# Patient Record
Sex: Female | Born: 2000 | Race: Black or African American | Hispanic: No | Marital: Single | Smoking: Former smoker
Health system: Southern US, Community
[De-identification: ages and names within clinical notes are randomized; demographics above are authoritative.]

## PROBLEM LIST (undated history)

## (undated) DIAGNOSIS — E669 Obesity, unspecified: Secondary | ICD-10-CM

## (undated) DIAGNOSIS — Z789 Other specified health status: Secondary | ICD-10-CM

## (undated) HISTORY — PX: CHOLECYSTECTOMY: SHX55

---
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
## 2020-01-10 ENCOUNTER — Emergency Department: Payer: Medicaid Other

## 2020-01-10 ENCOUNTER — Other Ambulatory Visit: Payer: Self-pay

## 2020-01-10 ENCOUNTER — Emergency Department
Admission: EM | Admit: 2020-01-10 | Discharge: 2020-01-11 | Disposition: A | Discharge status: Home / Self Care | Payer: Medicaid Other | Attending: Emergency Medicine | Admitting: Emergency Medicine

## 2020-01-10 ENCOUNTER — Encounter: Payer: Self-pay | Admitting: Emergency Medicine

## 2020-01-10 DIAGNOSIS — O219 Vomiting of pregnancy, unspecified: Secondary | ICD-10-CM

## 2020-01-10 DIAGNOSIS — K209 Esophagitis, unspecified without bleeding: Secondary | ICD-10-CM | POA: Insufficient documentation

## 2020-01-10 DIAGNOSIS — R112 Nausea with vomiting, unspecified: Secondary | ICD-10-CM | POA: Insufficient documentation

## 2020-01-10 DIAGNOSIS — R0789 Other chest pain: Secondary | ICD-10-CM | POA: Diagnosis not present

## 2020-01-10 DIAGNOSIS — N3001 Acute cystitis with hematuria: Secondary | ICD-10-CM | POA: Diagnosis not present

## 2020-01-10 LAB — URINALYSIS, COMPLETE (UACMP) WITH MICROSCOPIC
Glucose, UA: NEGATIVE mg/dL
Hgb urine dipstick: NEGATIVE
Ketones, ur: NEGATIVE mg/dL
Nitrite: NEGATIVE
Protein, ur: >=300 mg/dL — AB
Specific Gravity, Urine: 1.025 (ref 1.005–1.030)
pH: 8 (ref 5.0–8.0)

## 2020-01-10 LAB — BASIC METABOLIC PANEL
Anion gap: 14 (ref 5–15)
BUN: 10 mg/dL (ref 6–20)
CO2: 19 mmol/L — ABNORMAL LOW (ref 22–32)
Calcium: 9.8 mg/dL (ref 8.9–10.3)
Chloride: 103 mmol/L (ref 98–111)
Creatinine, Ser: 0.68 mg/dL (ref 0.44–1.00)
GFR calc Af Amer: >60 mL/min (ref >60)
GFR calc non Af Amer: >60 mL/min (ref >60)
Glucose, Bld: 96 mg/dL (ref 70–99)
Potassium: 3 mmol/L — ABNORMAL LOW (ref 3.5–5.1)
Sodium: 136 mmol/L (ref 135–145)

## 2020-01-10 LAB — CBC
HCT: 36.7 % (ref 36.0–46.0)
Hemoglobin: 12.4 g/dL (ref 12.0–15.0)
MCH: 29.5 pg (ref 26.0–34.0)
MCHC: 33.8 g/dL (ref 30.0–36.0)
MCV: 87.4 fL (ref 80.0–100.0)
Platelets: 367 10*3/uL (ref 150–400)
RBC: 4.2 MIL/uL (ref 3.87–5.11)
RDW: 13.5 % (ref 11.5–15.5)
WBC: 9.4 10*3/uL (ref 4.0–10.5)
nRBC: 0 % (ref 0.0–0.2)

## 2020-01-10 LAB — HCG, QUANTITATIVE, PREGNANCY: hCG, Beta Chain, Quant, S: 76029 m[IU]/mL — ABNORMAL HIGH (ref <5)

## 2020-01-10 LAB — TROPONIN I (HIGH SENSITIVITY)
Troponin I (High Sensitivity): <2 ng/L (ref <18)
Troponin I (High Sensitivity): 4 ng/L (ref <18)

## 2020-01-10 MED ORDER — DOXYLAMINE-PYRIDOXINE 10-10 MG PO TBEC: DELAYED_RELEASE_TABLET | ORAL | 0 refills | Status: DC

## 2020-01-10 MED ORDER — ACETAMINOPHEN 500 MG PO TABS
1000.0000 mg | ORAL_TABLET | Freq: Once | ORAL | Status: AC
  Administered 2020-01-10: 1000 mg via ORAL
  Filled 2020-01-10: qty 2

## 2020-01-10 MED ORDER — SODIUM CHLORIDE 0.9% FLUSH
3.0000 mL | Freq: Once | INTRAVENOUS | Status: AC
  Administered 2020-01-10: 23:00:00 3 mL via INTRAVENOUS

## 2020-01-10 MED ORDER — PROMETHAZINE HCL 25 MG/ML IJ SOLN
25.0000 mg | Freq: Once | INTRAMUSCULAR | Status: AC
  Administered 2020-01-10: 23:00:00 25 mg via INTRAVENOUS
  Filled 2020-01-10: qty 1

## 2020-01-10 MED ORDER — ALUM & MAG HYDROXIDE-SIMETH 200-200-20 MG/5ML PO SUSP
30.0000 mL | Freq: Once | ORAL | Status: AC
  Administered 2020-01-10: 23:00:00 30 mL via ORAL
  Filled 2020-01-10: qty 30

## 2020-01-10 MED ORDER — POTASSIUM CHLORIDE ER 10 MEQ PO TBCR: 20.0000 meq | EXTENDED_RELEASE_TABLET | Freq: Every day | ORAL | 0 refills | Status: DC

## 2020-01-10 MED ORDER — LACTATED RINGERS IV BOLUS: 1000.0000 mL | Freq: Once | INTRAVENOUS | Status: DC

## 2020-01-10 MED ORDER — CEPHALEXIN 500 MG PO CAPS: 500.0000 mg | ORAL_CAPSULE | Freq: Four times a day (QID) | ORAL | 0 refills | Status: AC

## 2020-01-10 MED ORDER — SODIUM CHLORIDE 0.9 % IV SOLN
2.0000 g | Freq: Once | INTRAVENOUS | Status: AC
  Administered 2020-01-10: 23:00:00 2 g via INTRAVENOUS
  Filled 2020-01-10: qty 20

## 2020-01-10 MED ORDER — PANTOPRAZOLE SODIUM 40 MG PO TBEC: 40.0000 mg | DELAYED_RELEASE_TABLET | Freq: Every day | ORAL | 0 refills | Status: DC

## 2020-01-10 MED ORDER — LACTATED RINGERS IV BOLUS
2000.0000 mL | Freq: Once | INTRAVENOUS | Status: AC
  Administered 2020-01-10: 23:00:00 2000 mL via INTRAVENOUS

## 2020-01-10 MED ORDER — LIDOCAINE VISCOUS HCL 2 % MT SOLN
15.0000 mL | Freq: Once | OROMUCOSAL | Status: AC
  Administered 2020-01-10: 23:00:00 15 mL via ORAL
  Filled 2020-01-10: qty 15

## 2020-01-10 MED ORDER — PANTOPRAZOLE SODIUM 40 MG IV SOLR
40.0000 mg | Freq: Once | INTRAVENOUS | Status: AC
  Administered 2020-01-11: 40 mg via INTRAVENOUS
  Filled 2020-01-10: qty 40

## 2020-01-10 NOTE — Discharge Instructions (Signed)
Take the prescribed antacids and anti-nausea medication  Try over-the-counter Tums or Rolaids first for chest pain.  You can take tylenol 1000 mg every 6 hours for pain as well.  Try to eat frequent, small meals. Avoid foods high in acid.

## 2020-01-10 NOTE — ED Triage Notes (Signed)
Pt to ED from home c/o mid chest pain for a couple days non radiating, SOB, nausea and vomiting x4 today.  Pt states [redacted] weeks pregnant with first pregnancy.  A&Ox4, chest rise even and unlabored, in NAD at this time.

## 2020-01-10 NOTE — ED Notes (Signed)
Patient states she experiences indigestion and has taken multiple different treatments for this while at home with no relief. Patient states this causes N/V and SOB. Reports limited and decreased intake for the last several days. Patient states that her urine is darker than normal. Denies any complications or complaints with her pregnancy

## 2020-01-10 NOTE — ED Notes (Signed)
Attempted fetal heart tones in triage.

## 2020-01-10 NOTE — ED Provider Notes (Signed)
[facility] Emergency Department Provider Note  ____________________________________________   First MD Initiated Contact with Patient 01/10/20 2153     (approximate)  I have reviewed the triage vital signs and the nursing notes.   HISTORY  Chief Complaint Chest Pain    HPI Joanne Pacheco is a 19 y.o. female , G1P0 at estimated [redacted] wk GA here with nausea, vomiting, Cp. Pt reports that for the past several weeks, she's had persistent, severe nausea and vomiting. For the past few days, she's had worsening n/v and has subsequently developed an aching, burning substernal and epigastric discomfort that is worse with vomiting and movement. She has had difficulty keeping food down due to this. She has not had any leg swelling. No h/o DVT or PE. She also feels like her CP is worse lyign down and bending over, and that it seems worse when she is working.        History reviewed. No pertinent past medical history.  There are no problems to display for this patient.   History reviewed. No pertinent surgical history.  Prior to Admission medications   Not on File    Allergies Patient has no known allergies.  History reviewed. No pertinent family history.  Social History Social History   Tobacco Use  . Smoking status: Never Smoker  . Smokeless tobacco: Never Used  Substance Use Topics  . Alcohol use: Never  . Drug use: Never    Review of Systems  Review of Systems  Constitutional: Positive for fatigue. Negative for fever.  HENT: Negative for congestion and sore throat.   Eyes: Negative for visual disturbance.  Respiratory: Positive for chest tightness. Negative for cough and shortness of breath.   Cardiovascular: Positive for chest pain.  Gastrointestinal: Positive for nausea and vomiting. Negative for abdominal pain and diarrhea.  Genitourinary: Negative for flank pain.  Musculoskeletal: Negative for back pain and neck pain.  Skin: Negative for  rash and wound.  Neurological: Positive for weakness.  All other systems reviewed and are negative.    ____________________________________________  PHYSICAL EXAM:      VITAL SIGNS: ED Triage Vitals [01/10/20 1916]  Enc Vitals Group     BP (!) 137/93     Pulse Rate 91     Resp 18     Temp 98.8 F (37.1 C)     Temp Source Oral     SpO2 99 %     Weight 286 lb (129.7 kg)     Height 5\' 6"  (1.676 m)     Head Circumference      Peak Flow      Pain Score 5     Pain Loc      Pain Edu?      Excl. in Sac?      Physical Exam Vitals and nursing note reviewed.  Constitutional:      General: She is not in acute distress.    Appearance: She is well-developed.  HENT:     Head: Normocephalic and atraumatic.     Comments: Dry MM Eyes:     Conjunctiva/sclera: Conjunctivae normal.  Cardiovascular:     Rate and Rhythm: Normal rate and regular rhythm.     Heart sounds: Normal heart sounds.  Pulmonary:     Effort: Pulmonary effort is normal. No respiratory distress.     Breath sounds: No wheezing.  Abdominal:     General: Abdomen is flat. There is no distension.     Tenderness: There is  abdominal tenderness (minimal epigastric, no RUQ TTP).  Musculoskeletal:     Cervical back: Neck supple.  Skin:    General: Skin is warm.     Capillary Refill: Capillary refill takes less than 2 seconds.     Findings: No rash.  Neurological:     Mental Status: She is alert and oriented to person, place, and time.     Motor: No abnormal muscle tone.       ____________________________________________   LABS (all labs ordered are listed, but only abnormal results are displayed)  Labs Reviewed  BASIC METABOLIC PANEL - Abnormal; Notable for the following components:      Result Value   Potassium 3.0 (*)    CO2 19 (*)    All other components within normal limits  URINALYSIS, COMPLETE (UACMP) WITH MICROSCOPIC - Abnormal; Notable for the following components:   Color, Urine AMBER (*)     APPearance TURBID (*)    Bilirubin Urine SMALL (*)    Protein, ur >=300 (*)    Leukocytes,Ua SMALL (*)    Bacteria, UA MANY (*)    All other components within normal limits  HCG, QUANTITATIVE, PREGNANCY - Abnormal; Notable for the following components:   hCG, Beta Chain, Quant, S 76,029 (*)    All other components within normal limits  CBC  TROPONIN I (HIGH SENSITIVITY)  TROPONIN I (HIGH SENSITIVITY)    ____________________________________________  EKG: Normal sinus rhythm, VR 82. PR 132, QRS 90, QTc 406. No acute St elevations or depressions. ________________________________________  RADIOLOGY All imaging, including plain films, CT scans, and ultrasounds, independently reviewed by me, and interpretations confirmed via formal radiology reads.  ED MD interpretation:   CXR: Clear  Official radiology report(s): DG Chest 2 View  Result Date: 01/10/2020 CLINICAL DATA:  Mid chest pain for several days, nonradiating, short of breath, nausea and vomiting, [redacted] weeks pregnant EXAM: CHEST - 2 VIEW COMPARISON:  None. FINDINGS: The heart size and mediastinal contours are within normal limits. Both lungs are clear. The visualized skeletal structures are unremarkable. IMPRESSION: No active cardiopulmonary disease. Electronically Signed   By: Sharlet Salina M.D.   On: 01/10/2020 19:59    ____________________________________________  PROCEDURES   Procedure(s) performed (including Critical Care):  Procedures  ____________________________________________  INITIAL IMPRESSION / MDM / ASSESSMENT AND PLAN / ED COURSE  As part of my medical decision making, I reviewed the following data within the electronic MEDICAL RECORD NUMBER Nursing notes reviewed and incorporated, Old chart reviewed, Notes from prior ED visits, and [place] Controlled Substance Database       *Joanne Pacheco was evaluated in Emergency Department on 01/10/2020 for the symptoms described in the history of present illness. She was  evaluated in the context of the global COVID-19 pandemic, which necessitated consideration that the patient might be at risk for infection with the SARS-CoV-2 virus that causes COVID-19. Institutional protocols and algorithms that pertain to the evaluation of patients at risk for COVID-19 are in a state of rapid change based on information released by regulatory bodies including the CDC and federal and state organizations. These policies and algorithms were followed during the patient's care in the ED.  Some ED evaluations and interventions may be delayed as a result of limited staffing during the pandemic.*     Medical Decision Making:  19 yo F here with epigastric and substernal CP, nausea, vomiting. Suspect gastritis/esophagitis in setting of persistent nausea/vomiting of pregnancy. Hgb is stable, she denies any overt hematemesis and I see  no signs to suggest MW tear or PUD. Her EKG is non-ischemic and trop neg x 2 despite constant sx, with no personal or family h/o early CAD - do not suspect ACS. She has no LE edema, calf pain, tachycardia, tachypnea, hypoxia, or signs of DVT/PE.  Will treat with antacids, antiemetics, and fluids. Of note, pt had reported darkened urine and UA is c/f UTI. Given +pregnancy, wil tx with Rocephin and outpt Keflex. No CVAT or signs of pyelo.  Plan to reassess after fluids, symptom control. Likely d/c with outpt follow-up.  ____________________________________________  FINAL CLINICAL IMPRESSION(S) / ED DIAGNOSES  Final diagnoses:  Nausea/vomiting in pregnancy  Atypical chest pain  Esophagitis  Acute cystitis with hematuria     MEDICATIONS GIVEN DURING THIS VISIT:  Medications  cefTRIAXone (ROCEPHIN) 2 g in sodium chloride 0.9 % 100 mL IVPB (2 g Intravenous New Bag/Given 01/10/20 2307)  acetaminophen (TYLENOL) tablet 1,000 mg (has no administration in time range)  sodium chloride flush (NS) 0.9 % injection 3 mL (3 mLs Intravenous Given 01/10/20 2250)  alum &  mag hydroxide-simeth (MAALOX/MYLANTA) 200-200-20 MG/5ML suspension 30 mL (30 mLs Oral Given 01/10/20 2304)    And  lidocaine (XYLOCAINE) 2 % viscous mouth solution 15 mL (15 mLs Oral Given 01/10/20 2304)  promethazine (PHENERGAN) injection 25 mg (25 mg Intravenous Given 01/10/20 2303)  lactated ringers bolus 2,000 mL (2,000 mLs Intravenous Other (enter comment in med admin window) 01/10/20 2254)     ED Discharge Orders    None       Note:  This document was prepared using Dragon voice recognition software and may include unintentional dictation errors.   Shaune Pollack, MD 01/10/20 [phone]

## 2020-01-11 NOTE — ED Notes (Signed)
Patient resting in bed, states she still does not feel well. Patient requests lights dimmed for rest. Call light in reach, will continue to monitor.

## 2020-01-11 NOTE — ED Notes (Signed)
Patient resting quietly in bed at this time. Patient has approx 200cc of LR remaining in bag at this time

## 2020-01-11 NOTE — ED Provider Notes (Signed)
-----------------------------------------   1:56 AM on 01/11/2020 -----------------------------------------   Blood pressure 126/73, pulse (!) 57, temperature 98.1 F (36.7 C), temperature source Oral, resp. rate (!) 23, height 5\' 6"  (1.676 m), weight 129.7 kg, SpO2 100 %.  Assuming care from Dr. of Shauntel Prest is a 19 y.o. female with a chief complaint of Chest Pain .    Please refer to H&P by previous MD for further details.  The current plan of care is to reassess after meds and IVF.   Patient reports feeling markedly improved with full resolution of her symptoms, tolerating p.o. with no further episodes of vomiting.  Will discharge home with prescriptions and instructions left by Dr. 12.  Recommended follow-up with OB/GYN and discussed my standard return precautions        Erma Heritage, MD 01/11/20 0157

## 2020-01-13 LAB — URINE CULTURE: Culture: >=100000 — AB

## 2020-01-29 ENCOUNTER — Emergency Department
Admission: EM | Admit: 2020-01-29 | Discharge: 2020-01-29 | Disposition: A | Discharge status: Home / Self Care | Payer: Medicaid Other | Attending: Student in an Organized Health Care Education/Training Program | Admitting: Student in an Organized Health Care Education/Training Program

## 2020-01-29 ENCOUNTER — Emergency Department: Payer: Medicaid Other

## 2020-01-29 ENCOUNTER — Other Ambulatory Visit: Payer: Self-pay

## 2020-01-29 ENCOUNTER — Encounter: Payer: Self-pay | Admitting: Emergency Medicine

## 2020-01-29 DIAGNOSIS — Z3A14 14 weeks gestation of pregnancy: Secondary | ICD-10-CM | POA: Diagnosis not present

## 2020-01-29 DIAGNOSIS — O99891 Other specified diseases and conditions complicating pregnancy: Secondary | ICD-10-CM | POA: Diagnosis not present

## 2020-01-29 DIAGNOSIS — R0789 Other chest pain: Secondary | ICD-10-CM | POA: Diagnosis not present

## 2020-01-29 DIAGNOSIS — R1013 Epigastric pain: Secondary | ICD-10-CM | POA: Diagnosis not present

## 2020-01-29 DIAGNOSIS — Z20822 Contact with and (suspected) exposure to covid-19: Secondary | ICD-10-CM | POA: Diagnosis not present

## 2020-01-29 DIAGNOSIS — Z79899 Other long term (current) drug therapy: Secondary | ICD-10-CM | POA: Diagnosis not present

## 2020-01-29 DIAGNOSIS — R110 Nausea: Secondary | ICD-10-CM

## 2020-01-29 LAB — HEPATIC FUNCTION PANEL
ALT: 93 U/L — ABNORMAL HIGH (ref 0–44)
AST: 75 U/L — ABNORMAL HIGH (ref 15–41)
Albumin: 3.8 g/dL (ref 3.5–5.0)
Alkaline Phosphatase: 54 U/L (ref 38–126)
Bilirubin, Direct: 0.4 mg/dL — ABNORMAL HIGH (ref 0.0–0.2)
Indirect Bilirubin: 0.7 mg/dL (ref 0.3–0.9)
Total Bilirubin: 1.1 mg/dL (ref 0.3–1.2)
Total Protein: 7.8 g/dL (ref 6.5–8.1)

## 2020-01-29 LAB — BASIC METABOLIC PANEL
Anion gap: 9 (ref 5–15)
BUN: 7 mg/dL (ref 6–20)
CO2: 20 mmol/L — ABNORMAL LOW (ref 22–32)
Calcium: 9.5 mg/dL (ref 8.9–10.3)
Chloride: 105 mmol/L (ref 98–111)
Creatinine, Ser: 0.43 mg/dL — ABNORMAL LOW (ref 0.44–1.00)
GFR calc Af Amer: >60 mL/min (ref >60)
GFR calc non Af Amer: >60 mL/min (ref >60)
Glucose, Bld: 92 mg/dL (ref 70–99)
Potassium: 3.4 mmol/L — ABNORMAL LOW (ref 3.5–5.1)
Sodium: 134 mmol/L — ABNORMAL LOW (ref 135–145)

## 2020-01-29 LAB — LIPASE, BLOOD: Lipase: 23 U/L (ref 11–51)

## 2020-01-29 LAB — CBC
HCT: 35.6 % — ABNORMAL LOW (ref 36.0–46.0)
Hemoglobin: 12.3 g/dL (ref 12.0–15.0)
MCH: 29.7 pg (ref 26.0–34.0)
MCHC: 34.6 g/dL (ref 30.0–36.0)
MCV: 86 fL (ref 80.0–100.0)
Platelets: 394 10*3/uL (ref 150–400)
RBC: 4.14 MIL/uL (ref 3.87–5.11)
RDW: 13.5 % (ref 11.5–15.5)
WBC: 8.4 10*3/uL (ref 4.0–10.5)
nRBC: 0 % (ref 0.0–0.2)

## 2020-01-29 LAB — TROPONIN I (HIGH SENSITIVITY): Troponin I (High Sensitivity): 5 ng/L (ref <18)

## 2020-01-29 LAB — HCG, QUANTITATIVE, PREGNANCY: hCG, Beta Chain, Quant, S: 46082 m[IU]/mL — ABNORMAL HIGH (ref <5)

## 2020-01-29 LAB — POC SARS CORONAVIRUS 2 AG: SARS Coronavirus 2 Ag: NEGATIVE

## 2020-01-29 LAB — GROUP A STREP BY PCR: Group A Strep by PCR: NOT DETECTED

## 2020-01-29 MED ORDER — ONDANSETRON HCL 4 MG/2ML IJ SOLN
4.0000 mg | Freq: Once | INTRAMUSCULAR | Status: AC
  Administered 2020-01-29: 4 mg via INTRAVENOUS
  Filled 2020-01-29: qty 2

## 2020-01-29 MED ORDER — SODIUM CHLORIDE 0.9 % IV BOLUS
1000.0000 mL | Freq: Once | INTRAVENOUS | Status: AC
  Administered 2020-01-29: 1000 mL via INTRAVENOUS

## 2020-01-29 MED ORDER — ONDANSETRON HCL 4 MG PO TABS: 4.0000 mg | ORAL_TABLET | Freq: Every day | ORAL | 0 refills | Status: DC | PRN

## 2020-01-29 NOTE — ED Triage Notes (Addendum)
Pt arrival via [facility] from home due to chest pain, dizziness, n/v, and shob. Pt states she's been feeling fatigued and has been weaker than usual and not eating/drinking like normal. Pt also has been sneezing. Pt was here 2 weeks ago for similar chest pain/shob.  Pt is a&ox4, denies fevers at home, and denies diarrhea. Pt denies having covid contacts but has not been tested.   Pt states she is also [redacted] weeks pregnant.

## 2020-01-29 NOTE — ED Notes (Signed)
Called lab and Marylene Land will add on labs that were not added on when called earlier.

## 2020-01-29 NOTE — ED Triage Notes (Signed)
Pt comes into the ED via EMS from home with c/o dizziness, N/V headache , SOB and chest pain states she is [redacted] weeks pregnant and was just seen here recent dx with UTI and stomach ulcer , prescribed abx.

## 2020-01-29 NOTE — ED Notes (Signed)
Pt brought in via ems from home with chest discomfort, low abd pain.  Pt is approx [redacted] weeks pregnant.  No vag bleeding.    Chest discomfort for past 2 days with sob.  Pt also reports nausea   No vomiting.  Nonsmoker.  Iv in place.  Pt alert.

## 2020-01-29 NOTE — ED Notes (Signed)
Lab to add on new blood orders

## 2020-01-29 NOTE — Discharge Instructions (Signed)

## 2020-01-29 NOTE — ED Provider Notes (Signed)
[facility] Emergency Department Provider Note    First MD Initiated Contact with Patient 01/29/20 1528     (approximate)  I have reviewed the triage vital signs and the nursing notes.   HISTORY  Chief Complaint Chest Pain and Nausea    HPI Joanne Pacheco is a 19 y.o. female states that she is roughly [redacted] weeks pregnant presents to the ER for evaluation of cough congestion some chest discomfort and shortness of breath as well as some pelvic discomfort.  Recently given antibiotics for UTI.  The symptoms are improving.  Denies any vaginal bleeding or discharge.  No measured fevers but has had some chills.  Does have a scratchy throat.  Does have a history of seasonal allergies.  No history of asthma.  Does not have any chest pain when taking deep inspiration.  Is not been tested for Covid.    History reviewed. No pertinent past medical history. History reviewed. No pertinent family history. History reviewed. No pertinent surgical history. There are no problems to display for this patient.     Prior to Admission medications   Medication Sig Start Date End Date Taking? Authorizing Provider  Doxylamine-Pyridoxine (DICLEGIS) 10-10 MG TBEC Take two tablets at bedtime on day 1 and 2; if symptoms persist, take 1 tab in AM and 2 tabs at bedtime day 3, then 1 tab every 6 hr PRN 01/10/20   Duffy Bruce, MD  ondansetron (ZOFRAN) 4 MG tablet Take 1 tablet (4 mg total) by mouth daily as needed. 01/29/20 01/28/21  Merlyn Lot, MD  pantoprazole (PROTONIX) 40 MG tablet Take 1 tablet (40 mg total) by mouth daily for 7 days. 01/10/20 01/17/20  Duffy Bruce, MD  potassium chloride (KLOR-CON) 10 MEQ tablet Take 2 tablets (20 mEq total) by mouth daily for 5 days. 01/10/20 01/15/20  Duffy Bruce, MD  Prenatal Vit-Fe Fumarate-FA (PRENATAL MULTIVITAMIN) TABS tablet Take 1 tablet by mouth daily at 12 noon.    [provider]    Allergies Patient has no known  allergies.    Social History Social History   Tobacco Use  . Smoking status: Never Smoker  . Smokeless tobacco: Never Used  Substance Use Topics  . Alcohol use: Never  . Drug use: Never    Review of Systems Patient denies headaches, rhinorrhea, blurry vision, numbness, shortness of breath, chest pain, edema, cough, abdominal pain, nausea, vomiting, diarrhea, dysuria, fevers, rashes or hallucinations unless otherwise stated above in HPI. ____________________________________________   PHYSICAL EXAM:  VITAL SIGNS: Vitals:   01/29/20 1306 01/29/20 2002  BP: 137/87 110/79  Pulse: 83 72  Resp: 18 16  Temp: 99.2 F (37.3 C)   SpO2: 97% 100%    Constitutional: Alert and oriented.  Eyes: Conjunctivae are normal.  Head: Atraumatic. Nose: No congestion/rhinnorhea. Mouth/Throat: Mucous membranes are moist.  Posterior oropharynx with cobblestoning, no exudates,  Uvula midline, no pta Neck: No stridor. Painless ROM.  Cardiovascular: Normal rate, regular rhythm. Grossly normal heart sounds.  Good peripheral circulation. Respiratory: Normal respiratory effort.  No retractions. Lungs CTAB. Gastrointestinal: Soft and nontender. No distention. No abdominal bruits. No CVA tenderness. Genitourinary:  Musculoskeletal: No lower extremity tenderness nor edema.  No joint effusions. Neurologic:  Normal speech and language. No gross focal neurologic deficits are appreciated. No facial droop Skin:  Skin is warm, dry and intact. No rash noted. Psychiatric: Mood and affect are normal. Speech and behavior are normal.  ____________________________________________   LABS (all labs ordered are listed, but only  abnormal results are displayed)  Results for orders placed or performed during the hospital encounter of 01/29/20 (from the past 24 hour(s))  Basic metabolic panel     Status: Abnormal   Collection Time: 01/29/20  1:15 PM  Result Value Ref Range   Sodium 134 (L) 135 - 145 mmol/L    Potassium 3.4 (L) 3.5 - 5.1 mmol/L   Chloride 105 98 - 111 mmol/L   CO2 20 (L) 22 - 32 mmol/L   Glucose, Bld 92 70 - 99 mg/dL   BUN 7 6 - 20 mg/dL   Creatinine, Ser 3.15 (L) 0.44 - 1.00 mg/dL   Calcium 9.5 8.9 - 40.0 mg/dL   GFR calc non Af Amer >60 >60 mL/min   GFR calc Af Amer >60 >60 mL/min   Anion gap 9 5 - 15  CBC     Status: Abnormal   Collection Time: 01/29/20  1:15 PM  Result Value Ref Range   WBC 8.4 4.0 - 10.5 K/uL   RBC 4.14 3.87 - 5.11 MIL/uL   Hemoglobin 12.3 12.0 - 15.0 g/dL   HCT 86.7 (L) 61.9 - 50.9 %   MCV 86.0 80.0 - 100.0 fL   MCH 29.7 26.0 - 34.0 pg   MCHC 34.6 30.0 - 36.0 g/dL   RDW 32.6 71.2 - 45.8 %   Platelets 394 150 - 400 K/uL   nRBC 0.0 0.0 - 0.2 %  Troponin I (High Sensitivity)     Status: None   Collection Time: 01/29/20  1:15 PM  Result Value Ref Range   Troponin I (High Sensitivity) 5 <18 ng/L  hCG, quantitative, pregnancy     Status: Abnormal   Collection Time: 01/29/20  1:15 PM  Result Value Ref Range   hCG, Beta Chain, Quant, S 46,082 (H) <5 mIU/mL  Hepatic function panel     Status: Abnormal   Collection Time: 01/29/20  1:15 PM  Result Value Ref Range   Total Protein 7.8 6.5 - 8.1 g/dL   Albumin 3.8 3.5 - 5.0 g/dL   AST 75 (H) 15 - 41 U/L   ALT 93 (H) 0 - 44 U/L   Alkaline Phosphatase 54 38 - 126 U/L   Total Bilirubin 1.1 0.3 - 1.2 mg/dL   Bilirubin, Direct 0.4 (H) 0.0 - 0.2 mg/dL   Indirect Bilirubin 0.7 0.3 - 0.9 mg/dL  Lipase, blood     Status: None   Collection Time: 01/29/20  1:15 PM  Result Value Ref Range   Lipase 23 11 - 51 U/L  Group A Strep by PCR ([facility] Only)     Status: None   Collection Time: 01/29/20  3:46 PM   Specimen: Throat; Sterile Swab  Result Value Ref Range   Group A Strep by PCR NOT DETECTED NOT DETECTED  POC SARS Coronavirus 2 Ag     Status: None   Collection Time: 01/29/20  4:03 PM  Result Value Ref Range   SARS Coronavirus 2 Ag NEGATIVE NEGATIVE   ____________________________________________  EKG My  review and personal interpretation at Time: 13:07   Indication: chest pain  Rate: 80  Rhythm: sinus Axis: normal Other: normal intervals, no stemi ____________________________________________  RADIOLOGY  I personally reviewed all radiographic images ordered to evaluate for the above acute complaints and reviewed radiology reports and findings.  These findings were personally discussed with the patient.  Please see medical record for radiology report.  ____________________________________________   PROCEDURES  Procedure(s) performed:  Procedures  Critical Care performed: no ____________________________________________   INITIAL IMPRESSION / ASSESSMENT AND PLAN / ED COURSE  Pertinent labs & imaging results that were available during my care of the patient were reviewed by me and considered in my medical decision making (see chart for details).   DDX: uri, covid, pna, sinusitis, strep  Verlene Glantz is a 19 y.o. who presents to the ED with symptoms as described above.  Patient clinically very well-appearing in no acute distress.  Does have some mild epigastric pain.  No lower abdominal pain.  Does have some symptoms of URI.  Possible seasonal allergies.  Not seeing any signs or symptoms of sepsis.  Does not seem consistent with ACS, PE, CHF pneumothorax.  Blood will be ordered for above differential.  She has not had pelvic ultrasound will order that does sure there is no complication related pregnancy that could be causing some of her abdominal pain.  Provide IV fluids and antiemetic as it seems most likely much of her symptoms are secondary to nausea and vomiting.  Clinical Course as of Jan 29 2115  Mon Jan 29, 2020  1948 LFTs just resulted are mildly elevated.  No evidence of biliary obstruction.  Does have some right upper quadrant abdominal pain given her low-grade temperature will order ultrasound to exclude cholecystitis.  States she otherwise feels much improved after IV  fluids.   [PR]  2111 Right upper quadrant ultrasound is reassuring.  Repeat exam is also reassuring.  At this point I do believe she is stable and appropriate for outpatient follow-up.  To help coordinate close outpatient follow-up I did discuss case w with Dr. Valentino Saxon of OB/GYN.  Patient will call to make a close outpatient follow-up for repeat labs and clinic evaluation.  We discussed signs and symptoms for which she should return to the ER.   [PR]    Clinical Course User Index [PR] Willy Eddy, MD    The patient was evaluated in Emergency Department today for the symptoms described in the history of present illness. He/she was evaluated in the context of the global COVID-19 pandemic, which necessitated consideration that the patient might be at risk for infection with the SARS-CoV-2 virus that causes COVID-19. Institutional protocols and algorithms that pertain to the evaluation of patients at risk for COVID-19 are in a state of rapid change based on information released by regulatory bodies including the CDC and federal and state organizations. These policies and algorithms were followed during the patient's care in the ED.  As part of my medical decision making, I reviewed the following data within the electronic MEDICAL RECORD NUMBER Nursing notes reviewed and incorporated, Labs reviewed, notes from prior ED visits and [place] Controlled Substance Database   ____________________________________________   FINAL CLINICAL IMPRESSION(S) / ED DIAGNOSES  Final diagnoses:  Nausea  Atypical chest pain  Epigastric pain      NEW MEDICATIONS STARTED DURING THIS VISIT:  New Prescriptions   ONDANSETRON (ZOFRAN) 4 MG TABLET    Take 1 tablet (4 mg total) by mouth daily as needed.     Note:  This document was prepared using Dragon voice recognition software and may include unintentional dictation errors.    Willy Eddy, MD 01/29/20 2116

## 2020-02-08 ENCOUNTER — Other Ambulatory Visit: Payer: Self-pay

## 2020-02-08 ENCOUNTER — Emergency Department: Payer: Medicaid Other

## 2020-02-08 ENCOUNTER — Inpatient Hospital Stay
Admission: EM | Admit: 2020-02-08 | Discharge: 2020-02-11 | DRG: 832 | Disposition: A | Discharge status: Home / Self Care | Payer: Medicaid Other | Attending: Obstetrics and Gynecology | Admitting: Obstetrics and Gynecology

## 2020-02-08 DIAGNOSIS — O210 Mild hyperemesis gravidarum: Secondary | ICD-10-CM | POA: Diagnosis present

## 2020-02-08 DIAGNOSIS — Z3490 Encounter for supervision of normal pregnancy, unspecified, unspecified trimester: Secondary | ICD-10-CM

## 2020-02-08 DIAGNOSIS — O211 Hyperemesis gravidarum with metabolic disturbance: Secondary | ICD-10-CM | POA: Diagnosis present

## 2020-02-08 DIAGNOSIS — Z3A16 16 weeks gestation of pregnancy: Secondary | ICD-10-CM

## 2020-02-08 DIAGNOSIS — E669 Obesity, unspecified: Secondary | ICD-10-CM | POA: Diagnosis present

## 2020-02-08 DIAGNOSIS — R7401 Elevation of levels of liver transaminase levels: Secondary | ICD-10-CM | POA: Diagnosis not present

## 2020-02-08 DIAGNOSIS — R1110 Vomiting, unspecified: Secondary | ICD-10-CM | POA: Diagnosis present

## 2020-02-08 DIAGNOSIS — R7989 Other specified abnormal findings of blood chemistry: Secondary | ICD-10-CM | POA: Diagnosis present

## 2020-02-08 DIAGNOSIS — R0781 Pleurodynia: Secondary | ICD-10-CM | POA: Diagnosis not present

## 2020-02-08 DIAGNOSIS — E876 Hypokalemia: Secondary | ICD-10-CM | POA: Diagnosis not present

## 2020-02-08 DIAGNOSIS — R1013 Epigastric pain: Secondary | ICD-10-CM | POA: Diagnosis not present

## 2020-02-08 DIAGNOSIS — K920 Hematemesis: Secondary | ICD-10-CM | POA: Diagnosis present

## 2020-02-08 DIAGNOSIS — R1011 Right upper quadrant pain: Secondary | ICD-10-CM

## 2020-02-08 DIAGNOSIS — K819 Cholecystitis, unspecified: Secondary | ICD-10-CM | POA: Diagnosis not present

## 2020-02-08 DIAGNOSIS — O99212 Obesity complicating pregnancy, second trimester: Secondary | ICD-10-CM | POA: Diagnosis present

## 2020-02-08 DIAGNOSIS — R0682 Tachypnea, not elsewhere classified: Secondary | ICD-10-CM | POA: Diagnosis present

## 2020-02-08 DIAGNOSIS — K8010 Calculus of gallbladder with chronic cholecystitis without obstruction: Secondary | ICD-10-CM | POA: Diagnosis present

## 2020-02-08 DIAGNOSIS — R945 Abnormal results of liver function studies: Secondary | ICD-10-CM | POA: Diagnosis not present

## 2020-02-08 DIAGNOSIS — O0932 Supervision of pregnancy with insufficient antenatal care, second trimester: Secondary | ICD-10-CM

## 2020-02-08 DIAGNOSIS — Z20822 Contact with and (suspected) exposure to covid-19: Secondary | ICD-10-CM | POA: Diagnosis present

## 2020-02-08 DIAGNOSIS — O26612 Liver and biliary tract disorders in pregnancy, second trimester: Secondary | ICD-10-CM | POA: Diagnosis present

## 2020-02-08 DIAGNOSIS — M940 Chondrocostal junction syndrome [Tietze]: Secondary | ICD-10-CM | POA: Diagnosis not present

## 2020-02-08 HISTORY — DX: Vomiting, unspecified: R11.10

## 2020-02-08 HISTORY — DX: Obesity, unspecified: E66.9

## 2020-02-08 LAB — RESPIRATORY PANEL BY RT PCR (FLU A&B, COVID)
Influenza A by PCR: NEGATIVE
Influenza B by PCR: NEGATIVE
SARS Coronavirus 2 by RT PCR: NEGATIVE

## 2020-02-08 LAB — COMPREHENSIVE METABOLIC PANEL
ALT: 156 U/L — ABNORMAL HIGH (ref 0–44)
ALT: 145 U/L — ABNORMAL HIGH (ref 0–44)
AST: 111 U/L — ABNORMAL HIGH (ref 15–41)
AST: 104 U/L — ABNORMAL HIGH (ref 15–41)
Albumin: 3.9 g/dL (ref 3.5–5.0)
Albumin: 3.6 g/dL (ref 3.5–5.0)
Alkaline Phosphatase: 71 U/L (ref 38–126)
Alkaline Phosphatase: 66 U/L (ref 38–126)
Anion gap: 11 (ref 5–15)
Anion gap: 11 (ref 5–15)
BUN: 8 mg/dL (ref 6–20)
BUN: 6 mg/dL (ref 6–20)
CO2: 21 mmol/L — ABNORMAL LOW (ref 22–32)
CO2: 23 mmol/L (ref 22–32)
Calcium: 9.4 mg/dL (ref 8.9–10.3)
Calcium: 9.2 mg/dL (ref 8.9–10.3)
Chloride: 104 mmol/L (ref 98–111)
Chloride: 103 mmol/L (ref 98–111)
Creatinine, Ser: 0.4 mg/dL — ABNORMAL LOW (ref 0.44–1.00)
Creatinine, Ser: 0.46 mg/dL (ref 0.44–1.00)
GFR calc Af Amer: >60 mL/min (ref >60)
GFR calc Af Amer: >60 mL/min (ref >60)
GFR calc non Af Amer: >60 mL/min (ref >60)
GFR calc non Af Amer: >60 mL/min (ref >60)
Glucose, Bld: 97 mg/dL (ref 70–99)
Glucose, Bld: 88 mg/dL (ref 70–99)
Potassium: 2.6 mmol/L — CL (ref 3.5–5.1)
Potassium: 2.9 mmol/L — ABNORMAL LOW (ref 3.5–5.1)
Sodium: 136 mmol/L (ref 135–145)
Sodium: 137 mmol/L (ref 135–145)
Total Bilirubin: 1 mg/dL (ref 0.3–1.2)
Total Bilirubin: 1.1 mg/dL (ref 0.3–1.2)
Total Protein: 8.3 g/dL — ABNORMAL HIGH (ref 6.5–8.1)
Total Protein: 7.5 g/dL (ref 6.5–8.1)

## 2020-02-08 LAB — CBC
HCT: 35.6 % — ABNORMAL LOW (ref 36.0–46.0)
HCT: 32.4 % — ABNORMAL LOW (ref 36.0–46.0)
Hemoglobin: 12.5 g/dL (ref 12.0–15.0)
Hemoglobin: 11.2 g/dL — ABNORMAL LOW (ref 12.0–15.0)
MCH: 29.9 pg (ref 26.0–34.0)
MCH: 30.1 pg (ref 26.0–34.0)
MCHC: 35.1 g/dL (ref 30.0–36.0)
MCHC: 34.6 g/dL (ref 30.0–36.0)
MCV: 85.2 fL (ref 80.0–100.0)
MCV: 87.1 fL (ref 80.0–100.0)
Platelets: 393 10*3/uL (ref 150–400)
Platelets: 321 10*3/uL (ref 150–400)
RBC: 4.18 MIL/uL (ref 3.87–5.11)
RBC: 3.72 MIL/uL — ABNORMAL LOW (ref 3.87–5.11)
RDW: 13.2 % (ref 11.5–15.5)
RDW: 13.2 % (ref 11.5–15.5)
WBC: 10.5 10*3/uL (ref 4.0–10.5)
WBC: 10.1 10*3/uL (ref 4.0–10.5)
nRBC: 0 % (ref 0.0–0.2)
nRBC: 0 % (ref 0.0–0.2)

## 2020-02-08 LAB — URINALYSIS, ROUTINE W REFLEX MICROSCOPIC
Bacteria, UA: NONE SEEN
Bilirubin Urine: NEGATIVE
Glucose, UA: NEGATIVE mg/dL
Hgb urine dipstick: NEGATIVE
Ketones, ur: 80 mg/dL — AB
Nitrite: NEGATIVE
Protein, ur: 30 mg/dL — AB
Specific Gravity, Urine: >1.046 — ABNORMAL HIGH (ref 1.005–1.030)
pH: 6 (ref 5.0–8.0)

## 2020-02-08 LAB — ACETAMINOPHEN LEVEL: Acetaminophen (Tylenol), Serum: <10 ug/mL — ABNORMAL LOW (ref 10–30)

## 2020-02-08 LAB — BLOOD GAS, ARTERIAL
Acid-Base Excess: 1.5 mmol/L (ref 0.0–2.0)
Bicarbonate: 24.7 mmol/L (ref 20.0–28.0)
FIO2: 0.21
O2 Saturation: 97.4 %
Patient temperature: 37
pCO2 arterial: 34 mmHg (ref 32.0–48.0)
pH, Arterial: 7.47 — ABNORMAL HIGH (ref 7.350–7.450)
pO2, Arterial: 89 mmHg (ref 83.0–108.0)

## 2020-02-08 LAB — HEPATITIS PANEL, ACUTE
HCV Ab: NONREACTIVE
Hep A IgM: NONREACTIVE
Hep B C IgM: NONREACTIVE
Hepatitis B Surface Ag: NONREACTIVE

## 2020-02-08 LAB — BRAIN NATRIURETIC PEPTIDE: B Natriuretic Peptide: 39 pg/mL (ref 0.0–100.0)

## 2020-02-08 LAB — TROPONIN I (HIGH SENSITIVITY): Troponin I (High Sensitivity): 5 ng/L (ref <18)

## 2020-02-08 LAB — PROTIME-INR
INR: 1 (ref 0.8–1.2)
Prothrombin Time: 13.1 seconds (ref 11.4–15.2)

## 2020-02-08 LAB — TYPE AND SCREEN
ABO/RH(D): O POS
Antibody Screen: NEGATIVE

## 2020-02-08 LAB — LACTIC ACID, PLASMA
Lactic Acid, Venous: 1.4 mmol/L (ref 0.5–1.9)
Lactic Acid, Venous: 1.1 mmol/L (ref 0.5–1.9)

## 2020-02-08 LAB — SALICYLATE LEVEL: Salicylate Lvl: <7 mg/dL — ABNORMAL LOW (ref 7.0–30.0)

## 2020-02-08 LAB — LIPASE, BLOOD: Lipase: 40 U/L (ref 11–51)

## 2020-02-08 LAB — HEPATITIS B CORE ANTIBODY, IGM: Hep B C IgM: NONREACTIVE

## 2020-02-08 LAB — MAGNESIUM: Magnesium: 2.2 mg/dL (ref 1.7–2.4)

## 2020-02-08 MED ORDER — PRENATAL MULTIVITAMIN CH
1.0000 | ORAL_TABLET | Freq: Every day | ORAL | Status: DC
  Administered 2020-02-09: 1 via ORAL
  Filled 2020-02-08: qty 1

## 2020-02-08 MED ORDER — DOXYLAMINE-PYRIDOXINE 10-10 MG PO TBEC: 1.0000 | DELAYED_RELEASE_TABLET | Freq: Two times a day (BID) | ORAL | Status: DC

## 2020-02-08 MED ORDER — VITAMIN B-6 50 MG PO TABS
25.0000 mg | ORAL_TABLET | Freq: Two times a day (BID) | ORAL | Status: DC
  Administered 2020-02-08 – 2020-02-11 (×6): 25 mg via ORAL
  Filled 2020-02-08 (×8): qty 0.5

## 2020-02-08 MED ORDER — PROMETHAZINE HCL 25 MG/ML IJ SOLN
12.5000 mg | Freq: Once | INTRAMUSCULAR | Status: AC
  Administered 2020-02-08: 12.5 mg via INTRAVENOUS
  Filled 2020-02-08: qty 1

## 2020-02-08 MED ORDER — MORPHINE SULFATE (PF) 4 MG/ML IV SOLN
4.0000 mg | Freq: Once | INTRAVENOUS | Status: AC
  Administered 2020-02-08: 4 mg via INTRAVENOUS
  Filled 2020-02-08: qty 1

## 2020-02-08 MED ORDER — PANTOPRAZOLE SODIUM 40 MG IV SOLR
40.0000 mg | Freq: Two times a day (BID) | INTRAVENOUS | Status: DC
  Administered 2020-02-08 – 2020-02-11 (×6): 40 mg via INTRAVENOUS
  Filled 2020-02-08 (×8): qty 40

## 2020-02-08 MED ORDER — LACTATED RINGERS IV BOLUS
1000.0000 mL | Freq: Once | INTRAVENOUS | Status: AC
  Administered 2020-02-08: 1000 mL via INTRAVENOUS

## 2020-02-08 MED ORDER — THIAMINE HCL 100 MG/ML IJ SOLN
500.0000 mg | Freq: Every day | INTRAVENOUS | Status: DC
  Administered 2020-02-08: 500 mg via INTRAVENOUS
  Filled 2020-02-08: qty 3
  Filled 2020-02-08: qty 5

## 2020-02-08 MED ORDER — LACTATED RINGERS IV SOLN: INTRAVENOUS | Status: DC

## 2020-02-08 MED ORDER — PROMETHAZINE HCL 25 MG/ML IJ SOLN
12.5000 mg | Freq: Four times a day (QID) | INTRAMUSCULAR | Status: DC
  Administered 2020-02-09 – 2020-02-11 (×10): 12.5 mg via INTRAVENOUS
  Filled 2020-02-08 (×11): qty 1

## 2020-02-08 MED ORDER — FOLIC ACID 5 MG/ML IJ SOLN
1.0000 mg | Freq: Every day | INTRAMUSCULAR | Status: DC
  Filled 2020-02-08: qty 0.2

## 2020-02-08 MED ORDER — ALUM & MAG HYDROXIDE-SIMETH 200-200-20 MG/5ML PO SUSP
30.0000 mL | Freq: Once | ORAL | Status: AC
  Administered 2020-02-08: 30 mL via ORAL
  Filled 2020-02-08: qty 30

## 2020-02-08 MED ORDER — ONDANSETRON HCL 4 MG/2ML IJ SOLN
4.0000 mg | Freq: Once | INTRAMUSCULAR | Status: AC
  Administered 2020-02-08: 4 mg via INTRAVENOUS
  Filled 2020-02-08: qty 2

## 2020-02-08 MED ORDER — DOXYLAMINE SUCCINATE (SLEEP) 25 MG PO TABS
12.5000 mg | ORAL_TABLET | Freq: Two times a day (BID) | ORAL | Status: DC
  Administered 2020-02-08 – 2020-02-11 (×5): 12.5 mg via ORAL
  Filled 2020-02-08 (×8): qty 1

## 2020-02-08 MED ORDER — DOCUSATE SODIUM 100 MG PO CAPS
100.0000 mg | ORAL_CAPSULE | Freq: Two times a day (BID) | ORAL | Status: DC
  Administered 2020-02-09 – 2020-02-11 (×6): 100 mg via ORAL
  Filled 2020-02-08 (×6): qty 1

## 2020-02-08 MED ORDER — THIAMINE HCL 100 MG/ML IJ SOLN
INTRAVENOUS | Status: DC
  Filled 2020-02-08 (×2): qty 1000

## 2020-02-08 MED ORDER — IOHEXOL 350 MG/ML SOLN
75.0000 mL | Freq: Once | INTRAVENOUS | Status: AC | PRN
  Administered 2020-02-08: 75 mL via INTRAVENOUS

## 2020-02-08 MED ORDER — LIDOCAINE VISCOUS HCL 2 % MT SOLN: 15.0000 mL | Freq: Once | OROMUCOSAL | Status: DC

## 2020-02-08 MED ORDER — SODIUM CHLORIDE 0.9 % IV SOLN
80.0000 mg | Freq: Once | INTRAVENOUS | Status: AC
  Administered 2020-02-08: 80 mg via INTRAVENOUS
  Filled 2020-02-08: qty 80

## 2020-02-08 MED ORDER — ONDANSETRON HCL 4 MG PO TABS
4.0000 mg | ORAL_TABLET | Freq: Three times a day (TID) | ORAL | Status: DC | PRN
  Administered 2020-02-09 (×2): 4 mg via ORAL
  Filled 2020-02-08 (×2): qty 1

## 2020-02-08 MED ORDER — SUCRALFATE 1 GM/10ML PO SUSP
1.0000 g | Freq: Three times a day (TID) | ORAL | Status: DC
  Administered 2020-02-08 – 2020-02-11 (×10): 1 g via ORAL
  Filled 2020-02-08 (×16): qty 10

## 2020-02-08 MED ORDER — ALUM & MAG HYDROXIDE-SIMETH 200-200-20 MG/5ML PO SUSP: 30.0000 mL | Freq: Once | ORAL | Status: DC

## 2020-02-08 MED ORDER — POTASSIUM CHLORIDE 10 MEQ/100ML IV SOLN
10.0000 meq | INTRAVENOUS | Status: AC
  Administered 2020-02-08 (×4): 10 meq via INTRAVENOUS
  Filled 2020-02-08 (×4): qty 100

## 2020-02-08 NOTE — ED Notes (Signed)
Pt c/o unrelieved with morphine, EDP aware.

## 2020-02-08 NOTE — ED Notes (Signed)
T&S, green, and purple top tubes sent to lab.

## 2020-02-08 NOTE — ED Notes (Signed)
EDP @ bedside 

## 2020-02-08 NOTE — ED Notes (Signed)
Consult at bedside.

## 2020-02-08 NOTE — Consult Note (Signed)
Wyline Mood , MD [address], [address], [place], [place], [address] [address], [address], [place], [place], [address] Phone: [phone]  Fax: [phone]  Consultation  Referring Provider:     Dr Erma Heritage Primary Care Physician:  [facility] Primary Gastroenterologist:  None          Reason for Consultation:     Hematemesis  Date of Admission:  02/08/2020 Date of Consultation:  02/08/2020         HPI:   Joanne Pacheco is a 19 y.o. female who is pregnant presents to the emergency room with abdominal pain and vomiting blood.  She [redacted] weeks pregnant.  Issues with nausea and vomiting throughout the pregnancy.   In the ER her hemoglobin is 12.5 g which is exactly the same as it was 10 days back and 4 weeks back.  Potassium low at 2.6 normal BUN and creatinine ratio.  Elevated AST and ALT at 111 and 156.  Total bilirubin 1.0..  Right upper quadrant ultrasound shows no acute findings.  Small stones and sludge seen in the gallbladder.   Nausea and vomiting has been ongoing throughout her pregnancy.  Initially when she started throwing up yesterday was cleared subsequently had some streaks of blood and subsequently after that had some clots of blood.  She says in total probably a quarter of a cup of coffee-ground material was thrown up over couple of episodes of vomiting.  At this point of time she has some burning sensation in the upper part of her chest.  She has not had a bowel movement for a few days.  Denies any NSAID use.   History reviewed. No pertinent past medical history.  History reviewed. No pertinent surgical history.  Prior to Admission medications   Medication Sig Start Date End Date Taking? Authorizing Provider  ondansetron (ZOFRAN) 4 MG tablet Take 1 tablet (4 mg total) by mouth daily as needed. 01/29/20 01/28/21 Yes Willy Eddy, MD  Prenatal Vit-Fe Fumarate-FA (PRENATAL MULTIVITAMIN) TABS tablet Take 1 tablet by mouth daily at 12 noon.   Yes  [provider]  Doxylamine-Pyridoxine (DICLEGIS) 10-10 MG TBEC Take two tablets at bedtime on day 1 and 2; if symptoms persist, take 1 tab in AM and 2 tabs at bedtime day 3, then 1 tab every 6 hr PRN Patient not taking: Reported on 02/08/2020 01/10/20   Shaune Pollack, MD  pantoprazole (PROTONIX) 40 MG tablet Take 1 tablet (40 mg total) by mouth daily for 7 days. 01/10/20 01/17/20  Shaune Pollack, MD  potassium chloride (KLOR-CON) 10 MEQ tablet Take 2 tablets (20 mEq total) by mouth daily for 5 days. 01/10/20 01/15/20  Shaune Pollack, MD    No family history on file.   Social History   Tobacco Use  . Smoking status: Never Smoker  . Smokeless tobacco: Never Used  Substance Use Topics  . Alcohol use: Never  . Drug use: Never    Allergies as of 02/08/2020  . (No Known Allergies)    Review of Systems:    All systems reviewed and negative except where noted in HPI.   Physical Exam:  Vital signs in last 24 hours: Temp:  [98.5 F (36.9 C)] 98.5 F (36.9 C) (03/25 0854) Pulse Rate:  [70-113] 78 (03/25 1400) Resp:  [17-47] 27 (03/25 1400) BP: (109-134)/(56-99) 119/99 (03/25 1400) SpO2:  [95 %-100 %] 98 % (03/25 1400) Weight:  [129.7 kg] 129.7 kg (03/25 0851)   General:   Pleasant,  cooperative in NAD Head:  Normocephalic and atraumatic. Eyes:   No icterus.   Conjunctiva pink. PERRLA. Ears:  Normal auditory acuity. Neck:  Supple; no masses or thyroidomegaly Lungs: Respirations even and unlabored. Lungs clear to auscultation bilaterally.   No wheezes, crackles, or rhonchi.  Heart:  Regular rate and rhythm;  Without murmur, clicks, rubs or gallops Abdomen:  Soft, nondistended, nontender. Normal bowel sounds. No appreciable masses or hepatomegaly.  No rebound or guarding.  Neurologic:  Alert and oriented x3;  grossly normal neurologically. Psych:  Alert and cooperative. Normal affect.  LAB RESULTS: Recent Labs    02/08/20 0855  WBC 10.5  HGB 12.5  HCT 35.6*  PLT 393    BMET Recent Labs    02/08/20 0855  NA 136  K 2.6*  CL 104  CO2 21*  GLUCOSE 97  BUN 8  CREATININE 0.40*  CALCIUM 9.4   LFT Recent Labs    02/08/20 0855  PROT 8.3*  ALBUMIN 3.9  AST 111*  ALT 156*  ALKPHOS 71  BILITOT 1.0   PT/INR Recent Labs    02/08/20 1114  LABPROT 13.1  INR 1.0    STUDIES: CT Angio Chest PE W and/or Wo Contrast  Result Date: 02/08/2020 CLINICAL DATA:  Tachypnea and short of breath following emesis. Cough. [redacted] weeks pregnant EXAM: CT ANGIOGRAPHY CHEST WITH CONTRAST TECHNIQUE: Multidetector CT imaging of the chest was performed using the standard protocol during bolus administration of intravenous contrast. Multiplanar CT image reconstructions and MIPs were obtained to evaluate the vascular anatomy. CONTRAST:  73mL OMNIPAQUE IOHEXOL 350 MG/ML SOLN COMPARISON:  Chest 02/08/2020 FINDINGS: Cardiovascular: Satisfactory opacification of the pulmonary arteries to the segmental level. No evidence of pulmonary embolism. Normal heart size. No pericardial effusion. Normal thoracic aorta. Heart size normal. Mediastinum/Nodes: Negative for mass or adenopathy. Lungs/Pleura: Lungs well aerated and clear. No infiltrate effusion or mass. Upper Abdomen: No acute abnormality. Musculoskeletal: Negative Review of the MIP images confirms the above findings. IMPRESSION: Negative CTA chest. Electronically Signed   By: Franchot Gallo M.D.   On: 02/08/2020 13:34   DG Chest Portable 1 View  Result Date: 02/08/2020 CLINICAL DATA:  Chest pain. Epigastric pain and hematemesis. Sixteen weeks pregnant. EXAM: PORTABLE CHEST 1 VIEW COMPARISON:  01/29/2020 FINDINGS: The cardiac silhouette is accentuated by hypoinflation. Lung volumes are low with bronchovascular crowding in the perihilar and basilar regions. No confluent airspace opacity, overt pulmonary edema, pleural effusion, pneumothorax is identified. No acute osseous abnormality is seen. IMPRESSION: Low lung volumes with bibasilar  atelectasis. Electronically Signed   By: Logan Bores M.D.   On: 02/08/2020 11:06   US Abdomen Limited RUQ  Result Date: 02/08/2020 CLINICAL DATA:  Right upper quadrant and epigastric pain. EXAM: ULTRASOUND ABDOMEN LIMITED RIGHT UPPER QUADRANT COMPARISON:  Right upper quadrant ultrasound, 01/29/2020. FINDINGS: Gallbladder: Echogenic material layers dependently in the gallbladder consistent with small stones and sludge, unchanged from the prior study. No wall thickening. No pericholecystic fluid. Common bile duct: Diameter: 3-4 mm. Liver: No focal lesion identified. Within normal limits in parenchymal echogenicity. Portal vein is patent on color Doppler imaging with normal direction of blood flow towards the liver. Other: None. IMPRESSION: 1. No acute findings. 2. Layering echogenic material in the gallbladder consistent with small stones and sludge. No acute cholecystitis. 3. No change from the recent prior right upper quadrant ultrasound. Electronically Signed   By: Lajean Manes M.D.   On: 02/08/2020 11:29      Impression / Plan:  Joanne Pacheco is a 19 y.o. y/o female who is [redacted] weeks pregnant presents to the emergency room with hematemesis and abdominal pain.  Hemoglobin is at baseline no elevation in BUN/creatinine ratio.  Is possible she has had a Mallory-Weiss tear.  Also noted to have elevated transaminases.  Plan 1.  IV hydration monitor CBC. 2.  Treat nausea and vomiting. 3.  Obtain acute hepatitis panel and will check for other viral hepatitis, follow-up Covid test. 4.  Check liver Doppler ultrasound to rule out portal vein thrombosis causing abnormal liver function tests 5.  Check urine for proteinuria, follow-up Tylenol levels, replace potassium and if persistently low may need to check magnesium levels. 6.  In terms of endoscopy decision to be made if the benefits of endoscopy and anesthesia outweigh the risks.  She is not too keen on an upper endoscopy at this moment.  Since her  hemoglobin has been stable at baseline, no elevation in BUN/creatinine ratio it is probably wiser and safer to wait and see how she does.  We can always proceed with endoscopy at any point of time if the clinical situation changes.  In the meanwhile I would suggest to continue treating her with a PPI 7.  She does have gallstones and has had recurrent vomiting.  May be worthwhile to consider evaluation by surgery to determine if her nausea and vomiting could be related to chronic cholecystitis/biliary colic.  If pain/nausea persists then may need to consider an MRCP to rule out stones in the common bile duct occasionally she might of passed the stone leading to elevation in transaminases as well.  Thank you for involving me in the care of this patient.      LOS: 0 days   Wyline Mood, MD  02/08/2020, 2:11 PM

## 2020-02-08 NOTE — H&P (Signed)
Joanne Pacheco is an 19 y.o. female.   Chief Complaint: Vomiting blood  HPI:  Patient presents to the ER today from work for vomiting of blood for two days. She reports that any time she eats or drinks water she vomits penny sized clots in acidic fluid. She has not vomitted since she game to the hospital this morning, she reports that this is likely because she has not had any food to eat or drink since her arrival. She reports that she has been nauseous since she became pregnant which has been about 4 months. She does not take any medication for the nausea. At home she can not keep down food or water. She vomits usually multiple times a day, generally 10 or more times a day. If she eats of drinks or smells something she will vomit.  She last ate yesterday. She likes soft foods. She reports eating fruit, chicken, and pretzels.  Additionally she reports that she has had dizziness. In mornings or when she drives she feels like her vision is blurred and she gets bad headaches. If she throws up a lot she will become dizzy.  She has been having substernal chest pain for over a week. It is painful for her to take deep breaths.     She has not had uterine cramping. About a month ago she had small amounts of blood when she wiped which she interpreted as implantation bleeding. She feels some small fetal movements.   GYN History Not using contraceptions when she got pregnant.  She has a history of irregular periods.  LMP 10/17/2019 Denies STD Denies fibrodis, polyps, ovarian cysts Denies history of endometriosis Denies  Any previous pregnancies.   OB History  G1P0 She has not had any obstetrical care thus far in her pregnancy. She reports that she would like to deliver here at [facility].  She has been taking a Prenatal vitamin   Nursing Staff Provider  Office Location  [facility] D[facility]    Language  English Anatomy US    Flu Vaccine   Genetic Screen  NIPS:   AFP:   First Screen:    TDaP vaccine    Hgb A1C  or  GTT Early : Third trimester :   Rhogam  Not needed   LAB RESULTS   Feeding Plan  Blood Type --/--/O POS (03/25 4656)   Contraception  Antibody NEG (03/25 0855)  Circumcision  Rubella    Pediatrician   RPR     Support Person  HBsAg     Prenatal Classes  HIV      Varicella   BTL Consent  GBS  (For PCN allergy, check sensitivities)        VBAC Consent  Pap   Under 21    Hgb Electro      CF      SMA           History reviewed. No pertinent past medical history.  History reviewed. No pertinent surgical history.  No family history on file. Social History:  reports that she has never smoked. She has never used smokeless tobacco. She reports that she does not drink alcohol or use drugs.  Allergies: No Known Allergies  (Not in a hospital admission)   Results for orders placed or performed during the hospital encounter of 02/08/20 (from the past 48 hour(s))  Comprehensive metabolic panel     Status: Abnormal   Collection Time: 02/08/20  8:55 AM  Result Value Ref Range   Sodium  136 135 - 145 mmol/L   Potassium 2.6 (LL) 3.5 - 5.1 mmol/L    Comment: CRITICAL RESULT CALLED TO, READ BACK BY AND VERIFIED WITH BILL SMITH@1013  ON 02/08/20 BY HKP    Chloride 104 98 - 111 mmol/L   CO2 21 (L) 22 - 32 mmol/L   Glucose, Bld 97 70 - 99 mg/dL    Comment: Glucose reference range applies only to samples taken after fasting for at least 8 hours.   BUN 8 6 - 20 mg/dL   Creatinine, Ser 0.40 (L) 0.44 - 1.00 mg/dL   Calcium 9.4 8.9 - 10.3 mg/dL   Total Protein 8.3 (H) 6.5 - 8.1 g/dL   Albumin 3.9 3.5 - 5.0 g/dL   AST 111 (H) 15 - 41 U/L   ALT 156 (H) 0 - 44 U/L   Alkaline Phosphatase 71 38 - 126 U/L   Total Bilirubin 1.0 0.3 - 1.2 mg/dL   GFR calc non Af Amer >60 >60 mL/min   GFR calc Af Amer >60 >60 mL/min   Anion gap 11 5 - 15    Comment: Performed at [facility], [address] [place], [place] [address]  CBC     Status: Abnormal   Collection Time: 02/08/20  8:55 AM   Result Value Ref Range   WBC 10.5 4.0 - 10.5 K/uL   RBC 4.18 3.87 - 5.11 MIL/uL   Hemoglobin 12.5 12.0 - 15.0 g/dL   HCT 35.6 (L) 36.0 - 46.0 %   MCV 85.2 80.0 - 100.0 fL   MCH 29.9 26.0 - 34.0 pg   MCHC 35.1 30.0 - 36.0 g/dL   RDW 13.2 11.5 - 15.5 %   Platelets 393 150 - 400 K/uL   nRBC 0.0 0.0 - 0.2 %    Comment: Performed at [facility], [address] [place], [place] [address]  Type and screen Crowder     Status: None   Collection Time: 02/08/20  8:55 AM  Result Value Ref Range   ABO/RH(D) O POS    Antibody Screen NEG    Sample Expiration      02/11/2020,2359 Performed at [facility], [address] [place], [place] [address]   Lipase, blood     Status: None   Collection Time: 02/08/20  8:55 AM  Result Value Ref Range   Lipase 40 11 - 51 U/L    Comment: Performed at [facility], [address] [place], [place] [address]  Brain natriuretic peptide     Status: None   Collection Time: 02/08/20  8:55 AM  Result Value Ref Range   B Natriuretic Peptide 39.0 0.0 - 100.0 pg/mL    Comment: Performed at [facility], [place],  [address]  Troponin I (High Sensitivity)     Status: None   Collection Time: 02/08/20  8:55 AM  Result Value Ref Range   Troponin I (High Sensitivity) 5 <18 ng/L    Comment: (NOTE) Elevated high sensitivity troponin I (hsTnI) values and significant  changes across serial measurements may suggest ACS but many other  chronic and acute conditions are known to elevate hsTnI results.  Refer to the "Links" section for chest pain algorithms and additional  guidance. Performed at [facility], [address], [place],  [address]   Magnesium     Status: None   Collection Time: 02/08/20 10:46 AM  Result Value Ref Range   Magnesium 2.2 1.7 -  2.4 mg/dL    Comment: Performed at [facility], [address] [place], [place] [address]   Acetaminophen level     Status: Abnormal   Collection Time: 02/08/20 10:46 AM  Result Value Ref Range   Acetaminophen (Tylenol), Serum <10 (L) 10 - 30 ug/mL    Comment: (NOTE) Therapeutic concentrations vary significantly. A range of 10-30 ug/mL  may be an effective concentration for many patients. However, some  are best treated at concentrations outside of this range. Acetaminophen concentrations >150 ug/mL at 4 hours after ingestion  and >50 ug/mL at 12 hours after ingestion are often associated with  toxic reactions. Performed at [facility], [address] [place], [place] [address]   Blood gas, arterial     Status: Abnormal   Collection Time: 02/08/20 11:13 AM  Result Value Ref Range   FIO2 0.21    pH, Arterial 7.47 (H) 7.350 - 7.450   pCO2 arterial 34 32.0 - 48.0 mmHg   pO2, Arterial 89 83.0 - 108.0 mmHg   Bicarbonate 24.7 20.0 - 28.0 mmol/L   Acid-Base Excess 1.5 0.0 - 2.0 mmol/L   O2 Saturation 97.4 %   Patient temperature 37.0    Collection site RIGHT RADIAL    Sample type ARTERIAL DRAW    Allens test (pass/fail) PASS PASS    Comment: Performed at [facility], [address] [place], [place] [address]  Protime-INR     Status: None   Collection Time: 02/08/20 11:14 AM  Result Value Ref Range   Prothrombin Time 13.1 11.4 - 15.2 seconds   INR 1.0 0.8 - 1.2    Comment: (NOTE) INR goal varies based on device and disease states. Performed at [facility], [address] [place], [place] [address]   Lactic acid, plasma     Status: None   Collection Time: 02/08/20 11:14 AM  Result Value Ref Range   Lactic Acid, Venous 1.4 0.5 - 1.9 mmol/L    Comment: Performed at [facility], [address] [place], [place] [address]  Respiratory Panel by RT PCR (Flu A&B, Covid) - Nasopharyngeal Swab     Status: None   Collection Time: 02/08/20 11:14 AM   Specimen: Nasopharyngeal Swab  Result Value Ref Range   SARS Coronavirus 2 by RT  PCR NEGATIVE NEGATIVE    Comment: (NOTE) SARS-CoV-2 target nucleic acids are NOT DETECTED. The SARS-CoV-2 RNA is generally detectable in upper respiratoy specimens during the acute phase of infection. The lowest concentration of SARS-CoV-2 viral copies this assay can detect is 131 copies/mL. A negative result does not preclude SARS-Cov-2 infection and should not be used as the sole basis for treatment or other patient management decisions. A negative result may occur with  improper specimen collection/handling, submission of specimen other than nasopharyngeal swab, presence of viral mutation(s) within the areas targeted by this assay, and inadequate number of viral copies (<131 copies/mL). A negative result must be combined with clinical observations, patient history, and epidemiological information. The expected result is Negative. Fact Sheet for Patients:  https://www.moore.com/https://www.fda.gov/media/142436/download Fact Sheet for Healthcare Providers:  https://www.young.biz/https://www.fda.gov/media/142435/download This test is not yet ap proved or cleared by the [place] FDA and  has been authorized for detection and/or diagnosis of SARS-CoV-2 by FDA under an Emergency Use Authorization (EUA). This EUA will remain  in effect (meaning this test can be used) for the duration of the COVID-19 declaration under Section 564(b)(1) of the Act, 21 U.S.C. section 360bbb-3(b)(1), unless the  authorization is terminated or revoked sooner.    Influenza A by PCR NEGATIVE NEGATIVE   Influenza B by PCR NEGATIVE NEGATIVE    Comment: (NOTE) The Xpert Xpress SARS-CoV-2/FLU/RSV assay is intended as an aid in  the diagnosis of influenza from Nasopharyngeal swab specimens and  should not be used as a sole basis for treatment. Nasal washings and  aspirates are unacceptable for Xpert Xpress SARS-CoV-2/FLU/RSV  testing. Fact Sheet for Patients: https://www.moore.com/ Fact Sheet for Healthcare  Providers: https://www.young.biz/ This test is not yet approved or cleared by the [place] FDA and  has been authorized for detection and/or diagnosis of SARS-CoV-2 by  FDA under an Emergency Use Authorization (EUA). This EUA will remain  in effect (meaning this test can be used) for the duration of the  Covid-19 declaration under Section 564(b)(1) of the Act, 21  U.S.C. section 360bbb-3(b)(1), unless the authorization is  terminated or revoked. Performed at [facility], [address] [place], [place] [address]    CT Angio Chest PE W and/or Wo Contrast  Result Date: 02/08/2020 CLINICAL DATA:  Tachypnea and short of breath following emesis. Cough. [redacted] weeks pregnant EXAM: CT ANGIOGRAPHY CHEST WITH CONTRAST TECHNIQUE: Multidetector CT imaging of the chest was performed using the standard protocol during bolus administration of intravenous contrast. Multiplanar CT image reconstructions and MIPs were obtained to evaluate the vascular anatomy. CONTRAST:  53mL OMNIPAQUE IOHEXOL 350 MG/ML SOLN COMPARISON:  Chest 02/08/2020 FINDINGS: Cardiovascular: Satisfactory opacification of the pulmonary arteries to the segmental level. No evidence of pulmonary embolism. Normal heart size. No pericardial effusion. Normal thoracic aorta. Heart size normal. Mediastinum/Nodes: Negative for mass or adenopathy. Lungs/Pleura: Lungs well aerated and clear. No infiltrate effusion or mass. Upper Abdomen: No acute abnormality. Musculoskeletal: Negative Review of the MIP images confirms the above findings. IMPRESSION: Negative CTA chest. Electronically Signed   By: Marlan Palau M.D.   On: 02/08/2020 13:34   DG Chest Portable 1 View  Result Date: 02/08/2020 CLINICAL DATA:  Chest pain. Epigastric pain and hematemesis. Sixteen weeks pregnant. EXAM: PORTABLE CHEST 1 VIEW COMPARISON:  01/29/2020 FINDINGS: The cardiac silhouette is accentuated by hypoinflation. Lung volumes are low with  bronchovascular crowding in the perihilar and basilar regions. No confluent airspace opacity, overt pulmonary edema, pleural effusion, pneumothorax is identified. No acute osseous abnormality is seen. IMPRESSION: Low lung volumes with bibasilar atelectasis. Electronically Signed   By: Sebastian Ache M.D.   On: 02/08/2020 11:06   US Abdomen Limited RUQ  Result Date: 02/08/2020 CLINICAL DATA:  Right upper quadrant and epigastric pain. EXAM: ULTRASOUND ABDOMEN LIMITED RIGHT UPPER QUADRANT COMPARISON:  Right upper quadrant ultrasound, 01/29/2020. FINDINGS: Gallbladder: Echogenic material layers dependently in the gallbladder consistent with small stones and sludge, unchanged from the prior study. No wall thickening. No pericholecystic fluid. Common bile duct: Diameter: 3-4 mm. Liver: No focal lesion identified. Within normal limits in parenchymal echogenicity. Portal vein is patent on color Doppler imaging with normal direction of blood flow towards the liver. Other: None. IMPRESSION: 1. No acute findings. 2. Layering echogenic material in the gallbladder consistent with small stones and sludge. No acute cholecystitis. 3. No change from the recent prior right upper quadrant ultrasound. Electronically Signed   By: Amie Portland M.D.   On: 02/08/2020 11:29    Review of Systems  Constitutional: Negative for chills and fever.  HENT: Negative for congestion, hearing loss and sinus pain.   Respiratory: Negative for cough, shortness of breath and wheezing.   Cardiovascular: Negative  for chest pain, palpitations and leg swelling.  Gastrointestinal: Negative for abdominal pain, constipation, diarrhea, nausea and vomiting.  Genitourinary: Negative for dysuria, flank pain, frequency, hematuria and urgency.  Musculoskeletal: Negative for back pain.  Skin: Negative for rash.  Neurological: Negative for dizziness and headaches.  Psychiatric/Behavioral: Negative for suicidal ideas. The patient is not nervous/anxious.      Blood pressure 121/87, pulse (!) 113, temperature 98.5 F (36.9 C), resp. rate 18, height 5\' 6"  (1.676 m), weight 129.7 kg, SpO2 97 %. Physical Exam  Nursing note and vitals reviewed. Constitutional: She is oriented to person, place, and time. She appears well-developed and well-nourished.  HENT:  Head: Normocephalic and atraumatic.  Cardiovascular: Regular rhythm and S1 normal.  Respiratory: Effort normal and breath sounds normal. Tachypnea noted.  GI: Soft. Bowel sounds are normal.  Musculoskeletal:        General: Normal range of motion.  Neurological: She is alert and oriented to person, place, and time.  Skin: Skin is warm and dry.  Psychiatric: She has a normal mood and affect. Her behavior is normal. Judgment and thought content normal.  FHR: 160 bpm.   Assessment/Plan 19 yo with hyperemesis 1. Hyperemesis with excessive vomiting which has lead to esophageal irritation and bleeding. Patient has been evaluated by GI who does not feel that presentation is consistent with 12 tear or active bleeding from a gastric ulcer. Recommendation is to continue Protonix.  Carafate suspension with meals and at bedtime.  Will restart B6 and Unisom twice a day. Phenergan IV twice a day. IV fluid hydration with Banana Bag (thiamine, potassium, folic acid, and multivitamin)  NPO- advance diet slowly.  2. Hypokalemia- replacement with PO and IV potassium. Will rechek electrolytes in the AM 3. Tachypnea has been unexplained. Consider medicine consult if no improvement overnight. Chest x-ray showed low ling volumes and atelectasis.  4. Limited prenatal care. Will obtain prenatal labs in the morning. Will need dating and anatomy Gaspar Skeeters which can be completed as an outpatient.  5. Transaminitis- Hepatitis panel pending. Liver Doppler US normal without evidence of thrombosis. Small gallstones and sludge noted. No acute cholecystitis.  GI recommendations to consider surgery consultation and MRCP  if persistent pain and nausea.   [place], MD 02/08/2020, 1:59 PM

## 2020-02-08 NOTE — ED Triage Notes (Signed)
Pt states she is [redacted] weeks pregnant and having epigastric pain with bloody emesis today, states she vomited blood clots while at work today and they told her to come here pt is in NAD at present.

## 2020-02-08 NOTE — ED Provider Notes (Signed)
[facility] Emergency Department Provider Note  ____________________________________________   First MD Initiated Contact with Patient 02/08/20 [phone]     (approximate)  I have reviewed the triage vital signs and the nursing notes.   HISTORY  Chief Complaint Abdominal Pain and Hematemesis    HPI Joanne Pacheco is a 19 y.o. female here with abdominal pain, vomiting blood.  The patient is currently [redacted] weeks pregnant.  She has had issues with recurrent nausea and vomiting throughout her pregnancy.  She states that over the last 2 days, she has begun vomiting initially dark black/red blood clots, but is now having increasingly severe epigastric pain.  The pain is burning, aching, radiates towards her back.  She states she was at work today and began vomiting large amounts of clots and was subsequently told to come to the ED.  She feels mildly lightheaded and short of breath.  No known fevers.  No history of ulcers.  This is her first pregnancy.  She is not on blood thinners pain is worse with movement and palpation.  No alleviating factors..        History reviewed. No pertinent past medical history.  Patient Active Problem List   Diagnosis Date Noted   Hyperemesis 02/08/2020    History reviewed. No pertinent surgical history.  Prior to Admission medications   Medication Sig Start Date End Date Taking? Authorizing Provider  ondansetron (ZOFRAN) 4 MG tablet Take 1 tablet (4 mg total) by mouth daily as needed. 01/29/20 01/28/21 Yes Willy Eddy, MD  Doxylamine-Pyridoxine (DICLEGIS) 10-10 MG TBEC Take two tablets at bedtime on day 1 and 2; if symptoms persist, take 1 tab in AM and 2 tabs at bedtime day 3, then 1 tab every 6 hr PRN Patient not taking: Reported on 02/08/2020 01/10/20   Shaune Pollack, MD  pantoprazole (PROTONIX) 40 MG tablet Take 1 tablet (40 mg total) by mouth daily for 7 days. 01/10/20 01/17/20  Shaune Pollack, MD  potassium chloride  (KLOR-CON) 10 MEQ tablet Take 2 tablets (20 mEq total) by mouth daily for 5 days. 01/10/20 01/15/20  Shaune Pollack, MD  Prenatal Vit-Fe Fumarate-FA (PRENATAL MULTIVITAMIN) TABS tablet Take 1 tablet by mouth daily at 12 noon.    [provider]    Allergies Patient has no known allergies.  No family history on file.  Social History Social History   Tobacco Use   Smoking status: Never Smoker   Smokeless tobacco: Never Used  Substance Use Topics   Alcohol use: Never   Drug use: Never    Review of Systems  Review of Systems  Constitutional: Positive for fatigue. Negative for fever.  HENT: Negative for congestion and sore throat.   Eyes: Negative for visual disturbance.  Respiratory: Negative for cough and shortness of breath.   Cardiovascular: Negative for chest pain.  Gastrointestinal: Positive for abdominal pain, nausea and vomiting. Negative for diarrhea.  Genitourinary: Negative for flank pain.  Musculoskeletal: Negative for back pain and neck pain.  Skin: Negative for rash and wound.  Neurological: Positive for weakness.     ____________________________________________  PHYSICAL EXAM:      VITAL SIGNS: ED Triage Vitals  Enc Vitals Group     BP 02/08/20 0851 121/87     Pulse Rate 02/08/20 0851 (!) 113     Resp 02/08/20 0851 18     Temp 02/08/20 0854 98.5 F (36.9 C)     Temp Source 02/08/20 0851 Oral     SpO2 02/08/20 0851  97 %     Weight 02/08/20 0851 286 lb (129.7 kg)     Height 02/08/20 0851 5\' 6"  (1.676 m)     Head Circumference --      Peak Flow --      Pain Score 02/08/20 0851 10     Pain Loc --      Pain Edu? --      Excl. in [place]? --      Physical Exam Vitals and nursing note reviewed.  Constitutional:      General: She is not in acute distress.    Appearance: She is well-developed.  HENT:     Head: Normocephalic and atraumatic.  Eyes:     Conjunctiva/sclera: Conjunctivae normal.  Cardiovascular:     Rate and Rhythm: Normal rate  and regular rhythm.     Heart sounds: Normal heart sounds. No murmur. No friction rub.  Pulmonary:     Effort: No respiratory distress.     Breath sounds: Normal breath sounds. No wheezing or rales.     Comments: Mild tachypnea noted Abdominal:     General: There is no distension.     Palpations: Abdomen is soft.     Tenderness: There is abdominal tenderness in the epigastric area. There is guarding. There is no rebound.  Musculoskeletal:     Cervical back: Neck supple.  Skin:    General: Skin is warm.     Capillary Refill: Capillary refill takes less than 2 seconds.  Neurological:     Mental Status: She is alert and oriented to person, place, and time.     Motor: No abnormal muscle tone.       ____________________________________________   LABS (all labs ordered are listed, but only abnormal results are displayed)  Labs Reviewed  COMPREHENSIVE METABOLIC PANEL - Abnormal; Notable for the following components:      Result Value   Potassium 2.6 (*)    CO2 21 (*)    Creatinine, Ser 0.40 (*)    Total Protein 8.3 (*)    AST 111 (*)    ALT 156 (*)    All other components within normal limits  CBC - Abnormal; Notable for the following components:   HCT 35.6 (*)    All other components within normal limits  ACETAMINOPHEN LEVEL - Abnormal; Notable for the following components:   Acetaminophen (Tylenol), Serum <10 (*)    All other components within normal limits  BLOOD GAS, ARTERIAL - Abnormal; Notable for the following components:   pH, Arterial 7.47 (*)    All other components within normal limits  CBC - Abnormal; Notable for the following components:   RBC 3.72 (*)    Hemoglobin 11.2 (*)    HCT 32.4 (*)    All other components within normal limits  URINALYSIS, ROUTINE W REFLEX MICROSCOPIC - Abnormal; Notable for the following components:   Color, Urine AMBER (*)    APPearance HAZY (*)    Specific Gravity, Urine >1.046 (*)    Ketones, ur 80 (*)    Protein, ur 30 (*)      Leukocytes,Ua SMALL (*)    All other components within normal limits  COMPREHENSIVE METABOLIC PANEL - Abnormal; Notable for the following components:   Potassium 2.9 (*)    AST 104 (*)    ALT 145 (*)    All other components within normal limits  SALICYLATE LEVEL - Abnormal; Notable for the following components:   Salicylate Lvl <8.3 (*)  All other components within normal limits  RESPIRATORY PANEL BY RT PCR (FLU A&B, COVID)  LIPASE, BLOOD  MAGNESIUM  HEPATITIS PANEL, ACUTE  BRAIN NATRIURETIC PEPTIDE  PROTIME-INR  LACTIC ACID, PLASMA  LACTIC ACID, PLASMA  HEPATITIS B CORE ANTIBODY, IGM  EBV AB TO VIRAL CAPSID AG PNL, IGG+IGM  HERPES SIMPLEX VIRUS(HSV) DNA BY PCR  CMV DNA, QUANTITATIVE, PCR  VARICELLA-ZOSTER BY PCR  HEPATITIS B DNA, ULTRAQUANTITATIVE, PCR  HEPATITIS B E ANTIGEN  HEPATITIS B E ANTIBODY  HEPATITIS B SURFACE ANTIBODY, QUANTITATIVE  HEPATITIS C VRS RNA DETECT BY PCR-QUAL  HIV ANTIBODY (ROUTINE TESTING W REFLEX)  CBC  ELECTROLYTE PANEL  TYPE AND SCREEN  TROPONIN I (HIGH SENSITIVITY)    ____________________________________________  EKG: Normal sinus rhythm, VR 82. QRS 108, QTc 437. Non-specific TWI noted in lateral leads.    ________________________________________  RADIOLOGY All imaging, including plain films, CT scans, and ultrasounds, independently reviewed by me, and interpretations confirmed via formal radiology reads.  ED MD interpretation:   CT Angio: Negative for PE, esophageal perforation, or other complication CXR: Clear [place] RUQ: Negative  Official radiology report(s): CT Angio Chest PE W and/or Wo Contrast  Result Date: 02/08/2020 CLINICAL DATA:  Tachypnea and short of breath following emesis. Cough. [redacted] weeks pregnant EXAM: CT ANGIOGRAPHY CHEST WITH CONTRAST TECHNIQUE: Multidetector CT imaging of the chest was performed using the standard protocol during bolus administration of intravenous contrast. Multiplanar CT image reconstructions and  MIPs were obtained to evaluate the vascular anatomy. CONTRAST:  27mL OMNIPAQUE IOHEXOL 350 MG/ML SOLN COMPARISON:  Chest 02/08/2020 FINDINGS: Cardiovascular: Satisfactory opacification of the pulmonary arteries to the segmental level. No evidence of pulmonary embolism. Normal heart size. No pericardial effusion. Normal thoracic aorta. Heart size normal. Mediastinum/Nodes: Negative for mass or adenopathy. Lungs/Pleura: Lungs well aerated and clear. No infiltrate effusion or mass. Upper Abdomen: No acute abnormality. Musculoskeletal: Negative Review of the MIP images confirms the above findings. IMPRESSION: Negative CTA chest. Electronically Signed   By: Marlan Palau M.D.   On: 02/08/2020 13:34   DG Chest Portable 1 View  Result Date: 02/08/2020 CLINICAL DATA:  Chest pain. Epigastric pain and hematemesis. Sixteen weeks pregnant. EXAM: PORTABLE CHEST 1 VIEW COMPARISON:  01/29/2020 FINDINGS: The cardiac silhouette is accentuated by hypoinflation. Lung volumes are low with bronchovascular crowding in the perihilar and basilar regions. No confluent airspace opacity, overt pulmonary edema, pleural effusion, pneumothorax is identified. No acute osseous abnormality is seen. IMPRESSION: Low lung volumes with bibasilar atelectasis. Electronically Signed   By: Sebastian Ache M.D.   On: 02/08/2020 11:06   US LIVER DOPPLER  Result Date: 02/08/2020 CLINICAL DATA:  Abnormal liver function tests. EXAM: DUPLEX ULTRASOUND OF LIVER TECHNIQUE: Color and duplex Doppler ultrasound was performed to evaluate the hepatic in-flow and out-flow vessels. COMPARISON:  None. FINDINGS: Liver: Normal parenchymal echogenicity. Normal hepatic contour without nodularity. No focal lesion, mass or intrahepatic biliary ductal dilatation. Main Portal Vein size: 3 cm Portal Vein Velocities Main Prox:  43 cm/sec Main Mid: 33 cm/sec Main Dist:  30 cm/sec Right: 18 cm/sec Left: 20 cm/sec Normal hepatopetal flow is noted in the portal veins. Hepatic  Vein Velocities Right:  18 cm/sec Middle:  23 cm/sec Left:  14 cm/sec Normal hepatofugal flow is noted in the hepatic veins. IVC: Present and patent with normal respiratory phasicity. Hepatic Artery Velocity:  53 cm/sec Splenic Vein Velocity:  21 cm/sec Spleen: 8.3 cm x 8.5 cm x 4.9 cm with a total volume of 182 cm^3 (411 cm^3  is upper limit normal) Portal Vein Occlusion/Thrombus: No Splenic Vein Occlusion/Thrombus: No Ascites: None Varices: None IMPRESSION: No evidence of hepatic, portal or splenic venous thrombosis or occlusion. Electronically Signed   By: Lupita Raider M.D.   On: 02/08/2020 15:23   US Abdomen Limited RUQ  Result Date: 02/08/2020 CLINICAL DATA:  Right upper quadrant and epigastric pain. EXAM: ULTRASOUND ABDOMEN LIMITED RIGHT UPPER QUADRANT COMPARISON:  Right upper quadrant ultrasound, 01/29/2020. FINDINGS: Gallbladder: Echogenic material layers dependently in the gallbladder consistent with small stones and sludge, unchanged from the prior study. No wall thickening. No pericholecystic fluid. Common bile duct: Diameter: 3-4 mm. Liver: No focal lesion identified. Within normal limits in parenchymal echogenicity. Portal vein is patent on color Doppler imaging with normal direction of blood flow towards the liver. Other: None. IMPRESSION: 1. No acute findings. 2. Layering echogenic material in the gallbladder consistent with small stones and sludge. No acute cholecystitis. 3. No change from the recent prior right upper quadrant ultrasound. Electronically Signed   By: Amie Portland M.D.   On: 02/08/2020 11:29    ____________________________________________  PROCEDURES   Procedure(s) performed (including Critical Care):  Procedures  ____________________________________________  INITIAL IMPRESSION / MDM / ASSESSMENT AND PLAN / ED COURSE  As part of my medical decision making, I reviewed the following data within the electronic MEDICAL RECORD NUMBER Nursing notes reviewed and  incorporated, Old chart reviewed, Notes from prior ED visits, and [place] Controlled Substance Database       *Maryum Batterson was evaluated in Emergency Department on 02/08/2020 for the symptoms described in the history of present illness. She was evaluated in the context of the global COVID-19 pandemic, which necessitated consideration that the patient might be at risk for infection with the SARS-CoV-2 virus that causes COVID-19. Institutional protocols and algorithms that pertain to the evaluation of patients at risk for COVID-19 are in a state of rapid change based on information released by regulatory bodies including the CDC and federal and state organizations. These policies and algorithms were followed during the patient's care in the ED.  Some ED evaluations and interventions may be delayed as a result of limited staffing during the pandemic.*     Medical Decision Making: 19 year-old female, G1, P0 at estimated [redacted] weeks gestational age, here with nausea, vomiting, abdominal pain, and reported hematemesis.  Patient is tachypneic, unclear whether this is related to pain.  Sats are normal.  She has diffuse epigastric tenderness on exam.  Lab work shows likely significant dehydration with potassium of 2.6.  She also has persistently elevating transaminitis concerning for ongoing dehydration and hyperemesis.  I have added on Tylenol level and hepatitis panel.  No known history of underlying liver disease.  Otherwise, hemoglobin is stable.  Given her marked tachypnea, work-up broadened to include blood gas, lactic acid, BNP and troponin which were all unremarkable. Given her persistent tachypnea and reported hematemesis vs hemoptysis, I had a long discussion with her as well as Dr. Bjorn Pippin re: possibility of esophageal perforation/Boorehave's, perforated ulcer/free air, also less likely PE or cardiomyopathy/CHF. Given the potential risks of these versus the low risk of radiation exposure from a single  shielded CT, decision made to pursue CT Angio of the chest, which based on discussion w/ radiology should show both PE, CM, as well as esophageal issues or intra-abd free air from stomach ulcers.   Fortunately, CT scan is negative. Pt improving with fluids, analgesia but she does remain tachypneic. I've repeated CBC, CMP which shows  mild decrease in Hgb as expected. She does not appear to have a worsening acidemia or anion gap acidosis. She is satting well on RA. I've added on a salicylate given her psychiatric history though I suspect this is unlikely in setting of normal CO2 and gap.   Will plan to admit to OBGYN for close monitoring and trending of labs, as well as tx of possible MWT or gastritis. Discussed with Dr. Tobi BastosAnna of GI as well who will consult and see pt. Will continue IV PPI BID and supportive care. Abdomen remains soft and pt remains otherwise stable with exception of tachypnea. Again, her EKG is nonischemic, trop neg, BNP normal and I do not see signs of pregnancy-related or other cardiomyopathy. If tachypnea persists, could consider echocardiogram and cardiology or pulmonology referral. Absence of any increasing WBC, abdominal TTP, and normal CT makes perforated ulcer unlikely at this time.  ____________________________________________  FINAL CLINICAL IMPRESSION(S) / ED DIAGNOSES  Final diagnoses:  Epigastric pain  Hematemesis with nausea  RUQ pain  Tachypnea     MEDICATIONS GIVEN DURING THIS VISIT:  Medications  ondansetron (ZOFRAN) tablet 4 mg (has no administration in time range)  prenatal multivitamin tablet 1 tablet (has no administration in time range)  lactated ringers infusion ( Intravenous New Bag/Given 02/08/20 1719)  docusate sodium (COLACE) capsule 100 mg (has no administration in time range)  dextrose 5 % and 0.45% NaCl 1,000 mL with thiamine 100 mg, folic acid 1 mg, multivitamins adult 10 mL, potassium chloride 40 mEq infusion ( Intravenous New Bag/Given 02/08/20  2036)  pantoprazole (PROTONIX) injection 40 mg (40 mg Intravenous Given 02/08/20 2028)  sucralfate (CARAFATE) 1 GM/10ML suspension 1 g (1 g Oral Given 02/08/20 2016)  promethazine (PHENERGAN) injection 12.5 mg (has no administration in time range)  doxylamine (Sleep) (UNISOM) tablet 12.5 mg (12.5 mg Oral Given 02/08/20 2009)    And  pyridOXINE (VITAMIN B-6) tablet 25 mg (25 mg Oral Given 02/08/20 2009)  pantoprazole (PROTONIX) 80 mg in sodium chloride 0.9 % 100 mL IVPB (0 mg Intravenous Stopped 02/08/20 1033)  lactated ringers bolus 1,000 mL (0 mLs Intravenous Stopped 02/08/20 1100)  morphine 4 MG/ML injection 4 mg (4 mg Intravenous Given 02/08/20 0953)  ondansetron (ZOFRAN) injection 4 mg (4 mg Intravenous Given 02/08/20 0952)  potassium chloride 10 mEq in 100 mL IVPB (10 mEq Intravenous New Bag/Given 02/08/20 1722)  morphine 4 MG/ML injection 4 mg (4 mg Intravenous Given 02/08/20 1045)  alum & mag hydroxide-simeth (MAALOX/MYLANTA) 200-200-20 MG/5ML suspension 30 mL (30 mLs Oral Given 02/08/20 1259)  promethazine (PHENERGAN) injection 12.5 mg (12.5 mg Intravenous Given 02/08/20 1254)  iohexol (OMNIPAQUE) 350 MG/ML injection 75 mL (75 mLs Intravenous Contrast Given 02/08/20 1319)     ED Discharge Orders    None       Note:  This document was prepared using Dragon voice recognition software and may include unintentional dictation errors.   Shaune PollackIsaacs, Robyn Nohr, MD 02/08/20 2046

## 2020-02-08 NOTE — ED Notes (Signed)
Pt c/o nausea, worsening with postural changes, and pain in her abdominal area. Pt stated difficulty taking a deep breath.

## 2020-02-09 ENCOUNTER — Encounter: Payer: Self-pay | Admitting: Anesthesiology

## 2020-02-09 ENCOUNTER — Inpatient Hospital Stay: Payer: Medicaid Other

## 2020-02-09 ENCOUNTER — Encounter: Payer: Self-pay | Admitting: Obstetrics and Gynecology

## 2020-02-09 ENCOUNTER — Other Ambulatory Visit: Payer: Self-pay | Admitting: Surgery

## 2020-02-09 DIAGNOSIS — R0682 Tachypnea, not elsewhere classified: Secondary | ICD-10-CM | POA: Diagnosis present

## 2020-02-09 DIAGNOSIS — R1110 Vomiting, unspecified: Secondary | ICD-10-CM

## 2020-02-09 DIAGNOSIS — K819 Cholecystitis, unspecified: Secondary | ICD-10-CM

## 2020-02-09 DIAGNOSIS — M940 Chondrocostal junction syndrome [Tietze]: Secondary | ICD-10-CM

## 2020-02-09 DIAGNOSIS — Z3A16 16 weeks gestation of pregnancy: Secondary | ICD-10-CM

## 2020-02-09 DIAGNOSIS — R1013 Epigastric pain: Secondary | ICD-10-CM

## 2020-02-09 DIAGNOSIS — E876 Hypokalemia: Secondary | ICD-10-CM | POA: Diagnosis present

## 2020-02-09 DIAGNOSIS — R1011 Right upper quadrant pain: Secondary | ICD-10-CM

## 2020-02-09 DIAGNOSIS — R0781 Pleurodynia: Secondary | ICD-10-CM | POA: Diagnosis present

## 2020-02-09 DIAGNOSIS — E669 Obesity, unspecified: Secondary | ICD-10-CM | POA: Diagnosis present

## 2020-02-09 DIAGNOSIS — Z3490 Encounter for supervision of normal pregnancy, unspecified, unspecified trimester: Secondary | ICD-10-CM

## 2020-02-09 DIAGNOSIS — R7989 Other specified abnormal findings of blood chemistry: Secondary | ICD-10-CM | POA: Diagnosis present

## 2020-02-09 DIAGNOSIS — K920 Hematemesis: Secondary | ICD-10-CM | POA: Diagnosis present

## 2020-02-09 DIAGNOSIS — R945 Abnormal results of liver function studies: Secondary | ICD-10-CM | POA: Diagnosis present

## 2020-02-09 LAB — CBC
HCT: 30.3 % — ABNORMAL LOW (ref 36.0–46.0)
Hemoglobin: 10.5 g/dL — ABNORMAL LOW (ref 12.0–15.0)
MCH: 30.3 pg (ref 26.0–34.0)
MCHC: 34.7 g/dL (ref 30.0–36.0)
MCV: 87.3 fL (ref 80.0–100.0)
Platelets: 336 10*3/uL (ref 150–400)
RBC: 3.47 MIL/uL — ABNORMAL LOW (ref 3.87–5.11)
RDW: 13.3 % (ref 11.5–15.5)
WBC: 8.4 10*3/uL (ref 4.0–10.5)
nRBC: 0 % (ref 0.0–0.2)

## 2020-02-09 LAB — URINE DRUG SCREEN, QUALITATIVE (ARMC ONLY)
Amphetamines, Ur Screen: NOT DETECTED
Barbiturates, Ur Screen: NOT DETECTED
Benzodiazepine, Ur Scrn: NOT DETECTED
Cannabinoid 50 Ng, Ur ~~LOC~~: NOT DETECTED
Cocaine Metabolite,Ur ~~LOC~~: NOT DETECTED
MDMA (Ecstasy)Ur Screen: NOT DETECTED
Methadone Scn, Ur: NOT DETECTED
Opiate, Ur Screen: NOT DETECTED
Phencyclidine (PCP) Ur S: NOT DETECTED
Tricyclic, Ur Screen: NOT DETECTED

## 2020-02-09 LAB — COMPREHENSIVE METABOLIC PANEL
ALT: 164 U/L — ABNORMAL HIGH (ref 0–44)
AST: 125 U/L — ABNORMAL HIGH (ref 15–41)
Albumin: 3.4 g/dL — ABNORMAL LOW (ref 3.5–5.0)
Alkaline Phosphatase: 66 U/L (ref 38–126)
Anion gap: 8 (ref 5–15)
BUN: <5 mg/dL — ABNORMAL LOW (ref 6–20)
CO2: 23 mmol/L (ref 22–32)
Calcium: 9.2 mg/dL (ref 8.9–10.3)
Chloride: 106 mmol/L (ref 98–111)
Creatinine, Ser: 0.47 mg/dL (ref 0.44–1.00)
GFR calc Af Amer: >60 mL/min (ref >60)
GFR calc non Af Amer: >60 mL/min (ref >60)
Glucose, Bld: 89 mg/dL (ref 70–99)
Potassium: 3.4 mmol/L — ABNORMAL LOW (ref 3.5–5.1)
Sodium: 137 mmol/L (ref 135–145)
Total Bilirubin: 0.8 mg/dL (ref 0.3–1.2)
Total Protein: 7.3 g/dL (ref 6.5–8.1)

## 2020-02-09 LAB — TROPONIN I (HIGH SENSITIVITY)
Troponin I (High Sensitivity): 20 ng/L — ABNORMAL HIGH (ref <18)
Troponin I (High Sensitivity): 20 ng/L — ABNORMAL HIGH (ref <18)
Troponin I (High Sensitivity): 20 ng/L — ABNORMAL HIGH (ref <18)

## 2020-02-09 LAB — ELECTROLYTE PANEL
Anion gap: 9 (ref 5–15)
CO2: 23 mmol/L (ref 22–32)
Chloride: 106 mmol/L (ref 98–111)
Potassium: 2.9 mmol/L — ABNORMAL LOW (ref 3.5–5.1)
Sodium: 138 mmol/L (ref 135–145)

## 2020-02-09 LAB — HEPATITIS B DNA, ULTRAQUANTITATIVE, PCR
HBV DNA SERPL PCR-ACNC: NOT DETECTED IU/mL
HBV DNA SERPL PCR-LOG IU: UNDETERMINED log10 IU/mL

## 2020-02-09 LAB — HEPATITIS B E ANTIBODY: Hep B E Ab: NEGATIVE

## 2020-02-09 LAB — BRAIN NATRIURETIC PEPTIDE: B Natriuretic Peptide: 27 pg/mL (ref 0.0–100.0)

## 2020-02-09 LAB — HEPATITIS B E ANTIGEN: Hep B E Ag: NEGATIVE

## 2020-02-09 LAB — ALT: ALT: 147 U/L — ABNORMAL HIGH (ref 0–44)

## 2020-02-09 LAB — EBV AB TO VIRAL CAPSID AG PNL, IGG+IGM
EBV VCA IgG: 169 U/mL — ABNORMAL HIGH (ref 0.0–17.9)
EBV VCA IgM: <36 U/mL (ref 0.0–35.9)

## 2020-02-09 LAB — HIV ANTIBODY (ROUTINE TESTING W REFLEX): HIV Screen 4th Generation wRfx: NONREACTIVE

## 2020-02-09 LAB — AST: AST: 115 U/L — ABNORMAL HIGH (ref 15–41)

## 2020-02-09 LAB — HEPATITIS B SURFACE ANTIBODY, QUANTITATIVE: Hep B S AB Quant (Post): 6.9 m[IU]/mL — ABNORMAL LOW (ref >9.9)

## 2020-02-09 LAB — HEPATITIS C VRS RNA DETECT BY PCR-QUAL: Hepatitis C Vrs RNA by PCR-Qual: NEGATIVE

## 2020-02-09 MED ORDER — SODIUM CHLORIDE 0.9 % IV SOLN: INTRAVENOUS | Status: DC

## 2020-02-09 MED ORDER — INDOCYANINE GREEN 25 MG IV SOLR
1.2500 mg | Freq: Once | INTRAVENOUS | Status: DC
  Filled 2020-02-09: qty 10

## 2020-02-09 MED ORDER — OXYCODONE-ACETAMINOPHEN 5-325 MG PO TABS
1.0000 | ORAL_TABLET | Freq: Four times a day (QID) | ORAL | Status: DC | PRN
  Administered 2020-02-09: 1 via ORAL
  Filled 2020-02-09: qty 1

## 2020-02-09 MED ORDER — OXYCODONE HCL 5 MG PO TABS
5.0000 mg | ORAL_TABLET | Freq: Four times a day (QID) | ORAL | Status: DC | PRN
  Filled 2020-02-09: qty 1

## 2020-02-09 MED ORDER — PREDNISONE 10 MG PO TABS
10.0000 mg | ORAL_TABLET | Freq: Every day | ORAL | Status: DC
  Administered 2020-02-10 – 2020-02-11 (×2): 10 mg via ORAL
  Filled 2020-02-09 (×3): qty 1

## 2020-02-09 MED ORDER — POTASSIUM CHLORIDE CRYS ER 20 MEQ PO TBCR: 40.0000 meq | EXTENDED_RELEASE_TABLET | Freq: Once | ORAL | Status: DC

## 2020-02-09 MED ORDER — ACETAMINOPHEN 325 MG PO TABS: 650.0000 mg | ORAL_TABLET | ORAL | Status: DC | PRN

## 2020-02-09 MED ORDER — DEXTROSE 5 % IV SOLN
3.0000 g | INTRAVENOUS | Status: DC
  Filled 2020-02-09: qty 3000

## 2020-02-09 MED ORDER — POTASSIUM CHLORIDE 10 MEQ/100ML IV SOLN
10.0000 meq | INTRAVENOUS | Status: AC
  Administered 2020-02-09 (×4): 10 meq via INTRAVENOUS
  Filled 2020-02-09 (×4): qty 100

## 2020-02-09 MED ORDER — CEFAZOLIN (ANCEF) 1 G IV SOLR
3.0000 g | INTRAVENOUS | Status: DC
  Filled 2020-02-09: qty 3

## 2020-02-09 MED ORDER — MORPHINE SULFATE (PF) 2 MG/ML IV SOLN: 1.0000 mg | INTRAVENOUS | Status: DC | PRN

## 2020-02-09 NOTE — Consult Note (Deleted)
[facility] SURGICAL CONSULTATION NOTE (initial) - cpt: [id number]   HISTORY OF PRESENT ILLNESS (HPI):  19 y.o. G1P0000 female dated 15 weeks 2 days gestation by [place] who presented to [facility] ED yesterdy for evaluation of abdominal pain. Patient reports issue with hyperemesis throughout this pregnancy however in the last 2 days she reports that the emesis has become darker in color. She was concerned this may have been blood. In addition, she has complained of increasing epigastric and RUQ abdominal pain. This is an aching, burning pain which has radiated to her back and shoulder. She is unsure if the pain has any association with food. Decreased PO intake in the last few days as well. No history of similar pain in the past prior to her pregnancy. Work up in the ED was concerning for hypokalemia to 2.6, mild LFT elevation without hyperbilirubinemia, CXR and CTA which were normal ,and a RUQ [place] which showed possible cholelithiasis. She was admitted to OB/GYN for management of hyperemesis with GI and pulmonary consults. She continues to endorse pain and nausea and had recently eaten "an egg scramble" prior to our interview. She continues to be SOB and tachypneic throughout the conversation.   Surgery is consulted by gastroenterology physician Dr. Wyline Mood, MD in this context for evaluation and management of possible cholecystitis.  PAST MEDICAL HISTORY (PMH):  Past Medical History:  Diagnosis Date  . Hyperemesis 02/08/2020  . Obesity      PAST SURGICAL HISTORY (PSH):  History reviewed. No pertinent surgical history.   MEDICATIONS:  Prior to Admission medications   Medication Sig Start Date End Date Taking? Authorizing Provider  ondansetron (ZOFRAN) 4 MG tablet Take 1 tablet (4 mg total) by mouth daily as needed. 01/29/20 01/28/21 Yes Willy Eddy, MD  Doxylamine-Pyridoxine (DICLEGIS) 10-10 MG TBEC Take two tablets at bedtime on day 1 and 2; if symptoms persist, take 1 tab in AM and 2  tabs at bedtime day 3, then 1 tab every 6 hr PRN Patient not taking: Reported on 02/08/2020 01/10/20   Shaune Pollack, MD  pantoprazole (PROTONIX) 40 MG tablet Take 1 tablet (40 mg total) by mouth daily for 7 days. 01/10/20 01/17/20  Shaune Pollack, MD  potassium chloride (KLOR-CON) 10 MEQ tablet Take 2 tablets (20 mEq total) by mouth daily for 5 days. 01/10/20 01/15/20  Shaune Pollack, MD  Prenatal Vit-Fe Fumarate-FA (PRENATAL MULTIVITAMIN) TABS tablet Take 1 tablet by mouth daily at 12 noon.    [provider]     ALLERGIES:  No Known Allergies   SOCIAL HISTORY:  Social History   Socioeconomic History  . Marital status: Single    Spouse name: Not on file  . Number of children: Not on file  . Years of education: Not on file  . Highest education level: Not on file  Occupational History  . Not on file  Tobacco Use  . Smoking status: Never Smoker  . Smokeless tobacco: Never Used  Substance and Sexual Activity  . Alcohol use: Never  . Drug use: Never  . Sexual activity: Not on file  Other Topics Concern  . Not on file  Social History Narrative  . Not on file   Social Determinants of Health   Financial Resource Strain:   . Difficulty of Paying Living Expenses:   Food Insecurity:   . Worried About Programme researcher, broadcasting/film/video in the Last Year:   . Barista in the Last Year:   Transportation Needs:   .  Lack of Transportation (Medical):   Marland Kitchen Lack of Transportation (Non-Medical):   Physical Activity:   . Days of Exercise per Week:   . Minutes of Exercise per Session:   Stress:   . Feeling of Stress :   Social Connections:   . Frequency of Communication with Friends and Family:   . Frequency of Social Gatherings with Friends and Family:   . Attends Religious Services:   . Active Member of Clubs or Organizations:   . Attends Archivist Meetings:   Marland Kitchen Marital Status:   Intimate Partner Violence:   . Fear of Current or Ex-Partner:   . Emotionally Abused:   Marland Kitchen  Physically Abused:   . Sexually Abused:      FAMILY HISTORY:  No family history on file.    REVIEW OF SYSTEMS:  Review of Systems  Constitutional: Negative for chills and fever.  HENT: Negative for congestion and sore throat.   Respiratory: Positive for shortness of breath. Negative for cough.   Cardiovascular: Negative for chest pain and palpitations.  Gastrointestinal: Positive for abdominal pain, nausea and vomiting. Negative for blood in stool and constipation.  Genitourinary: Negative for dysuria and urgency.  All other systems reviewed and are negative.   VITAL SIGNS:  Temp:  [98.3 F (36.8 C)-99.4 F (37.4 C)] 98.4 F (36.9 C) (03/26 1116) Pulse Rate:  [80-93] 85 (03/26 1116) Resp:  [30-60] 42 (03/26 1116) BP: (102-122)/(52-76) 114/76 (03/26 1116) SpO2:  [97 %-100 %] 98 % (03/26 0802) Weight:  [121.5 kg] 121.5 kg (03/26 0900)     Height: 5\' 6"  (167.6 cm) Weight: 121.5 kg BMI (Calculated): 43.24   INTAKE/OUTPUT:  03/25 0701 - 03/26 0700 In: 1623.4 [I.V.:923.4; IV Piggyback:700] Out: 1100 [Urine:1100]  PHYSICAL EXAM:  Physical Exam Vitals and nursing note reviewed.  Constitutional:      Appearance: She is well-developed. She is obese. She is not ill-appearing.  HENT:     Head: Normocephalic and atraumatic.  Eyes:     General: No scleral icterus.    Extraocular Movements: Extraocular movements intact.  Cardiovascular:     Rate and Rhythm: Normal rate and regular rhythm.     Heart sounds: Normal heart sounds.  Pulmonary:     Effort: Tachypnea, accessory muscle usage and respiratory distress present.     Breath sounds: Normal breath sounds. No wheezing.     Comments: Patient is tachypneic and using accessory muscles throughout conversation and examination although lung sounds are clear to auscultation throughout all fields  Abdominal:     General: There is no distension.     Palpations: Abdomen is soft.     Tenderness: There is abdominal tenderness in the  right upper quadrant. There is no guarding or rebound. Positive signs include Murphy's sign.  Genitourinary:    Comments: deferred Skin:    General: Skin is warm and dry.     Coloration: Skin is not jaundiced or pale.  Neurological:     General: No focal deficit present.     Mental Status: She is alert and oriented to person, place, and time.  Psychiatric:        Mood and Affect: Mood normal.        Behavior: Behavior normal.      Labs:  CBC Latest Ref Rng & Units 02/09/2020 02/08/2020 02/08/2020  WBC 4.0 - 10.5 K/uL 8.4 10.1 10.5  Hemoglobin 12.0 - 15.0 g/dL 10.5(L) 11.2(L) 12.5  Hematocrit 36.0 - 46.0 % 30.3(L) 32.4(L) 35.6(L)  Platelets  150 - 400 K/uL 336 321 393   CMP Latest Ref Rng & Units 02/09/2020 02/09/2020 02/08/2020  Glucose 70 - 99 mg/dL 89 - 88  BUN 6 - 20 mg/dL <7(M) - 6  Creatinine 2.26 - 1.00 mg/dL 3.33 - 5.45  Sodium 625 - 145 mmol/L 137 138 137  Potassium 3.5 - 5.1 mmol/L 3.4(L) 2.9(L) 2.9(L)  Chloride 98 - 111 mmol/L 106 106 103  CO2 22 - 32 mmol/L 23 23 23   Calcium 8.9 - 10.3 mg/dL 9.2 - 9.2  Total Protein 6.5 - 8.1 g/dL 7.3 - 7.5  Total Bilirubin 0.3 - 1.2 mg/dL 0.8 - 1.1  Alkaline Phos 38 - 126 U/L 66 - 66  AST 15 - 41 U/L 125(H) 115(H) 104(H)  ALT 0 - 44 U/L 164(H) 147(H) 145(H)     Imaging studies:   RUQ (02/08/2020) personally reviewed showing possible cholelithiasis vs sludge without evidence of cholecystitis, and radiologist report reviewed:  IMPRESSION: 1. No acute findings. 2. Layering echogenic material in the gallbladder consistent with small stones and sludge. No acute cholecystitis. 3. No change from the recent prior right upper quadrant ultrasound.  Assessment/Plan: (ICD-10's: K81.9) 19 y.o. female with pretty significant RUQ abdominal pain and cholelithiasis on 12 concerning clinically for cholecystitis, complicated by pertinent comorbidities including second trimester pregnancy.    - Will make patient NPO  - Plan for robotic  assisted laparoscopic cholecystectomy with Dr [place] tomorrow pending OR/Anesthesia availability.   - All risks, benefits, and alternatives to above procedure(s) were discussed with the patient and she understands this may not completely resolve her symptoms, all of her questions were answered to her expressed satisfaction, patient expresses she wishes to proceed, and informed consent was obtained.  - ABx on call to OR  - Pain control prn; antiemetics prn  - monitor abdominal examination  - Further management per primary and consulting services  - DVT prophylaxis; hold for OR tomorrow  All of the above findings and recommendations were discussed with the patient, and all of patient's questions were answered to her expressed satisfaction.  Thank you for the opportunity to participate in this patient's care.   -- Claudine Mouton, PA-C [facility] 02/09/2020, 3:51 PM [phone] M-F: 7am - 4pm

## 2020-02-09 NOTE — Progress Notes (Signed)
S: Patient reports some improvement in how she feels since admission. She admits mild nausea and has not vomited overnight or this morning. Her diet has advanced to soft and she has ordered breakfast. She admits some substernal pain and throat pain both of which could be attributed to nausea/vomiting. She denies a history of asthma. She denies feeling anxious currently. She feels some fetal "fluttering".   The patient had a confirmation of pregnancy visit at [facility] at 9 weeks and has not had any prenatal care since. She has been seen in the ER twice for N/V. She would like to have care with [facility].   O: Vital Signs: BP 119/67 (BP Location: Left Arm)   Pulse 83   Temp 98.3 F (36.8 C) (Oral)   Resp (!) 36 Comment: nurse Rene Paci notified  Ht 5\' 6"  (1.676 m)   Wt 121.5 kg   LMP 10/17/2019 (Exact Date) Comment: pt pregnant was shielded for xray  SpO2 98% Comment: Room Air  BMI 43.22 kg/m  Constitutional: Well nourished, well developed female in no acute distress.  HEENT: normal Skin: Warm and dry.  Cardiovascular: Regular rate and rhythm.   Extremity: 2+ reflexes  Respiratory: Clear to auscultation bilateral. Increased respiratory effort with tachypnea. Abdomen: FHT present Neuro: DTRs 2+, Cranial nerves grossly intact Psych: Alert and Oriented x3. No memory deficits. Normal mood and affect.   Pelvic exam: deferred FHTs by doppler: 152 at 8:30 AM  Dating: LMP 10/17/2019 (exact per patient) with EDD 9/7 U/S on 01/29/20 based on BPD only: [redacted]w[redacted]d with EDD 9/13 U/S today based on BPD/HC/AC/FL: [redacted]w[redacted]d with EDD 9/15  Results for AARIN, SPARKMAN (MRN [name]) as of 02/09/2020 11:11  Ref. Range 02/09/2020 05:32 02/09/2020 10:10  Sodium Latest Ref Range: 135 - 145 mmol/L 138   Potassium Latest Ref Range: 3.5 - 5.1 mmol/L 2.9 (L)   Chloride Latest Ref Range: 98 - 111 mmol/L 106   CO2 Latest Ref Range: 22 - 32 mmol/L 23   Anion gap Latest Ref Range: 5 - 15  9   AST Latest Ref  Range: 15 - 41 U/L 115 (H)   ALT Latest Ref Range: 0 - 44 U/L 147 (H)   WBC Latest Ref Range: 4.0 - 10.5 K/uL 8.4   RBC Latest Ref Range: 3.87 - 5.11 MIL/uL 3.47 (L)   Hemoglobin Latest Ref Range: 12.0 - 15.0 g/dL 02/11/2020 (L)   HCT Latest Ref Range: 36.0 - 46.0 % 30.3 (L)   MCV Latest Ref Range: 80.0 - 100.0 fL 87.3   MCH Latest Ref Range: 26.0 - 34.0 pg 30.3   MCHC Latest Ref Range: 30.0 - 36.0 g/dL 66.4   RDW Latest Ref Range: 11.5 - 15.5 % 13.3   Platelets Latest Ref Range: 150 - 400 K/uL 336   nRBC Latest Ref Range: 0.0 - 0.2 % 0.0   40.3 OB COMP + 14 WK Unknown  Rpt   CLINICAL DATA:  Fetal dating.  EXAM: OBSTETRICAL ULTRASOUND >14 WKS AND TRANSVAGINAL OB US  FINDINGS: Number of Fetuses: 1  Heart Rate: 147 bpm  Movement: Present  Presentation: Breech  Previa: No  Placental Location: Anterior. There is a question of marginal or velamentous cord insertion again noted.  Amniotic Fluid (Subjective): Normal  Amniotic Fluid (Objective):  Vertical pocket 2.6 cmcm  FETAL BIOMETRY  BPD:  3.0cm 15w 3d  HC:    11.3cm 15w 3d  AC:    9.9cm 16w 0d  FL:  1.6cm 14w 5d  Current Mean GA: 15w 2d [place] EDC: 07/31/2020  Assigned GA: 16w 3d Assigned EDC: 07/23/2020  FETAL ANATOMY  Lateral Ventricles: Not visualized  Thalami/CSP: Appears normal  Posterior Fossa: Not visualized  Nuchal Region: Not visualized  Upper Lip: Not visualized  Spine: Not visualized  4 Chamber Heart on Left: Not visualized  LVOT: Not visualized  RVOT: Not visualized  Stomach on Left: Appears normal  3 Vessel Cord: Appears normal  Cord Insertion site: Not visualized  Kidneys: Not visualized  Bladder: Appears normal  Extremities: Appears normal  Technical Limitations: Limited exam due to early gestational age and maternal body habitus.  Maternal Findings:  Cervix:  3.5 cm and closed.  IMPRESSION: 1. Single viable intrauterine pregnancy at 15  weeks 2 days. Breech presentation.  2. Questionable marginal versus velamentous cord insertion again noted. Continued follow-up exam suggested.  Electronically Signed   By: Marcello Moores  Register   On: 02/09/2020 10:22  A: 19 y.o. female with IUP at [redacted]w[redacted]d by LMP with hyperemesis gravidarum, elevated liver enzymes indicating acute transaminitis, hypokalemia, tachypnea, Respiratory panel negative  P:  1. Hyperemesis: continue treating with IV fluids including daily Banana Bag with supplements, anti-emetics, antacids  2. Hypokalemia: continue to replete with IV and PO potassium with follow up labs  3. Tachypnea: consider medicine consult for worsening symptoms. Condition has been stable with some improvement overnight. Chest x-ray showed low lung volumes and atelectasis.  4. Transaminitis: Hepatitis panel negative, Liver Doppler US normal without evidence of thrombosis. Small gallstones and sludge noted. No acute cholecystitis.  GI recommendations to consider surgery consultation and MRCP if persistent pain and nausea.  5. Limited prenatal care: patient is encouraged to call [facility] to establish prenatal care, prenatal labs have been ordered including Hgb A1C and are pending. Will discuss best dating with Dr Glennon Mac. Will need anatomy scan in 4 weeks.    Christean Leaf, CNM [facility] 02/09/2020, 11:37 AM

## 2020-02-09 NOTE — Consult Note (Signed)
Name: Joanne Pacheco MRN: [id number] DOB: 2001-04-06     CONSULTATION DATE: 02/09/2020  REFERRING MD : Jean Rosenthal  CHIEF COMPLAINT: chest pain   STUDIES:     02/08/20  CT chest Independently reviewed by Me No evidence of pneumonia No evidence of PE  HISTORY OF PRESENT ILLNESS: 18 yo [redacted] weeks gestation presents to  ER for vomiting of blood for two days. She reports that any time she eats or drinks water she vomits penny sized clots in acidic fluid. She has not vomitted since she game to the hospital this morning, she reports that this is likely because she has not had any food to eat or drink since her arrival. She reports that she has been nauseous since she became pregnant which has been about 4 months.   +pleursiy for several weeks SOB for several days  Patient SOB upon entry to her room  CTA NEG FOR PE/PNEUMONIA  +CHEST WALL TENDERNESS MIDSTERNUM   GYN History Not using contraceptions when she got pregnant.  She has a history of irregular periods.  LMP 10/17/2019 Denies STD Denies fibrodis, polyps, ovarian cysts Denies history of endometriosis Denies  Any previous pregnancies.   CASE DISCUSSED WITH DR Thomasene Mohair Can give MORPHINE AS NEEDED PERCOCET AS NEEDED PREDNISONE 20 mg DAILY FOR 7 days      PAST MEDICAL HISTORY :   has a past medical history of Hyperemesis (02/08/2020).  has no past surgical history on file. Prior to Admission medications   Medication Sig Start Date End Date Taking? Authorizing Provider  ondansetron (ZOFRAN) 4 MG tablet Take 1 tablet (4 mg total) by mouth daily as needed. 01/29/20 01/28/21 Yes Willy Eddy, MD  Doxylamine-Pyridoxine (DICLEGIS) 10-10 MG TBEC Take two tablets at bedtime on day 1 and 2; if symptoms persist, take 1 tab in AM and 2 tabs at bedtime day 3, then 1 tab every 6 hr PRN Patient not taking: Reported on 02/08/2020 01/10/20   Shaune Pollack, MD  pantoprazole (PROTONIX) 40 MG tablet Take 1 tablet (40 mg total) by  mouth daily for 7 days. 01/10/20 01/17/20  Shaune Pollack, MD  potassium chloride (KLOR-CON) 10 MEQ tablet Take 2 tablets (20 mEq total) by mouth daily for 5 days. 01/10/20 01/15/20  Shaune Pollack, MD  Prenatal Vit-Fe Fumarate-FA (PRENATAL MULTIVITAMIN) TABS tablet Take 1 tablet by mouth daily at 12 noon.    [provider]   No Known Allergies  FAMILY HISTORY:  family history is not on file. SOCIAL HISTORY:  reports that she has never smoked. She has never used smokeless tobacco. She reports that she does not drink alcohol or use drugs.    Review of Systems:  Gen:  Denies  fever, sweats, chills weigh loss  HEENT: Denies blurred vision, double vision, ear pain, eye pain, hearing loss, nose bleeds, sore throat Cardiac: +chest pain +chest tightness, Resp:   No cough, -sputum production, -shortness of breath,-wheezing, -hemoptysis,  Gi: Denies swallowing difficulty, stomach pain, nausea or vomiting, diarrhea, constipation, bowel incontinence Gu:  Denies bladder incontinence, burning urine Ext:   Denies Joint pain, stiffness or swelling Skin: Denies  skin rash, easy bruising or bleeding or hives Endoc:  Denies polyuria, polydipsia , polyphagia or weight change Psych:   Denies depression, insomnia or hallucinations  Other:  All other systems negative     VITAL SIGNS: Temp:  [98.3 F (36.8 C)-99.4 F (37.4 C)] 98.4 F (36.9 C) (03/26 1116) Pulse Rate:  [74-93] 85 (03/26 1116) Resp:  [23-60]  42 (03/26 1116) BP: (102-123)/(52-76) 114/76 (03/26 1116) SpO2:  [96 %-100 %] 98 % (03/26 0802) Weight:  [121.5 kg] 121.5 kg (03/26 0900)     SpO2: 98 %(Room Air)   Physical Examination:   General Appearance: + distress  Neuro:without focal findings,  speech normal,  HEENT: PERRLA, EOM intact.   Pulmonary: normal breath sounds, No wheezing.  Cardiovascular+CHEST WALL TENDERNESS Abdomen: Benign, Soft, non-tender. Renal:  No costovertebral tenderness  GU:  Not performed at this  time. Endoc: No evident thyromegaly Skin:   warm, no rashes, no ecchymosis  Extremities: normal, no cyanosis, clubbing. PSYCHIATRIC: Mood, affect within normal limits.   ALL OTHER ROS ARE NEGATIVE    MEDICATIONS: I have reviewed all medications and confirmed regimen as documented    CULTURE RESULTS   Recent Results (from the past 240 hour(s))  Respiratory Panel by RT PCR (Flu A&B, Covid) - Nasopharyngeal Swab     Status: None   Collection Time: 02/08/20 11:14 AM   Specimen: Nasopharyngeal Swab  Result Value Ref Range Status   SARS Coronavirus 2 by RT PCR NEGATIVE NEGATIVE Final    Comment: (NOTE) SARS-CoV-2 target nucleic acids are NOT DETECTED. The SARS-CoV-2 RNA is generally detectable in upper respiratoy specimens during the acute phase of infection. The lowest concentration of SARS-CoV-2 viral copies this assay can detect is 131 copies/mL. A negative result does not preclude SARS-Cov-2 infection and should not be used as the sole basis for treatment or other patient management decisions. A negative result may occur with  improper specimen collection/handling, submission of specimen other than nasopharyngeal swab, presence of viral mutation(s) within the areas targeted by this assay, and inadequate number of viral copies (<131 copies/mL). A negative result must be combined with clinical observations, patient history, and epidemiological information. The expected result is Negative. Fact Sheet for Patients:  PinkCheek.be Fact Sheet for Healthcare Providers:  GravelBags.it This test is not yet ap proved or cleared by the [place] FDA and  has been authorized for detection and/or diagnosis of SARS-CoV-2 by FDA under an Emergency Use Authorization (EUA). This EUA will remain  in effect (meaning this test can be used) for the duration of the COVID-19 declaration under Section 564(b)(1) of the Act, 21  U.S.C. section 360bbb-3(b)(1), unless the authorization is terminated or revoked sooner.    Influenza A by PCR NEGATIVE NEGATIVE Final   Influenza B by PCR NEGATIVE NEGATIVE Final    Comment: (NOTE) The Xpert Xpress SARS-CoV-2/FLU/RSV assay is intended as an aid in  the diagnosis of influenza from Nasopharyngeal swab specimens and  should not be used as a sole basis for treatment. Nasal washings and  aspirates are unacceptable for Xpert Xpress SARS-CoV-2/FLU/RSV  testing. Fact Sheet for Patients: PinkCheek.be Fact Sheet for Healthcare Providers: GravelBags.it This test is not yet approved or cleared by the [place] FDA and  has been authorized for detection and/or diagnosis of SARS-CoV-2 by  FDA under an Emergency Use Authorization (EUA). This EUA will remain  in effect (meaning this test can be used) for the duration of the  Covid-19 declaration under Section 564(b)(1) of the Act, 21  U.S.C. section 360bbb-3(b)(1), unless the authorization is  terminated or revoked. Performed at [facility], [address] [place], [place] [address]           IMAGING    [place] Comp + 14 Wk  Result Date: 02/09/2020 CLINICAL DATA:  Fetal dating. EXAM: OBSTETRICAL ULTRASOUND >14 WKS AND TRANSVAGINAL OB US  FINDINGS: Number of Fetuses: 1 Heart Rate: 147 bpm Movement: Present Presentation: Breech Previa: No Placental Location: Anterior. There is a question of marginal or velamentous cord insertion again noted. Amniotic Fluid (Subjective): Normal Amniotic Fluid (Objective): Vertical pocket 2.6 cmcm FETAL BIOMETRY BPD:  3.0cm 15w 3d HC:    11.3cm 15w 3d AC:    9.9cm 16w 0d FL:    1.6cm 14w 5d Current Mean GA: 15w 2d [place] EDC: 07/31/2020 Assigned GA: 16w 3d Assigned EDC: 07/23/2020 FETAL ANATOMY Lateral Ventricles: Not visualized Thalami/CSP: Appears normal Posterior Fossa: Not visualized Nuchal Region: Not visualized Upper Lip: Not  visualized Spine: Not visualized 4 Chamber Heart on Left: Not visualized LVOT: Not visualized RVOT: Not visualized Stomach on Left: Appears normal 3 Vessel Cord: Appears normal Cord Insertion site: Not visualized Kidneys: Not visualized Bladder: Appears normal Extremities: Appears normal Technical Limitations: Limited exam due to early gestational age and maternal body habitus. Maternal Findings: Cervix:  3.5 cm and closed. IMPRESSION: 1. Single viable intrauterine pregnancy at 15 weeks 2 days. Breech presentation. 2. Questionable marginal versus velamentous cord insertion again noted. Continued follow-up exam suggested. Electronically Signed   By: Maisie Fus  Register   On: 02/09/2020 10:22       ASSESSMENT AND PLAN SYNOPSIS  19 yo AAF with acute chest pain and SOB Upon physical examination, patient extreme pain with palpation of chest wall midsternum c/w COSTOCHONDRITIS, patient does NOT have elevated lactic acid, ABG is not concerning shows RESP ALKYLOSIS can be from pain/anxiety, patient does NOT have PE, EKG shows NSR, she is NOT hypoxic.  Morphine 1-2 mg every 4 hrs as needed Percocet 325/5 every 4 hrs as needed PREDNISONE 10 mg daily for 7 days  MEDICATIONS DISCUSSED WITH DR Jean Rosenthal WILL AVOID NSAIDS FOR NOW  Check UDS prior to giving meds Follow up GI recs Consider SURG consult for GB disease   Sydney Hasten Santiago Glad, M.D.  [facility]  Medical Director [facility] Medical Director [facility] Cardio-Pulmonary Department

## 2020-02-09 NOTE — Progress Notes (Signed)
Joanne Pacheco , MD [address], [place], [place], [place], [address] [address], [place], [place], [place], [address] Phone: [phone]  Fax: [phone]   Joanne Pacheco is being followed for hematemesis  Day 2 of follow up   Subjective: Feels very short of breath , chest pain    Objective: Vital signs in last 24 hours: Vitals:   02/09/20 0425 02/09/20 0802 02/09/20 0900 02/09/20 1116  BP: 102/60 119/67  114/76  Pulse: 80 83  85  Resp: (!) 30 (!) 36  (!) 42  Temp: 98.4 F (36.9 C) 98.3 F (36.8 C)  98.4 F (36.9 C)  TempSrc: Oral Oral  Oral  SpO2: 98% 98%    Weight:   121.5 kg   Height:       Weight change:   Intake/Output Summary (Last 24 hours) at 02/09/2020 1443 Last data filed at 02/09/2020 1230 Gross per 24 hour  Intake 1584.82 ml  Output 1450 ml  Net 134.82 ml     Exam: Heart:: Regular rate and rhythm or S1S2 present Lungs: normal, clear to auscultation and clear to auscultation and percussion Abdomen: RUQ tenderness , BS+   Lab Results: @LABTEST2 @ Micro Results: Recent Results (from the past 240 hour(s))  Respiratory Panel by RT PCR (Flu A&B, Covid) - Nasopharyngeal Swab     Status: None   Collection Time: 02/08/20 11:14 AM   Specimen: Nasopharyngeal Swab  Result Value Ref Range Status   SARS Coronavirus 2 by RT PCR NEGATIVE NEGATIVE Final    Comment: (NOTE) SARS-CoV-2 target nucleic acids are NOT DETECTED. The SARS-CoV-2 RNA is generally detectable in upper respiratoy specimens during the acute phase of infection. The lowest concentration of SARS-CoV-2 viral copies this assay can detect is 131 copies/mL. A negative result does not preclude SARS-Cov-2 infection and should not be used as the sole basis for treatment or other patient management decisions. A negative result may occur with  improper specimen collection/handling, submission of specimen other than nasopharyngeal swab, presence of viral mutation(s) within the areas targeted  by this assay, and inadequate number of viral copies (<131 copies/mL). A negative result must be combined with clinical observations, patient history, and epidemiological information. The expected result is Negative. Fact Sheet for Patients:  PinkCheek.be Fact Sheet for Healthcare Providers:  GravelBags.it This test is not yet ap proved or cleared by the [place] FDA and  has been authorized for detection and/or diagnosis of SARS-CoV-2 by FDA under an Emergency Use Authorization (EUA). This EUA will remain  in effect (meaning this test can be used) for the duration of the COVID-19 declaration under Section 564(b)(1) of the Act, 21 U.S.C. section 360bbb-3(b)(1), unless the authorization is terminated or revoked sooner.    Influenza A by PCR NEGATIVE NEGATIVE Final   Influenza B by PCR NEGATIVE NEGATIVE Final    Comment: (NOTE) The Xpert Xpress SARS-CoV-2/FLU/RSV assay is intended as an aid in  the diagnosis of influenza from Nasopharyngeal swab specimens and  should not be used as a sole basis for treatment. Nasal washings and  aspirates are unacceptable for Xpert Xpress SARS-CoV-2/FLU/RSV  testing. Fact Sheet for Patients: PinkCheek.be Fact Sheet for Healthcare Providers: GravelBags.it This test is not yet approved or cleared by the [place] FDA and  has been authorized for detection and/or diagnosis of SARS-CoV-2 by  FDA under an Emergency Use Authorization (EUA). This EUA will remain  in effect (meaning this test can be used) for the duration of the  Covid-19  declaration under Section 564(b)(1) of the Act, 21  U.S.C. section 360bbb-3(b)(1), unless the authorization is  terminated or revoked. Performed at [facility], [address], [place], [place] [address]    Studies/Results: CT Angio Chest PE W and/or Wo Contrast  Result Date:  02/08/2020 CLINICAL DATA:  Tachypnea and short of breath following emesis. Cough. [redacted] weeks pregnant EXAM: CT ANGIOGRAPHY CHEST WITH CONTRAST TECHNIQUE: Multidetector CT imaging of the chest was performed using the standard protocol during bolus administration of intravenous contrast. Multiplanar CT image reconstructions and MIPs were obtained to evaluate the vascular anatomy. CONTRAST:  43mL OMNIPAQUE IOHEXOL 350 MG/ML SOLN COMPARISON:  Chest 02/08/2020 FINDINGS: Cardiovascular: Satisfactory opacification of the pulmonary arteries to the segmental level. No evidence of pulmonary embolism. Normal heart size. No pericardial effusion. Normal thoracic aorta. Heart size normal. Mediastinum/Nodes: Negative for mass or adenopathy. Lungs/Pleura: Lungs well aerated and clear. No infiltrate effusion or mass. Upper Abdomen: No acute abnormality. Musculoskeletal: Negative Review of the MIP images confirms the above findings. IMPRESSION: Negative CTA chest. Electronically Signed   By: Marlan Palau M.D.   On: 02/08/2020 13:34   US OB Comp + 14 Wk  Result Date: 02/09/2020 CLINICAL DATA:  Fetal dating. EXAM: OBSTETRICAL ULTRASOUND >14 WKS AND TRANSVAGINAL OB US FINDINGS: Number of Fetuses: 1 Heart Rate: 147 bpm Movement: Present Presentation: Breech Previa: No Placental Location: Anterior. There is a question of marginal or velamentous cord insertion again noted. Amniotic Fluid (Subjective): Normal Amniotic Fluid (Objective): Vertical pocket 2.6 cmcm FETAL BIOMETRY BPD:  3.0cm 15w 3d HC:    11.3cm 15w 3d AC:    9.9cm 16w 0d FL:    1.6cm 14w 5d Current Mean GA: 15w 2d [place] EDC: 07/31/2020 Assigned GA: 16w 3d Assigned EDC: 07/23/2020 FETAL ANATOMY Lateral Ventricles: Not visualized Thalami/CSP: Appears normal Posterior Fossa: Not visualized Nuchal Region: Not visualized Upper Lip: Not visualized Spine: Not visualized 4 Chamber Heart on Left: Not visualized LVOT: Not visualized RVOT: Not visualized Stomach on Left: Appears normal  3 Vessel Cord: Appears normal Cord Insertion site: Not visualized Kidneys: Not visualized Bladder: Appears normal Extremities: Appears normal Technical Limitations: Limited exam due to early gestational age and maternal body habitus. Maternal Findings: Cervix:  3.5 cm and closed. IMPRESSION: 1. Single viable intrauterine pregnancy at 15 weeks 2 days. Breech presentation. 2. Questionable marginal versus velamentous cord insertion again noted. Continued follow-up exam suggested. Electronically Signed   By: Maisie Fus  Register   On: 02/09/2020 10:22   DG Chest Portable 1 View  Result Date: 02/08/2020 CLINICAL DATA:  Chest pain. Epigastric pain and hematemesis. Sixteen weeks pregnant. EXAM: PORTABLE CHEST 1 VIEW COMPARISON:  01/29/2020 FINDINGS: The cardiac silhouette is accentuated by hypoinflation. Lung volumes are low with bronchovascular crowding in the perihilar and basilar regions. No confluent airspace opacity, overt pulmonary edema, pleural effusion, pneumothorax is identified. No acute osseous abnormality is seen. IMPRESSION: Low lung volumes with bibasilar atelectasis. Electronically Signed   By: Sebastian Ache M.D.   On: 02/08/2020 11:06   US LIVER DOPPLER  Result Date: 02/08/2020 CLINICAL DATA:  Abnormal liver function tests. EXAM: DUPLEX ULTRASOUND OF LIVER TECHNIQUE: Color and duplex Doppler ultrasound was performed to evaluate the hepatic in-flow and out-flow vessels. COMPARISON:  None. FINDINGS: Liver: Normal parenchymal echogenicity. Normal hepatic contour without nodularity. No focal lesion, mass or intrahepatic biliary ductal dilatation. Main Portal Vein size: 3 cm Portal Vein Velocities Main Prox:  43 cm/sec Main Mid: 33 cm/sec Main Dist:  30 cm/sec Right: 18  cm/sec Left: 20 cm/sec Normal hepatopetal flow is noted in the portal veins. Hepatic Vein Velocities Right:  18 cm/sec Middle:  23 cm/sec Left:  14 cm/sec Normal hepatofugal flow is noted in the hepatic veins. IVC: Present and patent with  normal respiratory phasicity. Hepatic Artery Velocity:  53 cm/sec Splenic Vein Velocity:  21 cm/sec Spleen: 8.3 cm x 8.5 cm x 4.9 cm with a total volume of 182 cm^3 (411 cm^3 is upper limit normal) Portal Vein Occlusion/Thrombus: No Splenic Vein Occlusion/Thrombus: No Ascites: None Varices: None IMPRESSION: No evidence of hepatic, portal or splenic venous thrombosis or occlusion. Electronically Signed   By: Lupita Raider M.D.   On: 02/08/2020 15:23   US Abdomen Limited RUQ  Result Date: 02/08/2020 CLINICAL DATA:  Right upper quadrant and epigastric pain. EXAM: ULTRASOUND ABDOMEN LIMITED RIGHT UPPER QUADRANT COMPARISON:  Right upper quadrant ultrasound, 01/29/2020. FINDINGS: Gallbladder: Echogenic material layers dependently in the gallbladder consistent with small stones and sludge, unchanged from the prior study. No wall thickening. No pericholecystic fluid. Common bile duct: Diameter: 3-4 mm. Liver: No focal lesion identified. Within normal limits in parenchymal echogenicity. Portal vein is patent on color Doppler imaging with normal direction of blood flow towards the liver. Other: None. IMPRESSION: 1. No acute findings. 2. Layering echogenic material in the gallbladder consistent with small stones and sludge. No acute cholecystitis. 3. No change from the recent prior right upper quadrant ultrasound. Electronically Signed   By: Amie Portland M.D.   On: 02/08/2020 11:29   Medications: I have reviewed the patient's current medications. Scheduled Meds: . docusate sodium  100 mg Oral BID  . doxylamine (Sleep)  12.5 mg Oral BID   And  . vitamin B-6  25 mg Oral BID  . pantoprazole (PROTONIX) IV  40 mg Intravenous Q12H  . potassium chloride  40 mEq Oral Once  . prenatal multivitamin  1 tablet Oral Q1200  . promethazine  12.5 mg Intravenous Q6H  . sucralfate  1 g Oral TID WC & HS   Continuous Infusions: . lactated ringers Stopped (02/09/20 1228)   PRN Meds:.acetaminophen,  ondansetron   Assessment: Principal Problem:   Hematemesis Active Problems:   Hyperemesis   Hypokalemia   Tachypnea   Abnormal LFTs  Joanne Pacheco is a 19 y.o. y/o female who is [redacted] weeks pregnant presents to the emergency room with hematemesis and abdominal pain.  Hemoglobin is at baseline no elevation in BUN/creatinine ratio.  Is possible she has had a Mallory-Weiss tear.  Also noted to have elevated transaminases.Acute hepatitis panel ,COVID test is negative, doppler USG, tylenol level  is normal . Hb 10.5 grams today . Being evaluated for tachypnea   Plan 1.  IV hydration monitor CBC. 2.  Treat nausea and vomiting. 3.  f/u labs for  viral hepatitis, check complete LFT's including T bil , creatinine.  4.   In terms of endoscopy decision to be made if the benefits of endoscopy and anesthesia outweigh the risks. Presently being evaluated for chest pains/Tachyapnea . No further overt bleeding . Will add sucralfate to PPI for any esophagitis. Can decide on endoscopy once breathing issues have resolved.  5. RUQ pain and has gallstones with  recurrent vomiting. I will call surgery for  evaluation by  to  determine if her nausea and vomiting could be related to chronic cholecystitis/biliary colic.  If pain/nausea persists then may need to consider an MRCP to rule out stones in the common bile duct occasionally she might  of passed the stone leading to elevation in transaminases as well.     LOS: 1 day   Wyline Mood, MD 02/09/2020, 2:43 PM

## 2020-02-09 NOTE — Consult Note (Signed)
Medical Consultation   Joanne Pacheco  ZOX:[id number]  DOB: Jan 31, 2001  DOA: 02/08/2020  PCP: [facility]   Outpatient Specialists:   Requesting physician: NP, Erskine SquibbJane from OB/Gyn  Reason for consultation: -Tachypnea, pleuritic chest pain, shortness breath     History of Present Illness: Joanne Pacheco is an 19 y.o. female with hx of [redacted] weeks gestation, initially admitted by Ob/Gyn due to hematemesis, developed pleuritic chest pain, shortness of breath and tachypnea.  We are asked to consult.  Pt was admitted 2 days ago due to hematemesis.  GI saw pt. Pt is being treated with IV Protonix and Carafate. Hgb stable 10.5 today. Pt states that she has left-sided pleuritic chest pain, which is a sharp, 6 out of 10 severity, nonradiating, aggravated by deep breath and coughing.  She has mild dry cough and shortness of breath.  Patient is very tachypneic with RR 40 to 60s. CTA is negative for PE and infiltration.  Patient states that she has nausea, but currently no vomiting, diarrhea or abdominal pain.  No symptoms of UTI or unilateral weakness.  No vaginal bleeding or discharge.  Patient has oxygen saturation 95% on room air.  Review of Systems:  General: no fevers, chills, no changes in body weight, no changes in appetite Skin: no rash HEENT: no blurry vision, hearing changes or sore throat Pulm: has dyspnea, coughing, no wheezing CV: has chest pain, no palpitations Chest wall: has left-sided chest wall tenderness Abd: has nausea and hematemesis, no diarrhea or abdominal pain.  GU: no dysuria, hematuria, polyuria Ext: no arthralgias, myalgias Neuro: no weakness, numbness, or tingling    Past Medical History: Past Medical History:  Diagnosis Date  . Hyperemesis 02/08/2020  . Obesity     Past Surgical History: History reviewed. No pertinent surgical history.   Allergies:  No Known Allergies   Social History:  reports that she has  never smoked. She has never used smokeless tobacco. She reports that she does not drink alcohol or use drugs.  Family History: -Reviewed with patient, the patient states that all family members are healthy.  Physical Exam: Vitals:   02/09/20 0425 02/09/20 0802 02/09/20 0900 02/09/20 1116  BP: 102/60 119/67  114/76  Pulse: 80 83  85  Resp: (!) 30 (!) 36  (!) 42  Temp: 98.4 F (36.9 C) 98.3 F (36.8 C)  98.4 F (36.9 C)  TempSrc: Oral Oral  Oral  SpO2: 98% 98%    Weight:   121.5 kg   Height:       General: Not in acute distress HEENT: PERRL, EOMI, no scleral icterus, No JVD or bruit Cardiac: S1/S2, RRR, No murmurs, gallops or rubs Pulm:  No rales, wheezing, rhonchi or rubs. Abd: Soft, nondistended, nontender, no rebound pain, no organomegaly, BS present Ext: No edema. 2+DP/PT pulse bilaterally Musculoskeletal: No joint deformities, erythema, or stiffness, ROM full Skin: No rashes.  Neuro: Alert and oriented X3, cranial nerves II-XII grossly intact. Psych: Patient is not psychotic, no suicidal or hemocidal ideation.  Data reviewed:  I have personally reviewed following labs and imaging studies Labs:  CBC: Recent Labs  Lab 02/08/20 0855 02/08/20 1306 02/09/20 0532  WBC 10.5 10.1 8.4  HGB 12.5 11.2* 10.5*  HCT 35.6* 32.4* 30.3*  MCV 85.2 87.1 87.3  PLT 393 321 336    Basic Metabolic Panel: Recent Labs  Lab 02/08/20 0855 02/08/20 0855 02/08/20 1046  02/08/20 1346 02/08/20 1346 02/09/20 0532 02/09/20 1504  NA 136  --   --  137  --  138 137  K 2.6*   < >  --  2.9*   < > 2.9* 3.4*  CL 104  --   --  103  --  106 106  CO2 21*  --   --  23  --  23 23  GLUCOSE 97  --   --  88  --   --  89  BUN 8  --   --  6  --   --  <5*  CREATININE 0.40*  --   --  0.46  --   --  0.47  CALCIUM 9.4  --   --  9.2  --   --  9.2  MG  --   --  2.2  --   --   --   --    < > = values in this interval not displayed.   GFR Estimated Creatinine Clearance: 150.3 mL/min (by C-G formula based  on SCr of 0.47 mg/dL). Liver Function Tests: Recent Labs  Lab 02/08/20 0855 02/08/20 1346 02/09/20 0532 02/09/20 1504  AST 111* 104* 115* 125*  ALT 156* 145* 147* 164*  ALKPHOS 71 66  --  66  BILITOT 1.0 1.1  --  0.8  PROT 8.3* 7.5  --  7.3  ALBUMIN 3.9 3.6  --  3.4*   Recent Labs  Lab 02/08/20 0855  LIPASE 40   No results for input(s): AMMONIA in the last 168 hours. Coagulation profile Recent Labs  Lab 02/08/20 1114  INR 1.0    Cardiac Enzymes: No results for input(s): CKTOTAL, CKMB, CKMBINDEX, TROPONINI in the last 168 hours. BNP: Invalid input(s): POCBNP CBG: No results for input(s): GLUCAP in the last 168 hours. D-Dimer No results for input(s): DDIMER in the last 72 hours. Hgb A1c No results for input(s): HGBA1C in the last 72 hours. Lipid Profile No results for input(s): CHOL, HDL, LDLCALC, TRIG, CHOLHDL, LDLDIRECT in the last 72 hours. Thyroid function studies No results for input(s): TSH, T4TOTAL, T3FREE, THYROIDAB in the last 72 hours.  Invalid input(s): FREET3 Anemia work up No results for input(s): VITAMINB12, FOLATE, FERRITIN, TIBC, IRON, RETICCTPCT in the last 72 hours. Urinalysis    Component Value Date/Time   COLORURINE AMBER (A) 02/08/2020 1625   APPEARANCEUR HAZY (A) 02/08/2020 1625   LABSPEC >1.046 (H) 02/08/2020 1625   PHURINE 6.0 02/08/2020 1625   GLUCOSEU NEGATIVE 02/08/2020 1625   HGBUR NEGATIVE 02/08/2020 1625   BILIRUBINUR NEGATIVE 02/08/2020 1625   KETONESUR 80 (A) 02/08/2020 1625   PROTEINUR 30 (A) 02/08/2020 1625   NITRITE NEGATIVE 02/08/2020 1625   LEUKOCYTESUR SMALL (A) 02/08/2020 1625     Microbiology Recent Results (from the past 240 hour(s))  Respiratory Panel by RT PCR (Flu A&B, Covid) - Nasopharyngeal Swab     Status: None   Collection Time: 02/08/20 11:14 AM   Specimen: Nasopharyngeal Swab  Result Value Ref Range Status   SARS Coronavirus 2 by RT PCR NEGATIVE NEGATIVE Final    Comment: (NOTE) SARS-CoV-2 target  nucleic acids are NOT DETECTED. The SARS-CoV-2 RNA is generally detectable in upper respiratoy specimens during the acute phase of infection. The lowest concentration of SARS-CoV-2 viral copies this assay can detect is 131 copies/mL. A negative result does not preclude SARS-Cov-2 infection and should not be used as the sole basis for treatment or other patient management decisions. A negative result  may occur with  improper specimen collection/handling, submission of specimen other than nasopharyngeal swab, presence of viral mutation(s) within the areas targeted by this assay, and inadequate number of viral copies (<131 copies/mL). A negative result must be combined with clinical observations, patient history, and epidemiological information. The expected result is Negative. Fact Sheet for Patients:  https://www.moore.com/ Fact Sheet for Healthcare Providers:  https://www.young.biz/ This test is not yet ap proved or cleared by the [place] FDA and  has been authorized for detection and/or diagnosis of SARS-CoV-2 by FDA under an Emergency Use Authorization (EUA). This EUA will remain  in effect (meaning this test can be used) for the duration of the COVID-19 declaration under Section 564(b)(1) of the Act, 21 U.S.C. section 360bbb-3(b)(1), unless the authorization is terminated or revoked sooner.    Influenza A by PCR NEGATIVE NEGATIVE Final   Influenza B by PCR NEGATIVE NEGATIVE Final    Comment: (NOTE) The Xpert Xpress SARS-CoV-2/FLU/RSV assay is intended as an aid in  the diagnosis of influenza from Nasopharyngeal swab specimens and  should not be used as a sole basis for treatment. Nasal washings and  aspirates are unacceptable for Xpert Xpress SARS-CoV-2/FLU/RSV  testing. Fact Sheet for Patients: https://www.moore.com/ Fact Sheet for Healthcare Providers: https://www.young.biz/ This test is not  yet approved or cleared by the [place] FDA and  has been authorized for detection and/or diagnosis of SARS-CoV-2 by  FDA under an Emergency Use Authorization (EUA). This EUA will remain  in effect (meaning this test can be used) for the duration of the  Covid-19 declaration under Section 564(b)(1) of the Act, 21  U.S.C. section 360bbb-3(b)(1), unless the authorization is  terminated or revoked. Performed at [facility], [address] [place], [place] [address]        Inpatient Medications:   Scheduled Meds: . docusate sodium  100 mg Oral BID  . doxylamine (Sleep)  12.5 mg Oral BID   And  . vitamin B-6  25 mg Oral BID  . pantoprazole (PROTONIX) IV  40 mg Intravenous Q12H  . [START ON 02/10/2020] predniSONE  10 mg Oral Q breakfast  . prenatal multivitamin  1 tablet Oral Q1200  . promethazine  12.5 mg Intravenous Q6H  . sucralfate  1 g Oral TID WC & HS   Continuous Infusions: . sodium chloride    . lactated ringers Stopped (02/09/20 1508)  . potassium chloride 10 mEq (02/09/20 1508)     Radiological Exams on Admission: CT Angio Chest PE W and/or Wo Contrast  Result Date: 02/08/2020 CLINICAL DATA:  Tachypnea and short of breath following emesis. Cough. [redacted] weeks pregnant EXAM: CT ANGIOGRAPHY CHEST WITH CONTRAST TECHNIQUE: Multidetector CT imaging of the chest was performed using the standard protocol during bolus administration of intravenous contrast. Multiplanar CT image reconstructions and MIPs were obtained to evaluate the vascular anatomy. CONTRAST:  16mL OMNIPAQUE IOHEXOL 350 MG/ML SOLN COMPARISON:  Chest 02/08/2020 FINDINGS: Cardiovascular: Satisfactory opacification of the pulmonary arteries to the segmental level. No evidence of pulmonary embolism. Normal heart size. No pericardial effusion. Normal thoracic aorta. Heart size normal. Mediastinum/Nodes: Negative for mass or adenopathy. Lungs/Pleura: Lungs well aerated and clear. No infiltrate effusion or mass.  Upper Abdomen: No acute abnormality. Musculoskeletal: Negative Review of the MIP images confirms the above findings. IMPRESSION: Negative CTA chest. Electronically Signed   By: Marlan Palau M.D.   On: 02/08/2020 13:34   US OB Comp + 14 Wk  Result Date: 02/09/2020 CLINICAL DATA:  Fetal dating.  EXAM: OBSTETRICAL ULTRASOUND >14 WKS AND TRANSVAGINAL OB US FINDINGS: Number of Fetuses: 1 Heart Rate: 147 bpm Movement: Present Presentation: Breech Previa: No Placental Location: Anterior. There is a question of marginal or velamentous cord insertion again noted. Amniotic Fluid (Subjective): Normal Amniotic Fluid (Objective): Vertical pocket 2.6 cmcm FETAL BIOMETRY BPD:  3.0cm 15w 3d HC:    11.3cm 15w 3d AC:    9.9cm 16w 0d FL:    1.6cm 14w 5d Current Mean GA: 15w 2d [place] EDC: 07/31/2020 Assigned GA: 16w 3d Assigned EDC: 07/23/2020 FETAL ANATOMY Lateral Ventricles: Not visualized Thalami/CSP: Appears normal Posterior Fossa: Not visualized Nuchal Region: Not visualized Upper Lip: Not visualized Spine: Not visualized 4 Chamber Heart on Left: Not visualized LVOT: Not visualized RVOT: Not visualized Stomach on Left: Appears normal 3 Vessel Cord: Appears normal Cord Insertion site: Not visualized Kidneys: Not visualized Bladder: Appears normal Extremities: Appears normal Technical Limitations: Limited exam due to early gestational age and maternal body habitus. Maternal Findings: Cervix:  3.5 cm and closed. IMPRESSION: 1. Single viable intrauterine pregnancy at 15 weeks 2 days. Breech presentation. 2. Questionable marginal versus velamentous cord insertion again noted. Continued follow-up exam suggested. Electronically Signed   By: Maisie Fus  Register   On: 02/09/2020 10:22   DG Chest Portable 1 View  Result Date: 02/08/2020 CLINICAL DATA:  Chest pain. Epigastric pain and hematemesis. Sixteen weeks pregnant. EXAM: PORTABLE CHEST 1 VIEW COMPARISON:  01/29/2020 FINDINGS: The cardiac silhouette is accentuated by hypoinflation.  Lung volumes are low with bronchovascular crowding in the perihilar and basilar regions. No confluent airspace opacity, overt pulmonary edema, pleural effusion, pneumothorax is identified. No acute osseous abnormality is seen. IMPRESSION: Low lung volumes with bibasilar atelectasis. Electronically Signed   By: Sebastian Ache M.D.   On: 02/08/2020 11:06   US LIVER DOPPLER  Result Date: 02/08/2020 CLINICAL DATA:  Abnormal liver function tests. EXAM: DUPLEX ULTRASOUND OF LIVER TECHNIQUE: Color and duplex Doppler ultrasound was performed to evaluate the hepatic in-flow and out-flow vessels. COMPARISON:  None. FINDINGS: Liver: Normal parenchymal echogenicity. Normal hepatic contour without nodularity. No focal lesion, mass or intrahepatic biliary ductal dilatation. Main Portal Vein size: 3 cm Portal Vein Velocities Main Prox:  43 cm/sec Main Mid: 33 cm/sec Main Dist:  30 cm/sec Right: 18 cm/sec Left: 20 cm/sec Normal hepatopetal flow is noted in the portal veins. Hepatic Vein Velocities Right:  18 cm/sec Middle:  23 cm/sec Left:  14 cm/sec Normal hepatofugal flow is noted in the hepatic veins. IVC: Present and patent with normal respiratory phasicity. Hepatic Artery Velocity:  53 cm/sec Splenic Vein Velocity:  21 cm/sec Spleen: 8.3 cm x 8.5 cm x 4.9 cm with a total volume of 182 cm^3 (411 cm^3 is upper limit normal) Portal Vein Occlusion/Thrombus: No Splenic Vein Occlusion/Thrombus: No Ascites: None Varices: None IMPRESSION: No evidence of hepatic, portal or splenic venous thrombosis or occlusion. Electronically Signed   By: Lupita Raider M.D.   On: 02/08/2020 15:23   US Abdomen Limited RUQ  Result Date: 02/08/2020 CLINICAL DATA:  Right upper quadrant and epigastric pain. EXAM: ULTRASOUND ABDOMEN LIMITED RIGHT UPPER QUADRANT COMPARISON:  Right upper quadrant ultrasound, 01/29/2020. FINDINGS: Gallbladder: Echogenic material layers dependently in the gallbladder consistent with small stones and sludge, unchanged  from the prior study. No wall thickening. No pericholecystic fluid. Common bile duct: Diameter: 3-4 mm. Liver: No focal lesion identified. Within normal limits in parenchymal echogenicity. Portal vein is patent on color Doppler imaging with normal direction of blood flow towards  the liver. Other: None. IMPRESSION: 1. No acute findings. 2. Layering echogenic material in the gallbladder consistent with small stones and sludge. No acute cholecystitis. 3. No change from the recent prior right upper quadrant ultrasound. Electronically Signed   By: Amie Portland M.D.   On: 02/08/2020 11:29    Impression/Recommendations Principal Problem:   Hematemesis Active Problems:   Hyperemesis   Hypokalemia   Tachypnea   Abnormal LFTs   Obesity   Pregnancy-16 weeks   Pleuritic chest pain  Pleuritic chest pain, shortness of breath, tachypnea: Etiology is not clear.  CT angiogram is negative for PE.  Lungs clear to auscultation.  I consulted ICU, Dr. Belia Heman. Per Dr. Belia Heman, pt likely has costochondritis. Will treated with pain medication and steroid per Dr. Belia Heman - Morphine 1-2 mg every 4 hrs as needed - Percocet 325/5 every 4 hrs as needed - PREDNISONE 10 mg daily for 7 days -will get EKG and trop x3  Hematemesis: Dr. Tobi Bastos from GI is on board. Per Dr. Tobi Bastos, pt may have Mallory-Weiss tear.  Hgb 10.5 today. -pt is on IV protonix and Carafate -f/u CBC   Abnormal LFTs: US showed layering echogenic material in the gallbladder consistent with small stones and sludge. No acute cholecystitis. -Follow-up with Dr. Johnney Killian recommendations, f/u hepatitis panel -Avoid using Tylenol  Hyperemesis: -prn Zofran, Phenergan, vitamin B6 -IVF: NS 75 cc/h  Hypokalemia: K= 2.9  on admission. - Repleted - Check Mg level -->2.2  Morbid obesity:  BMI 43.22 -Diet and exercise.   -Encouraged to lose weight.   Pregnancy-16 weeks: Seems no complications currently. -Management per primary team    Thank you for this  consultation.  Our [facility] hospitalist team will follow the patient with you.   Time Spent:  30 min  Lorretta Harp M.D. [facility] 02/09/2020, 3:50 PM

## 2020-02-09 NOTE — Consult Note (Signed)
Patient ID: Joanne Pacheco, female   DOB: 08/03/2001, 19 y.o.   MRN: [id number]  Chief Complaint: Right upper quadrant, epigastric/substernal pain.  History of Present Illness Joanne Pacheco is a 19 y.o. female with the above, currently tachypneic with remarkable avoidance of taking anything more than a shallow breath.  [redacted] weeks pregnant, in her second trimester, she has dealt with hyperemesis, and presented with some mild/minimal hematemesis likely related to Mallory-Weiss.  She has undergone 2 right upper quadrant ultrasounds revealing layering sludge without evidence of acute cholecystitis.  She now has mild elevation of her transaminases with otherwise normal LFTs.  She apparently just had a meal of what appears to be a cheese omelette and yogurt.  She is not entirely aware of what foods may make her symptoms worse.  She has no known history of jaundice or hepatitis.  She has had a hospital work-up remarkable to rule out pulmonary embolism with assessment of her ABGs.  I appreciate pulmonary's consultation.    Past Medical History Past Medical History:  Diagnosis Date  . Hyperemesis 02/08/2020  . Obesity       History reviewed. No pertinent surgical history.  No Known Allergies  Current Facility-Administered Medications  Medication Dose Route Frequency Provider Last Rate Last Admin  . 0.9 %  sodium chloride infusion   Intravenous Continuous Lorretta Harp, MD      . acetaminophen (TYLENOL) tablet 650 mg  650 mg Oral Q4H PRN Tresea Mall, CNM      . docusate sodium (COLACE) capsule 100 mg  100 mg Oral BID Schuman, Christanna R, MD   100 mg at 02/09/20 1034  . doxylamine (Sleep) (UNISOM) tablet 12.5 mg  12.5 mg Oral BID Schuman, Christanna R, MD   12.5 mg at 02/09/20 1034   And  . pyridOXINE (VITAMIN B-6) tablet 25 mg  25 mg Oral BID Schuman, Christanna R, MD   25 mg at 02/09/20 1033  . lactated ringers infusion   Intravenous Continuous Natale Milch, MD   Stopped at 02/09/20 1508   . morphine 2 MG/ML injection 1-2 mg  1-2 mg Intravenous Q4H PRN Erin Fulling, MD      . ondansetron (ZOFRAN) tablet 4 mg  4 mg Oral Q8H PRN Schuman, Christanna R, MD   4 mg at 02/09/20 1034  . oxyCODONE-acetaminophen (PERCOCET/ROXICET) 5-325 MG per tablet 1 tablet  1 tablet Oral Q6H PRN Erin Fulling, MD      . pantoprazole (PROTONIX) injection 40 mg  40 mg Intravenous Q12H Schuman, Christanna R, MD   40 mg at 02/09/20 1221  . potassium chloride 10 mEq in 100 mL IVPB  10 mEq Intravenous Q1 Hr x 4 Schuman, Christanna R, MD 100 mL/hr at 02/09/20 1508 10 mEq at 02/09/20 1508  . [START ON 02/10/2020] predniSONE (DELTASONE) tablet 10 mg  10 mg Oral Q breakfast Kasa, Wallis Bamberg, MD      . prenatal multivitamin tablet 1 tablet  1 tablet Oral Q1200 Schuman, Christanna R, MD   1 tablet at 02/09/20 1221  . promethazine (PHENERGAN) injection 12.5 mg  12.5 mg Intravenous Q6H Schuman, Christanna R, MD   12.5 mg at 02/09/20 1354  . sucralfate (CARAFATE) 1 GM/10ML suspension 1 g  1 g Oral TID WC & HS Schuman, Christanna R, MD   1 g at 02/09/20 1221    Family History No family history on file.    Social History Social History   Tobacco Use  . Smoking status: Never Smoker  .  Smokeless tobacco: Never Used  Substance Use Topics  . Alcohol use: Never  . Drug use: Never        Review of Systems  Constitutional: Positive for malaise/fatigue. Negative for chills, fever and weight loss.  HENT: Negative for hearing loss, sinus pain and sore throat.   Eyes: Negative.   Respiratory: Negative for cough, hemoptysis and wheezing.   Cardiovascular: Positive for chest pain.  Gastrointestinal: Positive for abdominal pain, nausea and vomiting. Negative for blood in stool and melena.  Genitourinary: Negative for dysuria, frequency, hematuria and urgency.  Musculoskeletal: Negative for joint pain.  Skin: Negative.   Neurological: Negative.   Endo/Heme/Allergies: Negative.   Psychiatric/Behavioral: Negative for  depression and suicidal ideas. The patient is not nervous/anxious.       Physical Exam Blood pressure 114/76, pulse 85, temperature 98.4 F (36.9 C), temperature source Oral, resp. rate (!) 42, height 5\' 6"  (1.676 m), weight 121.5 kg, last menstrual period 10/17/2019, SpO2 98 %. Last Weight  Most recent update: 02/09/2020  9:32 AM   Weight  121.5 kg (267 lb 12.8 oz)            CONSTITUTIONAL: Well developed, and nourished, appropriately responsive and aware without distress.   EYES: Sclera non-icteric.   EARS, NOSE, MOUTH AND THROAT: Mask worn, off of face.   The oropharynx is clear. Oral mucosa is pink and moist.   Hearing is intact to voice.  NECK: Trachea is midline, and there is no jugular venous distension.  LYMPH NODES:  Lymph nodes in the neck are not enlarged. RESPIRATORY:  Lungs are clear, and breath sounds are equal bilaterally. Tachypnea with minimal shallow respiratory effort without pathologic use of accessory muscles.  No wheezes, no rales appreciated. CARDIOVASCULAR: Heart is regular in rate and rhythm. GI: The abdomen is  soft, with focal RUQ tenderness, and nondistended. There were no palpable masses. I did not appreciate hepatosplenomegaly.  She won't begin to attempt a deep inspiration.  MUSCULOSKELETAL:  Symmetrical muscle tone appreciated in all four extremities.    SKIN: Skin turgor is normal. No pathologic skin lesions appreciated.  NEUROLOGIC:  Motor and sensation appear grossly normal.  Cranial nerves are grossly without defect. PSYCH:  Alert and oriented to person, place and time. Affect is appropriate for situation.  Data Reviewed I have personally reviewed what is currently available of the patient's imaging, recent labs and medical records.   Labs:  CBC Latest Ref Rng & Units 02/09/2020 02/08/2020 02/08/2020  WBC 4.0 - 10.5 K/uL 8.4 10.1 10.5  Hemoglobin 12.0 - 15.0 g/dL 10.5(L) 11.2(L) 12.5  Hematocrit 36.0 - 46.0 % 30.3(L) 32.4(L) 35.6(L)  Platelets 150 -  400 K/uL 336 321 393   CMP Latest Ref Rng & Units 02/09/2020 02/09/2020 02/08/2020  Glucose 70 - 99 mg/dL 89 - 88  BUN 6 - 20 mg/dL 02/10/2020) - 6  Creatinine <3(X - 1.00 mg/dL 1.06 - 2.69  Sodium 4.85 - 145 mmol/L 137 138 137  Potassium 3.5 - 5.1 mmol/L 3.4(L) 2.9(L) 2.9(L)  Chloride 98 - 111 mmol/L 106 106 103  CO2 22 - 32 mmol/L 23 23 23   Calcium 8.9 - 10.3 mg/dL 9.2 - 9.2  Total Protein 6.5 - 8.1 g/dL 7.3 - 7.5  Total Bilirubin 0.3 - 1.2 mg/dL 0.8 - 1.1  Alkaline Phos 38 - 126 U/L 66 - 66  AST 15 - 41 U/L 125(H) 115(H) 104(H)  ALT 0 - 44 U/L 164(H) 147(H) 145(H)      Imaging:  Radiology review:   CLINICAL DATA:  Right upper quadrant and epigastric pain.  EXAM: ULTRASOUND ABDOMEN LIMITED RIGHT UPPER QUADRANT  COMPARISON:  Right upper quadrant ultrasound, 01/29/2020.  FINDINGS: Gallbladder:  Echogenic material layers dependently in the gallbladder consistent with small stones and sludge, unchanged from the prior study. No wall thickening. No pericholecystic fluid.  Common bile duct:  Diameter: 3-4 mm.  Liver:  No focal lesion identified. Within normal limits in parenchymal echogenicity. Portal vein is patent on color Doppler imaging with normal direction of blood flow towards the liver.  Other: None.  IMPRESSION: 1. No acute findings. 2. Layering echogenic material in the gallbladder consistent with small stones and sludge. No acute cholecystitis. 3. No change from the recent prior right upper quadrant ultrasound.   Electronically Signed   By: Lajean Manes M.D.   On: 02/08/2020 11:29 Within last 24 hrs: US OB Comp + 14 Wk  Result Date: 02/09/2020 CLINICAL DATA:  Fetal dating. EXAM: OBSTETRICAL ULTRASOUND >14 WKS AND TRANSVAGINAL OB US FINDINGS: Number of Fetuses: 1 Heart Rate: 147 bpm Movement: Present Presentation: Breech Previa: No Placental Location: Anterior. There is a question of marginal or velamentous cord insertion again noted. Amniotic Fluid  (Subjective): Normal Amniotic Fluid (Objective): Vertical pocket 2.6 cmcm FETAL BIOMETRY BPD:  3.0cm 15w 3d HC:    11.3cm 15w 3d AC:    9.9cm 16w 0d FL:    1.6cm 14w 5d Current Mean GA: 15w 2d [place] EDC: 07/31/2020 Assigned GA: 16w 3d Assigned EDC: 07/23/2020 FETAL ANATOMY Lateral Ventricles: Not visualized Thalami/CSP: Appears normal Posterior Fossa: Not visualized Nuchal Region: Not visualized Upper Lip: Not visualized Spine: Not visualized 4 Chamber Heart on Left: Not visualized LVOT: Not visualized RVOT: Not visualized Stomach on Left: Appears normal 3 Vessel Cord: Appears normal Cord Insertion site: Not visualized Kidneys: Not visualized Bladder: Appears normal Extremities: Appears normal Technical Limitations: Limited exam due to early gestational age and maternal body habitus. Maternal Findings: Cervix:  3.5 cm and closed. IMPRESSION: 1. Single viable intrauterine pregnancy at 15 weeks 2 days. Breech presentation. 2. Questionable marginal versus velamentous cord insertion again noted. Continued follow-up exam suggested. Electronically Signed   By: Marcello Moores  Register   On: 02/09/2020 10:22    Assessment    Cholecystitis, with likely biliary colic in the midst of second trimester pregnancy. Patient Active Problem List   Diagnosis Date Noted  . Hematemesis 02/09/2020  . Hypokalemia 02/09/2020  . Tachypnea 02/09/2020  . Abnormal LFTs 02/09/2020  . Pregnancy-16 weeks 02/09/2020  . Pleuritic chest pain 02/09/2020  . Obesity   . Hyperemesis 02/08/2020    Plan    We made it very clear that her symptoms may not all be attributable to the gallbladder, but if any were and would help her with getting through the rest of her pregnancy we feel the risk to proceed with robotic cholecystectomy may be worthwhile. We reviewed the details and risks of gallbladder surgery at length: The risks, benefits, complications, treatment options, and expected outcomes were discussed with the patient. The possibilities of  bleeding, recurrent infection, finding a normal gallbladder, damage to surrounding structures, bile leak, the possible need to convert to an open procedure, and creating a complication requiring transfusion or operation were discussed with the patient.  We believe the largest risk is for her baby and in consideration of this feel this may be the best time during her pregnancy to proceed.  The patient concurred with the proposed plan, giving informed consent.  Face-to-face time spent with the patient and accompanying care providers(if present) was 45 minutes, with more than 50% of the time spent counseling, educating, and coordinating care of the patient.      Campbell Lerner M.D., FACS 02/09/2020, 3:50 PM

## 2020-02-10 ENCOUNTER — Encounter
Admission: EM | Disposition: A | Discharge status: Home / Self Care | Payer: Self-pay | Source: Home / Self Care | Attending: Obstetrics and Gynecology

## 2020-02-10 DIAGNOSIS — K920 Hematemesis: Secondary | ICD-10-CM

## 2020-02-10 DIAGNOSIS — R0781 Pleurodynia: Secondary | ICD-10-CM

## 2020-02-10 DIAGNOSIS — R1013 Epigastric pain: Secondary | ICD-10-CM

## 2020-02-10 DIAGNOSIS — R7401 Elevation of levels of liver transaminase levels: Secondary | ICD-10-CM

## 2020-02-10 LAB — CBC
HCT: 32.8 % — ABNORMAL LOW (ref 36.0–46.0)
Hemoglobin: 11.3 g/dL — ABNORMAL LOW (ref 12.0–15.0)
MCH: 30.2 pg (ref 26.0–34.0)
MCHC: 34.5 g/dL (ref 30.0–36.0)
MCV: 87.7 fL (ref 80.0–100.0)
Platelets: 341 10*3/uL (ref 150–400)
RBC: 3.74 MIL/uL — ABNORMAL LOW (ref 3.87–5.11)
RDW: 13.4 % (ref 11.5–15.5)
WBC: 8.7 10*3/uL (ref 4.0–10.5)
nRBC: 0 % (ref 0.0–0.2)

## 2020-02-10 LAB — COMPREHENSIVE METABOLIC PANEL
ALT: 152 U/L — ABNORMAL HIGH (ref 0–44)
AST: 113 U/L — ABNORMAL HIGH (ref 15–41)
Albumin: 3.2 g/dL — ABNORMAL LOW (ref 3.5–5.0)
Alkaline Phosphatase: 64 U/L (ref 38–126)
Anion gap: 11 (ref 5–15)
BUN: <5 mg/dL — ABNORMAL LOW (ref 6–20)
CO2: 21 mmol/L — ABNORMAL LOW (ref 22–32)
Calcium: 9.1 mg/dL (ref 8.9–10.3)
Chloride: 104 mmol/L (ref 98–111)
Creatinine, Ser: 0.46 mg/dL (ref 0.44–1.00)
GFR calc Af Amer: >60 mL/min (ref >60)
GFR calc non Af Amer: >60 mL/min (ref >60)
Glucose, Bld: 80 mg/dL (ref 70–99)
Potassium: 3.1 mmol/L — ABNORMAL LOW (ref 3.5–5.1)
Sodium: 136 mmol/L (ref 135–145)
Total Bilirubin: 0.6 mg/dL (ref 0.3–1.2)
Total Protein: 6.9 g/dL (ref 6.5–8.1)

## 2020-02-10 LAB — VARICELLA-ZOSTER BY PCR: Varicella-Zoster, PCR: NEGATIVE

## 2020-02-10 LAB — HEMOGLOBIN A1C
Hgb A1c MFr Bld: 5.2 % (ref 4.8–5.6)
Mean Plasma Glucose: 102.54 mg/dL

## 2020-02-10 LAB — RPR: RPR Ser Ql: NONREACTIVE

## 2020-02-10 SURGERY — CHOLECYSTECTOMY, ROBOT-ASSISTED, LAPAROSCOPIC
Anesthesia: General

## 2020-02-10 MED ORDER — OXYCODONE HCL 5 MG PO TABS
5.0000 mg | ORAL_TABLET | Freq: Four times a day (QID) | ORAL | Status: DC | PRN
  Administered 2020-02-10 – 2020-02-11 (×2): 5 mg via ORAL
  Filled 2020-02-10: qty 1

## 2020-02-10 MED ORDER — POTASSIUM CHLORIDE CRYS ER 20 MEQ PO TBCR
40.0000 meq | EXTENDED_RELEASE_TABLET | Freq: Two times a day (BID) | ORAL | Status: AC
  Administered 2020-02-10 (×2): 40 meq via ORAL
  Filled 2020-02-10 (×2): qty 2

## 2020-02-10 MED ORDER — SODIUM CHLORIDE (PF) 0.9 % IJ SOLN
INTRAMUSCULAR | Status: AC
  Filled 2020-02-10: qty 10

## 2020-02-10 NOTE — Progress Notes (Signed)
Dr. Tiburcio Pea called to clarify IVF orders. LR D/C'd and NS to be continued as ordered. Order to start PO Oxycodone received due to Pt. Pain scale rating of '6" with movement. Reported to Dr. Tiburcio Pea continues Tachypnea of 27-32 with Pulse Ox greater then 95%. Also, informed Dr. Tiburcio Pea of Pt.'s poor use Of I.S. : Goal 1750 and she is only able to consistently reach 1200.

## 2020-02-10 NOTE — Progress Notes (Signed)
Arlyss Repress, MD [address]  [address]  [place], [place] [address]  Main: [phone]  Fax: [phone] Pager: [phone]   Subjective: Patient reports that her symptoms are better today.  She denies nausea or vomiting.  She is tolerating regular diet.  Cholecystectomy has been postponed due to tachypnea and chest pain.   Objective: Vital signs in last 24 hours: Vitals:   02/10/20 0317 02/10/20 0645 02/10/20 0733 02/10/20 1159  BP: 111/66  124/63 124/69  Pulse: 84  88 98  Resp: (!) 40  (!) 36 (!) 36  Temp: 98.7 F (37.1 C)  98.7 F (37.1 C) 98.2 F (36.8 C)  TempSrc: Oral  Oral Oral  SpO2: 99%  98% 98%  Weight:  122.2 kg    Height:       Weight change: -8.255 kg  Intake/Output Summary (Last 24 hours) at 02/10/2020 1542 Last data filed at 02/09/2020 1902 Gross per 24 hour  Intake 240 ml  Output --  Net 240 ml     Exam: Heart:: Regular rate and rhythm, S1S2 present or without murmur or extra heart sounds Lungs: Tachypnea Abdomen: soft, nontender, normal bowel sounds   Lab Results: CBC Latest Ref Rng & Units 02/10/2020 02/09/2020 02/08/2020  WBC 4.0 - 10.5 K/uL 8.7 8.4 10.1  Hemoglobin 12.0 - 15.0 g/dL 11.3(L) 10.5(L) 11.2(L)  Hematocrit 36.0 - 46.0 % 32.8(L) 30.3(L) 32.4(L)  Platelets 150 - 400 K/uL 341 336 321   CMP Latest Ref Rng & Units 02/10/2020 02/09/2020 02/09/2020  Glucose 70 - 99 mg/dL 80 89 -  BUN 6 - 20 mg/dL <7(S) <1(S) -  Creatinine 0.44 - 1.00 mg/dL 2.39 5.32 -  Sodium 023 - 145 mmol/L 136 137 138  Potassium 3.5 - 5.1 mmol/L 3.1(L) 3.4(L) 2.9(L)  Chloride 98 - 111 mmol/L 104 106 106  CO2 22 - 32 mmol/L 21(L) 23 23  Calcium 8.9 - 10.3 mg/dL 9.1 9.2 -  Total Protein 6.5 - 8.1 g/dL 6.9 7.3 -  Total Bilirubin 0.3 - 1.2 mg/dL 0.6 0.8 -  Alkaline Phos 38 - 126 U/L 64 66 -  AST 15 - 41 U/L 113(H) 125(H) 115(H)  ALT 0 - 44 U/L 152(H) 164(H) 147(H)    Micro Results: Recent Results (from the past 240 hour(s))  Respiratory Panel by RT  PCR (Flu A&B, Covid) - Nasopharyngeal Swab     Status: None   Collection Time: 02/08/20 11:14 AM   Specimen: Nasopharyngeal Swab  Result Value Ref Range Status   SARS Coronavirus 2 by RT PCR NEGATIVE NEGATIVE Final    Comment: (NOTE) SARS-CoV-2 target nucleic acids are NOT DETECTED. The SARS-CoV-2 RNA is generally detectable in upper respiratoy specimens during the acute phase of infection. The lowest concentration of SARS-CoV-2 viral copies this assay can detect is 131 copies/mL. A negative result does not preclude SARS-Cov-2 infection and should not be used as the sole basis for treatment or other patient management decisions. A negative result may occur with  improper specimen collection/handling, submission of specimen other than nasopharyngeal swab, presence of viral mutation(s) within the areas targeted by this assay, and inadequate number of viral copies (<131 copies/mL). A negative result must be combined with clinical observations, patient history, and epidemiological information. The expected result is Negative. Fact Sheet for Patients:  https://www.moore.com/ Fact Sheet for Healthcare Providers:  https://www.young.biz/ This test is not yet ap proved or cleared by the [place] FDA and  has been authorized for detection and/or diagnosis  of SARS-CoV-2 by FDA under an Emergency Use Authorization (EUA). This EUA will remain  in effect (meaning this test can be used) for the duration of the COVID-19 declaration under Section 564(b)(1) of the Act, 21 U.S.C. section 360bbb-3(b)(1), unless the authorization is terminated or revoked sooner.    Influenza A by PCR NEGATIVE NEGATIVE Final   Influenza B by PCR NEGATIVE NEGATIVE Final    Comment: (NOTE) The Xpert Xpress SARS-CoV-2/FLU/RSV assay is intended as an aid in  the diagnosis of influenza from Nasopharyngeal swab specimens and  should not be used as a sole basis for treatment. Nasal  washings and  aspirates are unacceptable for Xpert Xpress SARS-CoV-2/FLU/RSV  testing. Fact Sheet for Patients: PinkCheek.be Fact Sheet for Healthcare Providers: GravelBags.it This test is not yet approved or cleared by the [place] FDA and  has been authorized for detection and/or diagnosis of SARS-CoV-2 by  FDA under an Emergency Use Authorization (EUA). This EUA will remain  in effect (meaning this test can be used) for the duration of the  Covid-19 declaration under Section 564(b)(1) of the Act, 21  U.S.C. section 360bbb-3(b)(1), unless the authorization is  terminated or revoked. Performed at [facility], [address] [place], [place] [address]    Studies/Results: [place] Comp + 14 Wk  Result Date: 02/09/2020 CLINICAL DATA:  Fetal dating. EXAM: OBSTETRICAL ULTRASOUND >14 WKS AND TRANSVAGINAL OB US FINDINGS: Number of Fetuses: 1 Heart Rate: 147 bpm Movement: Present Presentation: Breech Previa: No Placental Location: Anterior. There is a question of marginal or velamentous cord insertion again noted. Amniotic Fluid (Subjective): Normal Amniotic Fluid (Objective): Vertical pocket 2.6 cmcm FETAL BIOMETRY BPD:  3.0cm 15w 3d HC:    11.3cm 15w 3d AC:    9.9cm 16w 0d FL:    1.6cm 14w 5d Current Mean GA: 15w 2d [place] EDC: 07/31/2020 Assigned GA: 16w 3d Assigned EDC: 07/23/2020 FETAL ANATOMY Lateral Ventricles: Not visualized Thalami/CSP: Appears normal Posterior Fossa: Not visualized Nuchal Region: Not visualized Upper Lip: Not visualized Spine: Not visualized 4 Chamber Heart on Left: Not visualized LVOT: Not visualized RVOT: Not visualized Stomach on Left: Appears normal 3 Vessel Cord: Appears normal Cord Insertion site: Not visualized Kidneys: Not visualized Bladder: Appears normal Extremities: Appears normal Technical Limitations: Limited exam due to early gestational age and maternal body habitus. Maternal Findings: Cervix:   3.5 cm and closed. IMPRESSION: 1. Single viable intrauterine pregnancy at 15 weeks 2 days. Breech presentation. 2. Questionable marginal versus velamentous cord insertion again noted. Continued follow-up exam suggested. Electronically Signed   By: Marcello Moores  Register   On: 02/09/2020 10:22   Medications:  I have reviewed the patient's current medications. Prior to Admission:  Medications Prior to Admission  Medication Sig Dispense Refill Last Dose   ondansetron (ZOFRAN) 4 MG tablet Take 1 tablet (4 mg total) by mouth daily as needed. 14 tablet 0 Past Week at PRN   Doxylamine-Pyridoxine (DICLEGIS) 10-10 MG TBEC Take two tablets at bedtime on day 1 and 2; if symptoms persist, take 1 tab in AM and 2 tabs at bedtime day 3, then 1 tab every 6 hr PRN (Patient not taking: Reported on 02/08/2020) 30 tablet 0 Not Taking at Unknown time   pantoprazole (PROTONIX) 40 MG tablet Take 1 tablet (40 mg total) by mouth daily for 7 days. 7 tablet 0    potassium chloride (KLOR-CON) 10 MEQ tablet Take 2 tablets (20 mEq total) by mouth daily for 5 days. 10 tablet 0  Prenatal Vit-Fe Fumarate-FA (PRENATAL MULTIVITAMIN) TABS tablet Take 1 tablet by mouth daily at 12 noon.   Unknown at Unknown time   Scheduled:  docusate sodium  100 mg Oral BID   doxylamine (Sleep)  12.5 mg Oral BID   And   vitamin B-6  25 mg Oral BID   indocyanine green  1.25 mg Intravenous Once   pantoprazole (PROTONIX) IV  40 mg Intravenous Q12H   potassium chloride  40 mEq Oral BID   predniSONE  10 mg Oral Q breakfast   prenatal multivitamin  1 tablet Oral Q1200   promethazine  12.5 mg Intravenous Q6H   sodium chloride (PF)       sucralfate  1 g Oral TID WC & HS   Continuous:  sodium chloride 75 mL/hr at 02/10/20 0644    ceFAZolin (ANCEF) IV     lactated ringers Stopped (02/09/20 1508)   [id number] injection, ondansetron, oxyCODONE Anti-infectives (From admission, onward)   Start     Dose/Rate Route Frequency Ordered  Stop   02/10/20 0600  ceFAZolin (ANCEF) powder 3 g  Status:  Discontinued     3 g Other To Surgery 02/09/20 1612 02/09/20 1915   02/10/20 0600  ceFAZolin (ANCEF) 3 g in dextrose 5 % 50 mL IVPB     3 g 100 mL/hr over 30 Minutes Intravenous To Surgery 02/09/20 1917 02/11/20 0600     Scheduled Meds:  docusate sodium  100 mg Oral BID   doxylamine (Sleep)  12.5 mg Oral BID   And   vitamin B-6  25 mg Oral BID   indocyanine green  1.25 mg Intravenous Once   pantoprazole (PROTONIX) IV  40 mg Intravenous Q12H   potassium chloride  40 mEq Oral BID   predniSONE  10 mg Oral Q breakfast   prenatal multivitamin  1 tablet Oral Q1200   promethazine  12.5 mg Intravenous Q6H   sodium chloride (PF)       sucralfate  1 g Oral TID WC & HS   Continuous Infusions:  sodium chloride 75 mL/hr at 02/10/20 0644    ceFAZolin (ANCEF) IV     lactated ringers Stopped (02/09/20 1508)   PRN Meds:.morphine injection, ondansetron, oxyCODONE   Assessment: Principal Problem:   Hematemesis Active Problems:   Hyperemesis   Hypokalemia   Tachypnea   Abnormal LFTs   Obesity   Pregnancy-16 weeks   Pleuritic chest pain   Epigastric pain   Plan: Hematemesis: Resolved, likely secondary to Mallory-Weiss tear Hemoglobin is stable Continue Protonix 40 mg twice daily and switch to Pepcid as outpatient  Elevated LFTs likely secondary to biliary colic No evidence of biliary obstruction Viral hepatitis panel negative Surgery is following  GI will sign off at this time, please call us back with questions or concerns   LOS: 2 days   Eann Cleland 02/10/2020, 3:42 PM

## 2020-02-10 NOTE — Progress Notes (Signed)
Daily Progress Note Joanne Pacheco  [id number]  HD#2 Subjective:  Overnight Events: no acute events  Complaints: still working to breath. It is hard for her to move air into her lungs.   She denies: fevers, chills, chest pain, nausea, vomiting, severe abdominal pain.  She has tolerated: some soft food and liquid diet without emesis since yesterday She is ambulating and is voiding. She becomes more short of breath with exertion. She has had consultations today by the hospitalist, general surgery, and pulmonology.   Objective:  Most recent vitals Temp: 98.7 F (37.1 C)  BP: 124/63  Pulse Rate: 88  Resp: (!) 36  SpO2: 98 %   Vitals Range over 24 hours Temp  Avg: 98.7 F (37.1 C)  Min: 98.4 F (36.9 C)  Max: 99 F (37.2 C) BP  Min: 102/67  Max: 124/63 Pulse  Avg: 88.5  Min: 84  Max: 95 SpO2  Avg: 98.5 %  Min: 98 %  Max: 99 %   Physical Exam Physical Exam  Constitutional: She is oriented to person, place, and time and well-developed, well-nourished, and in no distress. No distress.  HENT:  Head: Normocephalic and atraumatic.  Eyes: Conjunctivae are normal. No scleral icterus.  Cardiovascular: Normal rate and regular rhythm. Exam reveals no gallop and no friction rub.  No murmur heard. Pulmonary/Chest: She has no wheezes. She has no rales. She exhibits tenderness (mid-sternum).  Tachypnea, no obvious accessory muscle use  Abdominal: Soft. Bowel sounds are normal. She exhibits no distension. There is no abdominal tenderness. There is no rebound and no guarding.  Genitourinary:    Genitourinary Comments: deferred   Musculoskeletal:        General: No edema. Normal range of motion.  Neurological: She is alert and oriented to person, place, and time. No cranial nerve deficit.  Skin: Skin is warm. She is diaphoretic (mildly, noted on forehead). No erythema.  Psychiatric: Mood, affect and judgment normal.     AM Labs Lab Results  Component Value Date   WBC 8.7 02/10/2020   HGB  11.3 (L) 02/10/2020   HCT 32.8 (L) 02/10/2020   PLT 341 02/10/2020   NA 137 02/09/2020   K 3.4 (L) 02/09/2020   CREATININE 0.47 02/09/2020   BUN <5 (L) 02/09/2020   INR 1.0 02/08/2020   CT Angio Chest PE W and/or Wo Contrast  Result Date: 02/08/2020 CLINICAL DATA:  Tachypnea and short of breath following emesis. Cough. [redacted] weeks pregnant EXAM: CT ANGIOGRAPHY CHEST WITH CONTRAST TECHNIQUE: Multidetector CT imaging of the chest was performed using the standard protocol during bolus administration of intravenous contrast. Multiplanar CT image reconstructions and MIPs were obtained to evaluate the vascular anatomy. CONTRAST:  52mL OMNIPAQUE IOHEXOL 350 MG/ML SOLN COMPARISON:  Chest 02/08/2020 FINDINGS: Cardiovascular: Satisfactory opacification of the pulmonary arteries to the segmental level. No evidence of pulmonary embolism. Normal heart size. No pericardial effusion. Normal thoracic aorta. Heart size normal. Mediastinum/Nodes: Negative for mass or adenopathy. Lungs/Pleura: Lungs well aerated and clear. No infiltrate effusion or mass. Upper Abdomen: No acute abnormality. Musculoskeletal: Negative Review of the MIP images confirms the above findings. IMPRESSION: Negative CTA chest. Electronically Signed   By: Marlan Palau M.D.   On: 02/08/2020 13:34   US OB Comp + 14 Wk  Result Date: 02/09/2020 CLINICAL DATA:  Fetal dating. EXAM: OBSTETRICAL ULTRASOUND >14 WKS AND TRANSVAGINAL OB US FINDINGS: Number of Fetuses: 1 Heart Rate: 147 bpm Movement: Present Presentation: Breech Previa: No Placental Location: Anterior. There is a  question of marginal or velamentous cord insertion again noted. Amniotic Fluid (Subjective): Normal Amniotic Fluid (Objective): Vertical pocket 2.6 cmcm FETAL BIOMETRY BPD:  3.0cm 15w 3d HC:    11.3cm 15w 3d AC:    9.9cm 16w 0d FL:    1.6cm 14w 5d Current Mean GA: 15w 2d [place] EDC: 07/31/2020 Assigned GA: 16w 3d Assigned EDC: 07/23/2020 FETAL ANATOMY Lateral Ventricles: Not visualized  Thalami/CSP: Appears normal Posterior Fossa: Not visualized Nuchal Region: Not visualized Upper Lip: Not visualized Spine: Not visualized 4 Chamber Heart on Left: Not visualized LVOT: Not visualized RVOT: Not visualized Stomach on Left: Appears normal 3 Vessel Cord: Appears normal Cord Insertion site: Not visualized Kidneys: Not visualized Bladder: Appears normal Extremities: Appears normal Technical Limitations: Limited exam due to early gestational age and maternal body habitus. Maternal Findings: Cervix:  3.5 cm and closed. IMPRESSION: 1. Single viable intrauterine pregnancy at 15 weeks 2 days. Breech presentation. 2. Questionable marginal versus velamentous cord insertion again noted. Continued follow-up exam suggested. Electronically Signed   By: Maisie Fus  Register   On: 02/09/2020 10:22   DG Chest Portable 1 View  Result Date: 02/08/2020 CLINICAL DATA:  Chest pain. Epigastric pain and hematemesis. Sixteen weeks pregnant. EXAM: PORTABLE CHEST 1 VIEW COMPARISON:  01/29/2020 FINDINGS: The cardiac silhouette is accentuated by hypoinflation. Lung volumes are low with bronchovascular crowding in the perihilar and basilar regions. No confluent airspace opacity, overt pulmonary edema, pleural effusion, pneumothorax is identified. No acute osseous abnormality is seen. IMPRESSION: Low lung volumes with bibasilar atelectasis. Electronically Signed   By: Sebastian Ache M.D.   On: 02/08/2020 11:06   US LIVER DOPPLER  Result Date: 02/08/2020 CLINICAL DATA:  Abnormal liver function tests. EXAM: DUPLEX ULTRASOUND OF LIVER TECHNIQUE: Color and duplex Doppler ultrasound was performed to evaluate the hepatic in-flow and out-flow vessels. COMPARISON:  None. FINDINGS: Liver: Normal parenchymal echogenicity. Normal hepatic contour without nodularity. No focal lesion, mass or intrahepatic biliary ductal dilatation. Main Portal Vein size: 3 cm Portal Vein Velocities Main Prox:  43 cm/sec Main Mid: 33 cm/sec Main Dist:  30 cm/sec  Right: 18 cm/sec Left: 20 cm/sec Normal hepatopetal flow is noted in the portal veins. Hepatic Vein Velocities Right:  18 cm/sec Middle:  23 cm/sec Left:  14 cm/sec Normal hepatofugal flow is noted in the hepatic veins. IVC: Present and patent with normal respiratory phasicity. Hepatic Artery Velocity:  53 cm/sec Splenic Vein Velocity:  21 cm/sec Spleen: 8.3 cm x 8.5 cm x 4.9 cm with a total volume of 182 cm^3 (411 cm^3 is upper limit normal) Portal Vein Occlusion/Thrombus: No Splenic Vein Occlusion/Thrombus: No Ascites: None Varices: None IMPRESSION: No evidence of hepatic, portal or splenic venous thrombosis or occlusion. Electronically Signed   By: Lupita Raider M.D.   On: 02/08/2020 15:23   US Abdomen Limited RUQ  Result Date: 02/08/2020 CLINICAL DATA:  Right upper quadrant and epigastric pain. EXAM: ULTRASOUND ABDOMEN LIMITED RIGHT UPPER QUADRANT COMPARISON:  Right upper quadrant ultrasound, 01/29/2020. FINDINGS: Gallbladder: Echogenic material layers dependently in the gallbladder consistent with small stones and sludge, unchanged from the prior study. No wall thickening. No pericholecystic fluid. Common bile duct: Diameter: 3-4 mm. Liver: No focal lesion identified. Within normal limits in parenchymal echogenicity. Portal vein is patent on color Doppler imaging with normal direction of blood flow towards the liver. Other: None. IMPRESSION: 1. No acute findings. 2. Layering echogenic material in the gallbladder consistent with small stones and sludge. No acute cholecystitis. 3. No change from the  recent prior right upper quadrant ultrasound. Electronically Signed   By: Lajean Manes M.D.   On: 02/08/2020 11:29      Assessment:  Joanne Pacheco is a 19 y.o. pregnant female at [redacted]w[redacted]d HD#2 admitted for hyperemesis gravidarum, hematemesis, elevated liver enzymes, hypokalemia, tachypnea, chest pain/right upper quadrant pain.  Plan:   1) hyperemesis gravidarum: continue with current treatment. Appears  to be improving. 2) hematemesis: Appreciate GI input and recommendations. Defer to GI service.  Patient declines upper endoscopy at this time.  Suspect Mallory-Weis tears. 3) elevated liver enzymes: Defer to GI/general surgery/hospitalist. Could be related to hyperemesis.  Trend these values as her nausea improves. 4) hypokalemia: improved after repletion. Monitor 5) tachypnea: There is still no solid explanation for this.  Possible costochondritis: steroids/pain meds.  Possible hepatobiliary explanation: defer to hospitalist, GI, and general surgery.  I did add supplemental O2 for comfort, as she did voice air hunger, despite normal O2 sats and an essentially normal ABG (mild respiratory alkilemia).   6) Chest pain: defer to hospitalist/pulmonology.  Mildly elevated troponin.  Possible relationship to esophagitis, possible relationship to costochondritis, hepatobiliary issue.  7) Right upper quadrant pain: defer to consultants.  Possible hepatobiliary issue given elevated liver enzymes.  8) General surgery has planned for robotic cholecystectomy tomorrow AM (3/27).  Defer to that service on this.    DVT Prophylaxis:  SCDs Activity: as tolerated Anticipated Discharge: 1-2 days  A total of 45 minutes were spent face-to-face with the patient as well as preparation, review, communication, and documentation during this encounter.    Prentice Docker, MD  02/09/2020 8:31 PM

## 2020-02-10 NOTE — Progress Notes (Signed)
Benign Gynecology Progress Note  Admission Date: 02/08/2020 Current Date: 02/10/2020  Joanne Pacheco is a 19 y.o. G1P0 HD#2 @ [redacted]w[redacted]d by LMP/US.  History complicated by: Patient Active Problem List   Diagnosis Date Noted  . Hematemesis 02/09/2020  . Hypokalemia 02/09/2020  . Tachypnea 02/09/2020  . Abnormal LFTs 02/09/2020  . Pregnancy-16 weeks 02/09/2020  . Pleuritic chest pain 02/09/2020  . Obesity   . Epigastric pain   . Hyperemesis 02/08/2020   ROS and patient/family/surgical history, located on admission H&P note dated 02/08/2020, have been reviewed, and there are no changes except as noted below  Yesterday/Overnight Events:  Improved nausea w no emesis over night    Is NPO currently Abdominal pains mild and generalized all over Improved work of breathing (still frequent respirations)    SOB w exertion Denies fever, chills, CP.  Objective:   Vitals:   02/10/20 0024 02/10/20 0317 02/10/20 0645 02/10/20 0733  BP: 115/69 111/66  124/63  Pulse: 89 84  88  Resp: (!) 42 (!) 40  (!) 36  Temp: 98.8 F (37.1 C) 98.7 F (37.1 C)  98.7 F (37.1 C)  TempSrc: Oral Oral  Oral  SpO2: 99% 99%  98%  Weight:   122.2 kg   Height:        Intake/Output Summary (Last 24 hours) at 02/10/2020 0942 Last data filed at 02/09/2020 1902 Gross per 24 hour  Intake 932.57 ml  Output 400 ml  Net 532.57 ml   Physical exam: General appearance: alert, cooperative and appears stated age Abdomen: soft, mildly T all quadrants, ND;  FHT 150s GU: No gross VB Lungs: clear to auscultation bilaterally Heart: regular rate and rhythm and no MRGs Extremities: no redness or tenderness in the calves or thighs, no edema Skin: no lesions Psych: appropriate  Recent Labs  Lab 02/08/20 1346 02/08/20 1346 02/09/20 0532 02/09/20 1504 02/10/20 0857  NA 137   < > 138 137 136  K 2.9*   < > 2.9* 3.4* 3.1*  CL 103   < > 106 106 104  CO2 23   < > 23 23 21*  BUN 6  --   --  <5* <5*  CREATININE 0.46  --    --  0.47 0.46  GLUCOSE 88  --   --  89 80   < > = values in this interval not displayed.   Recent Labs  Lab 02/08/20 1306 02/09/20 0532 02/10/20 0622  WBC 10.1 8.4 8.7  HGB 11.2* 10.5* 11.3*  HCT 32.4* 30.3* 32.8*  PLT 321 336 341    Recent Labs  Lab 02/08/20 0855 02/08/20 1046 02/08/20 1346 02/09/20 1504 02/10/20 0857  CALCIUM   < >  --  9.2 9.2 9.1  MG  --  2.2  --   --   --    < > = values in this interval not displayed.   Recent Labs  Lab 02/08/20 1114  INR 1.0      Recent Labs  Lab 02/08/20 1346 02/08/20 1346 02/09/20 0532 02/09/20 1504 02/10/20 0857  ALKPHOS 66  --   --  66 64  BILITOT 1.1  --   --  0.8 0.6  PROT 7.5  --   --  7.3 6.9  ALT 145*   < > 147* 164* 152*  AST 104*   < > 115* 125* 113*   < > = values in this interval not displayed.    Recent Labs  Lab 02/08/20 1306 02/09/20  0532 02/10/20 0622  WBC 10.1 8.4 8.7  HGB 11.2* 10.5* 11.3*  HCT 32.4* 30.3* 32.8*  PLT 321 336 341   Recent Labs  Lab 02/08/20 1346 02/08/20 1346 02/09/20 0532 02/09/20 1504 02/10/20 0857  NA 137   < > 138 137 136  K 2.9*   < > 2.9* 3.4* 3.1*  CL 103   < > 106 106 104  CO2 23   < > 23 23 21*  BUN 6  --   --  <5* <5*  CREATININE 0.46  --   --  0.47 0.46  CALCIUM 9.2  --   --  9.2 9.1  PROT 7.5  --   --  7.3 6.9  BILITOT 1.1  --   --  0.8 0.6  ALKPHOS 66  --   --  66 64  ALT 145*   < > 147* 164* 152*  AST 104*   < > 115* 125* 113*  GLUCOSE 88  --   --  89 80   < > = values in this interval not displayed.    Assessment & Plan:  1) hyperemesis gravidarum: continue with current treatment. Appears to be improving. Advance diet today as long as not having surgery Cont IVF; prn meds for nausea 2) hematemesis: Appreciate GI input and recommendations. Defer to GI service.  Patient declines upper endoscopy at this time.  Suspect Mallory-Weis tears. 3) elevated liver enzymes: Defer to GI/general surgery/hospitalist. Could be related to hyperemesis.  Trend these  values as her nausea improves. 4) hypokalemia: improved after repletion. Monitor 5) tachypnea: There is still no solid explanation for this.  Possible costochondritis: steroids/pain meds.  Possible hepatobiliary explanation: defer to hospitalist, GI, and general surgery.   6) Chest pain: defer to hospitalist/pulmonology.  Mildly elevated troponin.  Possible relationship to esophagitis, possible relationship to costochondritis, hepatobiliary issue.  7) Right upper quadrant pain: defer to consultants.  Possible hepatobiliary issue given elevated liver enzymes.  8) General surgery had planned for robotic cholecystectomy this AM (3/27) but this has since been deferred.  Defer to that service on this.    Barnett Applebaum, MD, [facility], [facility] 02/10/2020  9:47 AM

## 2020-02-10 NOTE — Progress Notes (Signed)
PROGRESS NOTE    Joanne Pacheco  VOJ:[id number] DOB: 04-20-2001 DOA: 02/08/2020 PCP: [facility]       Assessment & Plan:   Principal Problem:   Hematemesis Active Problems:   Hyperemesis   Hypokalemia   Tachypnea   Abnormal LFTs   Obesity   Pregnancy-16 weeks   Pleuritic chest pain   Epigastric pain   Pleuritic chest pain: w/ shortness of breath, tachypnea. Not requiring supplemental oxygen. Not using accessory muscles. Etiology unclear.  CT angiogram is negative for PE. CXR shows bibasilar atelectasis.  Per Dr. Mortimer Fries, pt likely has costochondritis. Continue on prednisone x 7 days. Morphine prn for pain. Encourage incentive spirometry   Hematemesis: etiology unclear, possible Mallory-Weiss tear as per GI. Continue on IV protonix and Carafate  Transaminitis: LFTs still elevated but trending down. US showed layering echogenic material in the gallbladder consistent with small stones and sludge. No acute cholecystitis. Hepatitis B, C were neg. Previously scheduled for cholecystectomy but hold now as per gen surg. Management per gen surg   Hyperemesis: Zofran, Phenergan prn. Continue on IVFs. Resolving   Hypokalemia: KCl repleted. Will continue to monitor   Morbid obesity:  BMI 43.22. Would benefit from weight loss   Pregnancy-16 weeks: Seems no complications currently. Management per primary OB/GYN team   DVT prophylaxis: encourage ambulation  Code Status: full  Family Communication: discussed w/ pt's family who was at bedside and answered her questions  Disposition Plan: defer to primary OBGYN team  Consultants:   Hospitalist service    Procedures:    Antimicrobials:   Subjective: Pt c/o fatigue   Objective: Vitals:   02/10/20 0024 02/10/20 0317 02/10/20 0645 02/10/20 0733  BP: 115/69 111/66  124/63  Pulse: 89 84  88  Resp: (!) 42 (!) 40  (!) 36  Temp: 98.8 F (37.1 C) 98.7 F (37.1 C)  98.7 F (37.1 C)  TempSrc: Oral  Oral  Oral  SpO2: 99% 99%  98%  Weight:   122.2 kg   Height:        Intake/Output Summary (Last 24 hours) at 02/10/2020 0834 Last data filed at 02/09/2020 1902 Gross per 24 hour  Intake 932.57 ml  Output 400 ml  Net 532.57 ml   Filed Weights   02/08/20 0851 02/09/20 0900 02/10/20 0645  Weight: 129.7 kg 121.5 kg 122.2 kg    Examination:  General exam: Appears calm and comfortable  Respiratory system: diminished breath sounds b/l otherwise clear. Respiratory effort normal. No accessory muscle use Cardiovascular system: S1 & S2 +. No  rubs, gallops or clicks.  Gastrointestinal system: Abdomen is obese, soft and nontender.  Hypoactive bowel sounds heard. Central nervous system: Alert and oriented. Moves all 4 extremities  Psychiatry: Judgement and insight appear normal. Mood & affect appropriate.     Data Reviewed: I have personally reviewed following labs and imaging studies  CBC: Recent Labs  Lab 02/08/20 0855 02/08/20 1306 02/09/20 0532 02/10/20 0622  WBC 10.5 10.1 8.4 8.7  HGB 12.5 11.2* 10.5* 11.3*  HCT 35.6* 32.4* 30.3* 32.8*  MCV 85.2 87.1 87.3 87.7  PLT 393 321 336 993   Basic Metabolic Panel: Recent Labs  Lab 02/08/20 0855 02/08/20 1046 02/08/20 1346 02/09/20 0532 02/09/20 1504  NA 136  --  137 138 137  K 2.6*  --  2.9* 2.9* 3.4*  CL 104  --  103 106 106  CO2 21*  --  23 23 23   GLUCOSE 97  --  88  --  89  BUN 8  --  6  --  <5*  CREATININE 0.40*  --  0.46  --  0.47  CALCIUM 9.4  --  9.2  --  9.2  MG  --  2.2  --   --   --    GFR: Estimated Creatinine Clearance: 150.9 mL/min (by C-G formula based on SCr of 0.47 mg/dL). Liver Function Tests: Recent Labs  Lab 02/08/20 0855 02/08/20 1346 02/09/20 0532 02/09/20 1504  AST 111* 104* 115* 125*  ALT 156* 145* 147* 164*  ALKPHOS 71 66  --  66  BILITOT 1.0 1.1  --  0.8  PROT 8.3* 7.5  --  7.3  ALBUMIN 3.9 3.6  --  3.4*   Recent Labs  Lab 02/08/20 0855  LIPASE 40   No results for input(s):  AMMONIA in the last 168 hours. Coagulation Profile: Recent Labs  Lab 02/08/20 1114  INR 1.0   Cardiac Enzymes: No results for input(s): CKTOTAL, CKMB, CKMBINDEX, TROPONINI in the last 168 hours. BNP (last 3 results) No results for input(s): PROBNP in the last 8760 hours. HbA1C: Recent Labs    02/09/20 2027  HGBA1C 5.2   CBG: No results for input(s): GLUCAP in the last 168 hours. Lipid Profile: No results for input(s): CHOL, HDL, LDLCALC, TRIG, CHOLHDL, LDLDIRECT in the last 72 hours. Thyroid Function Tests: No results for input(s): TSH, T4TOTAL, FREET4, T3FREE, THYROIDAB in the last 72 hours. Anemia Panel: No results for input(s): VITAMINB12, FOLATE, FERRITIN, TIBC, IRON, RETICCTPCT in the last 72 hours. Sepsis Labs: Recent Labs  Lab 02/08/20 1114 02/08/20 1405  LATICACIDVEN 1.4 1.1    Recent Results (from the past 240 hour(s))  Respiratory Panel by RT PCR (Flu A&B, Covid) - Nasopharyngeal Swab     Status: None   Collection Time: 02/08/20 11:14 AM   Specimen: Nasopharyngeal Swab  Result Value Ref Range Status   SARS Coronavirus 2 by RT PCR NEGATIVE NEGATIVE Final    Comment: (NOTE) SARS-CoV-2 target nucleic acids are NOT DETECTED. The SARS-CoV-2 RNA is generally detectable in upper respiratoy specimens during the acute phase of infection. The lowest concentration of SARS-CoV-2 viral copies this assay can detect is 131 copies/mL. A negative result does not preclude SARS-Cov-2 infection and should not be used as the sole basis for treatment or other patient management decisions. A negative result may occur with  improper specimen collection/handling, submission of specimen other than nasopharyngeal swab, presence of viral mutation(s) within the areas targeted by this assay, and inadequate number of viral copies (<131 copies/mL). A negative result must be combined with clinical observations, patient history, and epidemiological information. The expected result is  Negative. Fact Sheet for Patients:  https://www.moore.com/ Fact Sheet for Healthcare Providers:  https://www.young.biz/ This test is not yet ap proved or cleared by the [place] FDA and  has been authorized for detection and/or diagnosis of SARS-CoV-2 by FDA under an Emergency Use Authorization (EUA). This EUA will remain  in effect (meaning this test can be used) for the duration of the COVID-19 declaration under Section 564(b)(1) of the Act, 21 U.S.C. section 360bbb-3(b)(1), unless the authorization is terminated or revoked sooner.    Influenza A by PCR NEGATIVE NEGATIVE Final   Influenza B by PCR NEGATIVE NEGATIVE Final    Comment: (NOTE) The Xpert Xpress SARS-CoV-2/FLU/RSV assay is intended as an aid in  the diagnosis of influenza from Nasopharyngeal swab specimens and  should not be used as a sole  basis for treatment. Nasal washings and  aspirates are unacceptable for Xpert Xpress SARS-CoV-2/FLU/RSV  testing. Fact Sheet for Patients: https://www.moore.com/ Fact Sheet for Healthcare Providers: https://www.young.biz/ This test is not yet approved or cleared by the [place] FDA and  has been authorized for detection and/or diagnosis of SARS-CoV-2 by  FDA under an Emergency Use Authorization (EUA). This EUA will remain  in effect (meaning this test can be used) for the duration of the  Covid-19 declaration under Section 564(b)(1) of the Act, 21  U.S.C. section 360bbb-3(b)(1), unless the authorization is  terminated or revoked. Performed at [facility], [address], [place], [place] [address]          Radiology Studies: CT Angio Chest PE W and/or Wo Contrast  Result Date: 02/08/2020 CLINICAL DATA:  Tachypnea and short of breath following emesis. Cough. [redacted] weeks pregnant EXAM: CT ANGIOGRAPHY CHEST WITH CONTRAST TECHNIQUE: Multidetector CT imaging of the chest was performed  using the standard protocol during bolus administration of intravenous contrast. Multiplanar CT image reconstructions and MIPs were obtained to evaluate the vascular anatomy. CONTRAST:  49mL OMNIPAQUE IOHEXOL 350 MG/ML SOLN COMPARISON:  Chest 02/08/2020 FINDINGS: Cardiovascular: Satisfactory opacification of the pulmonary arteries to the segmental level. No evidence of pulmonary embolism. Normal heart size. No pericardial effusion. Normal thoracic aorta. Heart size normal. Mediastinum/Nodes: Negative for mass or adenopathy. Lungs/Pleura: Lungs well aerated and clear. No infiltrate effusion or mass. Upper Abdomen: No acute abnormality. Musculoskeletal: Negative Review of the MIP images confirms the above findings. IMPRESSION: Negative CTA chest. Electronically Signed   By: Marlan Palau M.D.   On: 02/08/2020 13:34   US OB Comp + 14 Wk  Result Date: 02/09/2020 CLINICAL DATA:  Fetal dating. EXAM: OBSTETRICAL ULTRASOUND >14 WKS AND TRANSVAGINAL OB US FINDINGS: Number of Fetuses: 1 Heart Rate: 147 bpm Movement: Present Presentation: Breech Previa: No Placental Location: Anterior. There is a question of marginal or velamentous cord insertion again noted. Amniotic Fluid (Subjective): Normal Amniotic Fluid (Objective): Vertical pocket 2.6 cmcm FETAL BIOMETRY BPD:  3.0cm 15w 3d HC:    11.3cm 15w 3d AC:    9.9cm 16w 0d FL:    1.6cm 14w 5d Current Mean GA: 15w 2d [place] EDC: 07/31/2020 Assigned GA: 16w 3d Assigned EDC: 07/23/2020 FETAL ANATOMY Lateral Ventricles: Not visualized Thalami/CSP: Appears normal Posterior Fossa: Not visualized Nuchal Region: Not visualized Upper Lip: Not visualized Spine: Not visualized 4 Chamber Heart on Left: Not visualized LVOT: Not visualized RVOT: Not visualized Stomach on Left: Appears normal 3 Vessel Cord: Appears normal Cord Insertion site: Not visualized Kidneys: Not visualized Bladder: Appears normal Extremities: Appears normal Technical Limitations: Limited exam due to early gestational  age and maternal body habitus. Maternal Findings: Cervix:  3.5 cm and closed. IMPRESSION: 1. Single viable intrauterine pregnancy at 15 weeks 2 days. Breech presentation. 2. Questionable marginal versus velamentous cord insertion again noted. Continued follow-up exam suggested. Electronically Signed   By: Maisie Fus  Register   On: 02/09/2020 10:22   DG Chest Portable 1 View  Result Date: 02/08/2020 CLINICAL DATA:  Chest pain. Epigastric pain and hematemesis. Sixteen weeks pregnant. EXAM: PORTABLE CHEST 1 VIEW COMPARISON:  01/29/2020 FINDINGS: The cardiac silhouette is accentuated by hypoinflation. Lung volumes are low with bronchovascular crowding in the perihilar and basilar regions. No confluent airspace opacity, overt pulmonary edema, pleural effusion, pneumothorax is identified. No acute osseous abnormality is seen. IMPRESSION: Low lung volumes with bibasilar atelectasis. Electronically Signed   By: Jolaine Click.D.  On: 02/08/2020 11:06   US LIVER DOPPLER  Result Date: 02/08/2020 CLINICAL DATA:  Abnormal liver function tests. EXAM: DUPLEX ULTRASOUND OF LIVER TECHNIQUE: Color and duplex Doppler ultrasound was performed to evaluate the hepatic in-flow and out-flow vessels. COMPARISON:  None. FINDINGS: Liver: Normal parenchymal echogenicity. Normal hepatic contour without nodularity. No focal lesion, mass or intrahepatic biliary ductal dilatation. Main Portal Vein size: 3 cm Portal Vein Velocities Main Prox:  43 cm/sec Main Mid: 33 cm/sec Main Dist:  30 cm/sec Right: 18 cm/sec Left: 20 cm/sec Normal hepatopetal flow is noted in the portal veins. Hepatic Vein Velocities Right:  18 cm/sec Middle:  23 cm/sec Left:  14 cm/sec Normal hepatofugal flow is noted in the hepatic veins. IVC: Present and patent with normal respiratory phasicity. Hepatic Artery Velocity:  53 cm/sec Splenic Vein Velocity:  21 cm/sec Spleen: 8.3 cm x 8.5 cm x 4.9 cm with a total volume of 182 cm^3 (411 cm^3 is upper limit normal) Portal  Vein Occlusion/Thrombus: No Splenic Vein Occlusion/Thrombus: No Ascites: None Varices: None IMPRESSION: No evidence of hepatic, portal or splenic venous thrombosis or occlusion. Electronically Signed   By: Lupita Raider M.D.   On: 02/08/2020 15:23   US Abdomen Limited RUQ  Result Date: 02/08/2020 CLINICAL DATA:  Right upper quadrant and epigastric pain. EXAM: ULTRASOUND ABDOMEN LIMITED RIGHT UPPER QUADRANT COMPARISON:  Right upper quadrant ultrasound, 01/29/2020. FINDINGS: Gallbladder: Echogenic material layers dependently in the gallbladder consistent with small stones and sludge, unchanged from the prior study. No wall thickening. No pericholecystic fluid. Common bile duct: Diameter: 3-4 mm. Liver: No focal lesion identified. Within normal limits in parenchymal echogenicity. Portal vein is patent on color Doppler imaging with normal direction of blood flow towards the liver. Other: None. IMPRESSION: 1. No acute findings. 2. Layering echogenic material in the gallbladder consistent with small stones and sludge. No acute cholecystitis. 3. No change from the recent prior right upper quadrant ultrasound. Electronically Signed   By: Amie Portland M.D.   On: 02/08/2020 11:29        Scheduled Meds: . docusate sodium  100 mg Oral BID  . doxylamine (Sleep)  12.5 mg Oral BID   And  . vitamin B-6  25 mg Oral BID  . indocyanine green  1.25 mg Intravenous Once  . pantoprazole (PROTONIX) IV  40 mg Intravenous Q12H  . predniSONE  10 mg Oral Q breakfast  . prenatal multivitamin  1 tablet Oral Q1200  . promethazine  12.5 mg Intravenous Q6H  . sodium chloride (PF)      . sucralfate  1 g Oral TID WC & HS   Continuous Infusions: . sodium chloride 75 mL/hr at 02/10/20 0644  .  ceFAZolin (ANCEF) IV    . lactated ringers Stopped (02/09/20 1508)     LOS: 2 days    Time spent: 33 mins     Charise Killian, MD [facility] Pager [phone]  If 7PM-7AM, please contact  night-coverage [email or web address] 02/10/2020, 8:34 AM

## 2020-02-10 NOTE — Progress Notes (Signed)
[facility] SURGICAL PROGRESS NOTE (cpt 99231/2/3)  Hospital Day(s): 2.     Interval History: Patient seen and examined, improved over yesterday.  Breathing rate diminished.  Less pain/tenderness.  Review of Systems:  Constitutional: denies fever, chills  HEENT: denies cough or congestion  Respiratory: denies any shortness of breath  Cardiovascular: denies chest pain or palpitations  Gastrointestinal: denies abdominal pain, N/V, or diarrhea/and bowel function as per interval history Genitourinary: denies burning with urination or urinary frequency Musculoskeletal: denies pain, decreased motor or sensation Integumentary: denies any other rashes or skin discolorations Neurological: denies HA or vision/hearing changes   Vital signs in last 24 hours: [min-max] current  Temp:  [98.2 F (36.8 C)-99 F (37.2 C)] 98.2 F (36.8 C) (03/27 1159) Pulse Rate:  [84-98] 98 (03/27 1159) Resp:  [36-53] 36 (03/27 1159) BP: (102-124)/(63-69) 124/69 (03/27 1159) SpO2:  [98 %-99 %] 98 % (03/27 1159) Weight:  [122.2 kg] 122.2 kg (03/27 0645)     Height: 5\' 6"  (167.6 cm) Weight: 122.2 kg BMI (Calculated): 43.5   Intake/Output last 2 shifts:  03/26 0701 - 03/27 0700 In: 1202.6 [P.O.:240; I.V.:607.9; IV Piggyback:354.7] Out: 750 [Urine:750]   Physical Exam:  Constitutional: alert, cooperative and no distress  HENT: normocephalic without obvious abnormality  Eyes: PERRL, EOM's grossly intact and symmetric   Respiratory: breathing non-labored, but tachypneic with slightly deeper inspirations.   Cardiovascular: regular rate and sinus rhythm  Gastrointestinal: soft, non-tender, and non-distended Musculoskeletal: UE and LE FROM, no edema or wounds, motor and sensation grossly intact, NT    Labs:  CBC Latest Ref Rng & Units 02/10/2020 02/09/2020 02/08/2020  WBC 4.0 - 10.5 K/uL 8.7 8.4 10.1  Hemoglobin 12.0 - 15.0 g/dL 11.3(L) 10.5(L) 11.2(L)  Hematocrit 36.0 - 46.0 % 32.8(L) 30.3(L)  32.4(L)  Platelets 150 - 400 K/uL 341 336 321   CMP Latest Ref Rng & Units 02/10/2020 02/09/2020 02/09/2020  Glucose 70 - 99 mg/dL 80 89 -  BUN 6 - 20 mg/dL 02/11/2020) <6(Y) -  Creatinine 0.44 - 1.00 mg/dL <5(W 3.89 -  Sodium 3.73 - 145 mmol/L 136 137 138  Potassium 3.5 - 5.1 mmol/L 3.1(L) 3.4(L) 2.9(L)  Chloride 98 - 111 mmol/L 104 106 106  CO2 22 - 32 mmol/L 21(L) 23 23  Calcium 8.9 - 10.3 mg/dL 9.1 9.2 -  Total Protein 6.5 - 8.1 g/dL 6.9 7.3 -  Total Bilirubin 0.3 - 1.2 mg/dL 0.6 0.8 -  Alkaline Phos 38 - 126 U/L 64 66 -  AST 15 - 41 U/L 113(H) 125(H) 115(H)  ALT 0 - 44 U/L 152(H) 164(H) 147(H)     Imaging studies: No new pertinent imaging studies   Assessment/Plan: (ICD-10's: 19 y.o. 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 Patient Active Problem List   Diagnosis Date Noted  . Hematemesis 02/09/2020  . Hypokalemia 02/09/2020  . Tachypnea 02/09/2020  . Abnormal LFTs 02/09/2020  . Pregnancy-16 weeks 02/09/2020  . Pleuritic chest pain 02/09/2020  . Obesity   . Epigastric pain   . Hyperemesis 02/08/2020     - will get HIDA, non-urgent to r/o acute biliary disease/confirm elective nature of surgery considered   - agree with anti-inflammatory agents for pain, will follow.         All of the above findings and recommendations were discussed with the patient and her questions were answered to her expressed satisfaction.   -- 02/10/2020 M.D., [facility] 02/10/2020  4:11 PM

## 2020-02-11 LAB — VARICELLA ZOSTER ANTIBODY, IGG: Varicella IgG: 269 index (ref >165)

## 2020-02-11 LAB — COMPREHENSIVE METABOLIC PANEL
ALT: 150 U/L — ABNORMAL HIGH (ref 0–44)
AST: 109 U/L — ABNORMAL HIGH (ref 15–41)
Albumin: 3.1 g/dL — ABNORMAL LOW (ref 3.5–5.0)
Alkaline Phosphatase: 63 U/L (ref 38–126)
Anion gap: 9 (ref 5–15)
BUN: <5 mg/dL — ABNORMAL LOW (ref 6–20)
CO2: 22 mmol/L (ref 22–32)
Calcium: 9 mg/dL (ref 8.9–10.3)
Chloride: 105 mmol/L (ref 98–111)
Creatinine, Ser: 0.44 mg/dL (ref 0.44–1.00)
GFR calc Af Amer: >60 mL/min (ref >60)
GFR calc non Af Amer: >60 mL/min (ref >60)
Glucose, Bld: 83 mg/dL (ref 70–99)
Potassium: 3.1 mmol/L — ABNORMAL LOW (ref 3.5–5.1)
Sodium: 136 mmol/L (ref 135–145)
Total Bilirubin: 0.6 mg/dL (ref 0.3–1.2)
Total Protein: 6.7 g/dL (ref 6.5–8.1)

## 2020-02-11 LAB — MEASLES/MUMPS/RUBELLA IMMUNITY
Mumps IgG: 46.4 AU/mL (ref >10.9)
Rubella: 1.83 index (ref >0.99)
Rubeola IgG: 52.8 AU/mL (ref >16.4)

## 2020-02-11 LAB — CBC
HCT: 31.7 % — ABNORMAL LOW (ref 36.0–46.0)
Hemoglobin: 10.9 g/dL — ABNORMAL LOW (ref 12.0–15.0)
MCH: 30.1 pg (ref 26.0–34.0)
MCHC: 34.4 g/dL (ref 30.0–36.0)
MCV: 87.6 fL (ref 80.0–100.0)
Platelets: 316 10*3/uL (ref 150–400)
RBC: 3.62 MIL/uL — ABNORMAL LOW (ref 3.87–5.11)
RDW: 13.5 % (ref 11.5–15.5)
WBC: 10.3 10*3/uL (ref 4.0–10.5)
nRBC: 0 % (ref 0.0–0.2)

## 2020-02-11 LAB — TSH: TSH: 4.215 u[IU]/mL (ref 0.350–4.500)

## 2020-02-11 LAB — MAGNESIUM: Magnesium: 1.9 mg/dL (ref 1.7–2.4)

## 2020-02-11 MED ORDER — POTASSIUM CHLORIDE CRYS ER 20 MEQ PO TBCR: 40.0000 meq | EXTENDED_RELEASE_TABLET | Freq: Two times a day (BID) | ORAL | 2 refills | Status: DC

## 2020-02-11 MED ORDER — ONDANSETRON 4 MG PO TBDP: 4.0000 mg | ORAL_TABLET | Freq: Four times a day (QID) | ORAL | 3 refills | Status: DC | PRN

## 2020-02-11 MED ORDER — DOCUSATE SODIUM 100 MG PO CAPS: 200.0000 mg | ORAL_CAPSULE | Freq: Two times a day (BID) | ORAL | Status: DC

## 2020-02-11 MED ORDER — POTASSIUM CHLORIDE CRYS ER 20 MEQ PO TBCR
40.0000 meq | EXTENDED_RELEASE_TABLET | Freq: Two times a day (BID) | ORAL | Status: DC
  Administered 2020-02-11: 40 meq via ORAL
  Filled 2020-02-11: qty 2

## 2020-02-11 MED ORDER — PREDNISONE 10 MG PO TABS: 10.0000 mg | ORAL_TABLET | Freq: Every day | ORAL | 0 refills | Status: DC

## 2020-02-11 MED ORDER — FAMOTIDINE 40 MG PO TABS: 40.0000 mg | ORAL_TABLET | Freq: Every evening | ORAL | 1 refills | Status: DC

## 2020-02-11 MED ORDER — PYRIDOXINE HCL 25 MG PO TABS: 25.0000 mg | ORAL_TABLET | Freq: Two times a day (BID) | ORAL | 2 refills | Status: DC

## 2020-02-11 NOTE — Progress Notes (Signed)
PROGRESS NOTE    Joanne Pacheco  PZW:[id number] DOB: 2000/11/28 DOA: 02/08/2020 PCP: [facility]       Assessment & Plan:   Principal Problem:   Hematemesis Active Problems:   Hyperemesis   Hypokalemia   Tachypnea   Abnormal LFTs   Obesity   Pregnancy-16 weeks   Pleuritic chest pain   Epigastric pain   Pleuritic chest pain: w/ shortness of breath, tachypnea. Not requiring supplemental oxygen. Not using accessory muscles. Etiology unclear.  CT angiogram is negative for PE. CXR shows bibasilar atelectasis.  Per Dr. Belia Heman, pt likely has costochondritis. Continue on prednisone x 7 days. Morphine prn for pain. Encourage incentive spirometry   Hematemesis: etiology unclear, possible Mallory-Weiss tear as per GI. Continue on IV protonix and Carafate. Resolved  Transaminitis: LFTs still elevated but trending down. US showed layering echogenic material in the gallbladder consistent with small stones and sludge. No acute cholecystitis. Hepatitis B, C were neg. Previously scheduled for cholecystectomy but hold now & will continue w/ outpatient f/u as per gen surg. Management per gen surg   Hyperemesis: Zofran, Phenergan prn. Continue on IVFs. Resolved  Hypokalemia: KCl repleted again. Mg is WNL. Will continue to monitor   Morbid obesity:  BMI 43.22. Would benefit from weight loss   Pregnancy-16 weeks: Seems no complications currently. Management per primary OB/GYN team   DVT prophylaxis: encourage ambulation  Code Status: full  Family Communication:  Disposition Plan: defer to primary OBGYN team  Consultants:   Hospitalist service    Procedures:    Antimicrobials:   Subjective: Pt c/o nausea  Objective: Vitals:   02/11/20 0312 02/11/20 0413 02/11/20 0450 02/11/20 0744  BP: (!) 106/58   109/72  Pulse: 87   79  Resp: (!) 25 (!) 25 (!) 24 (!) 22  Temp: 98.1 F (36.7 C)   98.3 F (36.8 C)  TempSrc: Oral   Oral  SpO2: 98%   98%    Weight:  122.7 kg    Height:        Intake/Output Summary (Last 24 hours) at 02/11/2020 0910 Last data filed at 02/11/2020 0316 Gross per 24 hour  Intake 2561.45 ml  Output 2400 ml  Net 161.45 ml   Filed Weights   02/09/20 0900 02/10/20 0645 02/11/20 0413  Weight: 121.5 kg 122.2 kg 122.7 kg    Examination:  General exam: Appears calm and comfortable  Respiratory system: decreased breath sounds b/l otherwise clear. Respiratory effort normal. No accessory muscle use Cardiovascular system: S1 & S2 +. No  rubs, gallops or clicks.  Gastrointestinal system: Abdomen is obese, soft and nontender.  Hypoactive bowel sounds heard. Central nervous system: Alert and oriented. Moves all 4 extremities  Psychiatry: Judgement and insight appear normal. Mood & affect appropriate.     Data Reviewed: I have personally reviewed following labs and imaging studies  CBC: Recent Labs  Lab 02/08/20 0855 02/08/20 1306 02/09/20 0532 02/10/20 0622 02/11/20 0751  WBC 10.5 10.1 8.4 8.7 10.3  HGB 12.5 11.2* 10.5* 11.3* 10.9*  HCT 35.6* 32.4* 30.3* 32.8* 31.7*  MCV 85.2 87.1 87.3 87.7 87.6  PLT 393 321 336 341 316   Basic Metabolic Panel: Recent Labs  Lab 02/08/20 0855 02/08/20 0855 02/08/20 1046 02/08/20 1346 02/09/20 0532 02/09/20 1504 02/10/20 0857 02/11/20 0751  NA 136   < >  --  137 138 137 136 136  K 2.6*   < >  --  2.9* 2.9* 3.4* 3.1* 3.1*  CL  104   < >  --  103 106 106 104 105  CO2 21*   < >  --  23 23 23  21* 22  GLUCOSE 97  --   --  88  --  89 80 83  BUN 8  --   --  6  --  <5* <5* <5*  CREATININE 0.40*  --   --  0.46  --  0.47 0.46 0.44  CALCIUM 9.4  --   --  9.2  --  9.2 9.1 9.0  MG  --   --  2.2  --   --   --   --   --    < > = values in this interval not displayed.   GFR: Estimated Creatinine Clearance: 151.2 mL/min (by C-G formula based on SCr of 0.44 mg/dL). Liver Function Tests: Recent Labs  Lab 02/08/20 0855 02/08/20 0855 02/08/20 1346 02/09/20 0532  02/09/20 1504 02/10/20 0857 02/11/20 0751  AST 111*   < > 104* 115* 125* 113* 109*  ALT 156*   < > 145* 147* 164* 152* 150*  ALKPHOS 71  --  66  --  66 64 63  BILITOT 1.0  --  1.1  --  0.8 0.6 0.6  PROT 8.3*  --  7.5  --  7.3 6.9 6.7  ALBUMIN 3.9  --  3.6  --  3.4* 3.2* 3.1*   < > = values in this interval not displayed.   Recent Labs  Lab 02/08/20 0855  LIPASE 40   No results for input(s): AMMONIA in the last 168 hours. Coagulation Profile: Recent Labs  Lab 02/08/20 1114  INR 1.0   Cardiac Enzymes: No results for input(s): CKTOTAL, CKMB, CKMBINDEX, TROPONINI in the last 168 hours. BNP (last 3 results) No results for input(s): PROBNP in the last 8760 hours. HbA1C: Recent Labs    02/09/20 2027  HGBA1C 5.2   CBG: No results for input(s): GLUCAP in the last 168 hours. Lipid Profile: No results for input(s): CHOL, HDL, LDLCALC, TRIG, CHOLHDL, LDLDIRECT in the last 72 hours. Thyroid Function Tests: Recent Labs    02/11/20 0751  TSH 4.215   Anemia Panel: No results for input(s): VITAMINB12, FOLATE, FERRITIN, TIBC, IRON, RETICCTPCT in the last 72 hours. Sepsis Labs: Recent Labs  Lab 02/08/20 1114 02/08/20 1405  LATICACIDVEN 1.4 1.1    Recent Results (from the past 240 hour(s))  Respiratory Panel by RT PCR (Flu A&B, Covid) - Nasopharyngeal Swab     Status: None   Collection Time: 02/08/20 11:14 AM   Specimen: Nasopharyngeal Swab  Result Value Ref Range Status   SARS Coronavirus 2 by RT PCR NEGATIVE NEGATIVE Final    Comment: (NOTE) SARS-CoV-2 target nucleic acids are NOT DETECTED. The SARS-CoV-2 RNA is generally detectable in upper respiratoy specimens during the acute phase of infection. The lowest concentration of SARS-CoV-2 viral copies this assay can detect is 131 copies/mL. A negative result does not preclude SARS-Cov-2 infection and should not be used as the sole basis for treatment or other patient management decisions. A negative result may occur  with  improper specimen collection/handling, submission of specimen other than nasopharyngeal swab, presence of viral mutation(s) within the areas targeted by this assay, and inadequate number of viral copies (<131 copies/mL). A negative result must be combined with clinical observations, patient history, and epidemiological information. The expected result is Negative. Fact Sheet for Patients:  02/10/20 Fact Sheet for Healthcare Providers:  https://www.moore.com/ This test is  not yet ap proved or cleared by the [place] and  has been authorized for detection and/or diagnosis of SARS-CoV-2 by FDA under an Emergency Use Authorization (EUA). This EUA will remain  in effect (meaning this test can be used) for the duration of the COVID-19 declaration under Section 564(b)(1) of the Act, 21 U.S.C. section 360bbb-3(b)(1), unless the authorization is terminated or revoked sooner.    Influenza A by PCR NEGATIVE NEGATIVE Final   Influenza B by PCR NEGATIVE NEGATIVE Final    Comment: (NOTE) The Xpert Xpress SARS-CoV-2/FLU/RSV assay is intended as an aid in  the diagnosis of influenza from Nasopharyngeal swab specimens and  should not be used as a sole basis for treatment. Nasal washings and  aspirates are unacceptable for Xpert Xpress SARS-CoV-2/FLU/RSV  testing. Fact Sheet for Patients: https://www.moore.com/ Fact Sheet for Healthcare Providers: https://www.young.biz/ This test is not yet approved or cleared by the [place] FDA and  has been authorized for detection and/or diagnosis of SARS-CoV-2 by  FDA under an Emergency Use Authorization (EUA). This EUA will remain  in effect (meaning this test can be used) for the duration of the  Covid-19 declaration under Section 564(b)(1) of the Act, 21  U.S.C. section 360bbb-3(b)(1), unless the authorization is  terminated or revoked. Performed  at [facility], [address], [place], [place] [address]          Radiology Studies: [place] Comp + 14 Wk  Result Date: 02/09/2020 CLINICAL DATA:  Fetal dating. EXAM: OBSTETRICAL ULTRASOUND >14 WKS AND TRANSVAGINAL OB US FINDINGS: Number of Fetuses: 1 Heart Rate: 147 bpm Movement: Present Presentation: Breech Previa: No Placental Location: Anterior. There is a question of marginal or velamentous cord insertion again noted. Amniotic Fluid (Subjective): Normal Amniotic Fluid (Objective): Vertical pocket 2.6 cmcm FETAL BIOMETRY BPD:  3.0cm 15w 3d HC:    11.3cm 15w 3d AC:    9.9cm 16w 0d FL:    1.6cm 14w 5d Current Mean GA: 15w 2d [place] EDC: 07/31/2020 Assigned GA: 16w 3d Assigned EDC: 07/23/2020 FETAL ANATOMY Lateral Ventricles: Not visualized Thalami/CSP: Appears normal Posterior Fossa: Not visualized Nuchal Region: Not visualized Upper Lip: Not visualized Spine: Not visualized 4 Chamber Heart on Left: Not visualized LVOT: Not visualized RVOT: Not visualized Stomach on Left: Appears normal 3 Vessel Cord: Appears normal Cord Insertion site: Not visualized Kidneys: Not visualized Bladder: Appears normal Extremities: Appears normal Technical Limitations: Limited exam due to early gestational age and maternal body habitus. Maternal Findings: Cervix:  3.5 cm and closed. IMPRESSION: 1. Single viable intrauterine pregnancy at 15 weeks 2 days. Breech presentation. 2. Questionable marginal versus velamentous cord insertion again noted. Continued follow-up exam suggested. Electronically Signed   By: Maisie Fus  Register   On: 02/09/2020 10:22        Scheduled Meds: . docusate sodium  100 mg Oral BID  . doxylamine (Sleep)  12.5 mg Oral BID   And  . vitamin B-6  25 mg Oral BID  . indocyanine green  1.25 mg Intravenous Once  . pantoprazole (PROTONIX) IV  40 mg Intravenous Q12H  . potassium chloride  40 mEq Oral BID  . predniSONE  10 mg Oral Q breakfast  . prenatal multivitamin  1 tablet Oral Q1200   . promethazine  12.5 mg Intravenous Q6H  . sucralfate  1 g Oral TID WC & HS   Continuous Infusions: . sodium chloride 75 mL/hr at 02/11/20 0316     LOS: 3 days  Time spent: 30 mins     Wyvonnia Dusky, MD [facility] Pager [phone]  If 7PM-7AM, please contact night-coverage [email or web address] 02/11/2020, 9:10 AM

## 2020-02-11 NOTE — Progress Notes (Signed)
Sleeping. Awoke easily for VS/Assessment. Afeb., HR Reg, B/P WNL and RR is 21 and unlabored. Color good, skin w&d. Pt. Denies LUQ Pain and denies chest pain and/or Nausea. Appears to be resting well in NAD.

## 2020-02-11 NOTE — Discharge Instructions (Signed)
Hyperemesis Gravidarum Hyperemesis gravidarum is a severe form of nausea and vomiting that happens during pregnancy. Hyperemesis is worse than morning sickness. It may cause you to have nausea or vomiting all day for many days. It may keep you from eating and drinking enough food and liquids, which can lead to dehydration, malnutrition, and weight loss. Hyperemesis usually occurs during the first half (the first 20 weeks) of pregnancy. It often goes away once a woman is in her second half of pregnancy. However, sometimes hyperemesis continues through an entire pregnancy. What are the causes? The cause of this condition is not known. It may be related to changes in chemicals (hormones) in the body during pregnancy, such as the high level of pregnancy hormone (human chorionic gonadotropin) or the increase in the female sex hormone (estrogen). What are the signs or symptoms? Symptoms of this condition include:  Nausea that does not go away.  Vomiting that does not allow you to keep any food down.  Weight loss.  Body fluid loss (dehydration).  Having no desire to eat, or not liking food that you have previously enjoyed. How is this diagnosed? This condition may be diagnosed based on:  A physical exam.  Your medical history.  Your symptoms.  Blood tests.  Urine tests. How is this treated? This condition is managed by controlling symptoms. This may include:  Following an eating plan. This can help lessen nausea and vomiting.  Taking prescription medicines. An eating plan and medicines are often used together to help control symptoms. If medicines do not help relieve nausea and vomiting, you may need to receive fluids through an IV at the hospital. Follow these instructions at home: Eating and drinking   Avoid the following: ? Drinking fluids with meals. Try not to drink anything during the 30 minutes before and after your meals. ? Drinking more than 1 cup of fluid at a  time. ? Eating foods that trigger your symptoms. These may include spicy foods, coffee, high-fat foods, very sweet foods, and acidic foods. ? Skipping meals. Nausea can be more intense on an empty stomach. If you cannot tolerate food, do not force it. Try sucking on ice chips or other frozen items and make up for missed calories later. ? Lying down within 2 hours after eating. ? Being exposed to environmental triggers. These may include food smells, smoky rooms, closed spaces, rooms with strong smells, warm or humid places, overly loud and noisy rooms, and rooms with motion or flickering lights. Try eating meals in a well-ventilated area that is free of strong smells. ? Quick and sudden changes in your movement. ? Taking iron pills and multivitamins that contain iron. If you take prescription iron pills, do not stop taking them unless your health care provider approves. ? Preparing food. The smell of food can spoil your appetite or trigger nausea.  To help relieve your symptoms: ? Listen to your body. Everyone is different and has different preferences. Find what works best for you. ? Eat and drink slowly. ? Eat 5-6 small meals daily instead of 3 large meals. Eating small meals and snacks can help you avoid an empty stomach. ? In the morning, before getting out of bed, eat a couple of crackers to avoid moving around on an empty stomach. ? Try eating starchy foods as these are usually tolerated well. Examples include cereal, toast, bread, potatoes, pasta, rice, and pretzels. ? Include at least 1 serving of protein with your meals and snacks. Protein options include   lean meats, poultry, seafood, beans, nuts, nut butters, eggs, cheese, and yogurt. ? Try eating a protein-rich snack before bed. Examples of a protein-rick snack include cheese and crackers or a peanut butter sandwich made with 1 slice of whole-wheat bread and 1 tsp (5 g) of peanut butter. ? Eat or suck on things that have ginger in them.  It may help relieve nausea. Add  tsp ground ginger to hot tea or choose ginger tea. ? Try drinking 100% fruit juice or an electrolyte drink. An electrolyte drink contains sodium, potassium, and chloride. ? Drink fluids that are cold, clear, and carbonated or sour. Examples include lemonade, ginger ale, lemon-lime soda, ice water, and sparkling water. ? Brush your teeth or use a mouth rinse after meals. ? Talk with your health care provider about starting a supplement of vitamin B6. General instructions  Take over-the-counter and prescription medicines only as told by your health care provider.  Follow instructions from your health care provider about eating or drinking restrictions.  Continue to take your prenatal vitamins as told by your health care provider. If you are having trouble taking your prenatal vitamins, talk with your health care provider about different options.  Keep all follow-up and pre-birth (prenatal) visits as told by your health care provider. This is important. Contact a health care provider if:  You have pain in your abdomen.  You have a severe headache.  You have vision problems.  You are losing weight.  You feel weak or dizzy. Get help right away if:  You cannot drink fluids without vomiting.  You vomit blood.  You have constant nausea and vomiting.  You are very weak.  You faint.  You have a fever and your symptoms suddenly get worse. Summary  Hyperemesis gravidarum is a severe form of nausea and vomiting that happens during pregnancy.  Making some changes to your eating habits may help relieve nausea and vomiting.  This condition may be managed with medicine.  If medicines do not help relieve nausea and vomiting, you may need to receive fluids through an IV at the hospital. This information is not intended to replace advice given to you by your health care provider. Make sure you discuss any questions you have with your health care  provider. Document Revised: 11/22/2017 Document Reviewed: 07/01/2016 Elsevier Patient Education  2020 [employer]  

## 2020-02-11 NOTE — Progress Notes (Addendum)
Discharge orders placed by provider, Dr. Tiburcio Pea. Hepato imaging ordered yesterday by surgeon, Dr. Claudine Mouton, but cannot be completed until tomorrow (3/29). Surgeon aware of this. RN updated Dr. Tiburcio Pea about imaging and clarified if patient would still be discharged; RN instructed to contact surgeon about possibly doing this as an outpatient. RN updated Careers adviser. Per surgeon, imaging can be canceled. Patient to contact OB's office if she is continuing to have worsening symptoms after discharge. Patient updated. Patient will be discharged today.

## 2020-02-11 NOTE — Discharge Summary (Signed)
Physician Discharge Summary  Patient ID: Joanne Pacheco MRN: [id number] DOB/AGE: 01/08/2001 18 y.o.  Admit date: 02/08/2020 Discharge date: 02/11/2020  Admission Diagnoses:   Hematemesis   Hyperemesis   Hypokalemia   Tachypnea   Abnormal LFTs   Obesity   Pregnancy-16 weeks   Pleuritic chest pain   Epigastric pain  Discharge Diagnoses:  Principal Problem:   Hematemesis Active Problems:   Hyperemesis   Hypokalemia   Tachypnea   Abnormal LFTs   Obesity   Pregnancy-16 weeks   Pleuritic chest pain   Epigastric pain  Discharged Condition: good  Hospital Course: [facility] from work with h/o vomiting of blood for two days. She reports that any time she eats or drinks water she vomits penny sized clots in acidic fluid. She reports that she has been nauseous since she became pregnant which has been about 4 months. She does not take any medication for the nausea. At home she can not keep down food or water. She vomits usually multiple times a day, generally 10 or more times a day. If she eats of drinks or smells something she will vomit. She likes soft foods. She reports eating fruit, chicken, and pretzels.  Additionally she reports that she has had dizziness. In mornings or when she drives she feels like her vision is blurred and she gets bad headaches. If she throws up a lot she will become dizzy.  She has been having substernal chest pain for over a week. It is painful for her to take deep breaths.     She has not had uterine cramping. About a month ago she had small amounts of blood when she wiped which she interpreted as implantation bleeding. She feels some small fetal movements.   GYN History Not using contraceptions when she got pregnant.  She has a history of irregular periods.  LMP 10/17/2019 Denies STD Denies fibrodis, polyps, ovarian cysts Denies history of endometriosis Denies  Any previous pregnancies.   OB History  G1P0 She has not had any  obstetrical care thus far in her pregnancy. She reports that she would like to deliver [facility] at [facility].  She has been taking a Prenatal vitamin  She was admitted and seen by GI, Gen Surgery, Internal Medicine, as well as Ob.   She was monitored for her tachypnea, which as improved with steroids and other measures (aklways shoed high 90s% saturation.  She was considered for cholecystectomy in case that was cause for her pain or inflammation or lab changes, and this is on hold and being further evaluated as an outpatient per Gen Surg instructions.  She is on acid blocker medicine.  Her hyperemesis symptoms have improved, tolerating regular diet several times prior to discharge.    She is discharged with medicine for GERD, steroids, anti-emetics, and vitamins (for potassium repletion, and prenatals).  Appt tomorrow at [facility] to start prenatal care there.  Consults: pulmonary/intensive care, GI and general surgery  Significant Diagnostic Studies:  Results for orders placed or performed during the hospital encounter of 02/08/20  Respiratory Panel by RT PCR (Flu A&B, Covid) - Nasopharyngeal Swab   Specimen: Nasopharyngeal Swab  Result Value Ref Range   SARS Coronavirus 2 by RT PCR NEGATIVE NEGATIVE   Influenza A by PCR NEGATIVE NEGATIVE   Influenza B by PCR NEGATIVE NEGATIVE  Comprehensive metabolic panel  Result Value Ref Range   Sodium 136 135 - 145 mmol/L   Potassium 2.6 (LL) 3.5 - 5.1 mmol/L  Chloride 104 98 - 111 mmol/L   CO2 21 (L) 22 - 32 mmol/L   Glucose, Bld 97 70 - 99 mg/dL   BUN 8 6 - 20 mg/dL   Creatinine, Ser 0.40 (L) 0.44 - 1.00 mg/dL   Calcium 9.4 8.9 - 10.3 mg/dL   Total Protein 8.3 (H) 6.5 - 8.1 g/dL   Albumin 3.9 3.5 - 5.0 g/dL   AST 111 (H) 15 - 41 U/L   ALT 156 (H) 0 - 44 U/L   Alkaline Phosphatase 71 38 - 126 U/L   Total Bilirubin 1.0 0.3 - 1.2 mg/dL   GFR calc non Af Amer >60 >60 mL/min   GFR calc Af Amer >60 >60 mL/min   Anion gap 11 5 - 15  CBC  Result Value Ref  Range   WBC 10.5 4.0 - 10.5 K/uL   RBC 4.18 3.87 - 5.11 MIL/uL   Hemoglobin 12.5 12.0 - 15.0 g/dL   HCT 35.6 (L) 36.0 - 46.0 %   MCV 85.2 80.0 - 100.0 fL   MCH 29.9 26.0 - 34.0 pg   MCHC 35.1 30.0 - 36.0 g/dL   RDW 13.2 11.5 - 15.5 %   Platelets 393 150 - 400 K/uL   nRBC 0.0 0.0 - 0.2 %  Lipase, blood  Result Value Ref Range   Lipase 40 11 - 51 U/L  Magnesium  Result Value Ref Range   Magnesium 2.2 1.7 - 2.4 mg/dL  Acetaminophen level  Result Value Ref Range   Acetaminophen (Tylenol), Serum <10 (L) 10 - 30 ug/mL  Hepatitis panel, acute  Result Value Ref Range   Hepatitis B Surface Ag NON REACTIVE NON REACTIVE   HCV Ab NON REACTIVE NON REACTIVE   Hep A IgM NON REACTIVE NON REACTIVE   Hep B C IgM NON REACTIVE NON REACTIVE  Brain natriuretic peptide  Result Value Ref Range   B Natriuretic Peptide 39.0 0.0 - 100.0 pg/mL  Blood gas, arterial  Result Value Ref Range   FIO2 0.21    pH, Arterial 7.47 (H) 7.350 - 7.450   pCO2 arterial 34 32.0 - 48.0 mmHg   pO2, Arterial 89 83.0 - 108.0 mmHg   Bicarbonate 24.7 20.0 - 28.0 mmol/L   Acid-Base Excess 1.5 0.0 - 2.0 mmol/L   O2 Saturation 97.4 %   Patient temperature 37.0    Collection site RIGHT RADIAL    Sample type ARTERIAL DRAW    Allens test (pass/fail) PASS PASS  Protime-INR  Result Value Ref Range   Prothrombin Time 13.1 11.4 - 15.2 seconds   INR 1.0 0.8 - 1.2  Lactic acid, plasma  Result Value Ref Range   Lactic Acid, Venous 1.4 0.5 - 1.9 mmol/L  Lactic acid, plasma  Result Value Ref Range   Lactic Acid, Venous 1.1 0.5 - 1.9 mmol/L  CBC  Result Value Ref Range   WBC 10.1 4.0 - 10.5 K/uL   RBC 3.72 (L) 3.87 - 5.11 MIL/uL   Hemoglobin 11.2 (L) 12.0 - 15.0 g/dL   HCT 32.4 (L) 36.0 - 46.0 %   MCV 87.1 80.0 - 100.0 fL   MCH 30.1 26.0 - 34.0 pg   MCHC 34.6 30.0 - 36.0 g/dL   RDW 13.2 11.5 - 15.5 %   Platelets 321 150 - 400 K/uL   nRBC 0.0 0.0 - 0.2 %  Urinalysis, Routine w reflex microscopic  Result Value Ref Range    Color, Urine AMBER (A) YELLOW   APPearance HAZY (  A) CLEAR   Specific Gravity, Urine >1.046 (H) 1.005 - 1.030   pH 6.0 5.0 - 8.0   Glucose, UA NEGATIVE NEGATIVE mg/dL   Hgb urine dipstick NEGATIVE NEGATIVE   Bilirubin Urine NEGATIVE NEGATIVE   Ketones, ur 80 (A) NEGATIVE mg/dL   Protein, ur 30 (A) NEGATIVE mg/dL   Nitrite NEGATIVE NEGATIVE   Leukocytes,Ua SMALL (A) NEGATIVE   RBC / HPF 0-5 0 - 5 RBC/hpf   WBC, UA 11-20 0 - 5 WBC/hpf   Bacteria, UA NONE SEEN NONE SEEN   Squamous Epithelial / LPF 21-50 0 - 5   Mucus PRESENT   EBV ab to viral capsid ag pnl, IgG+IgM  Result Value Ref Range   EBV VCA IgG 169.0 (H) 0.0 - 17.9 U/mL   EBV VCA IgM <36.0 0.0 - 35.9 U/mL  Varicella-zoster by PCR  Result Value Ref Range   Varicella-Zoster, PCR Negative Negative  Hepatitis B core antibody, IgM  Result Value Ref Range   Hep B C IgM NON REACTIVE NON REACTIVE  Hepatitis B DNA, ultraquantitative, PCR  Result Value Ref Range   HBV DNA SERPL PCR-ACNC HBV DNA not detected IU/mL   HBV DNA SERPL PCR-LOG IU UNABLE TO CALCULATE log10 IU/mL   Test Info: Comment   Hepatitis B E antigen  Result Value Ref Range   Hep B E Ag Negative Negative  Hepatitis B e antibody  Result Value Ref Range   Hep B E Ab Negative Negative  Hepatitis B surface antibody  Result Value Ref Range   Hepatitis B-Post 6.9 (L) Immunity>9.9 mIU/mL  Hepatitis c vrs RNA detect by PCR-qual  Result Value Ref Range   Hepatitis C Vrs RNA by PCR-Qual Negative Negative  Comprehensive metabolic panel  Result Value Ref Range   Sodium 137 135 - 145 mmol/L   Potassium 2.9 (L) 3.5 - 5.1 mmol/L   Chloride 103 98 - 111 mmol/L   CO2 23 22 - 32 mmol/L   Glucose, Bld 88 70 - 99 mg/dL   BUN 6 6 - 20 mg/dL   Creatinine, Ser 0.98 0.44 - 1.00 mg/dL   Calcium 9.2 8.9 - 11.9 mg/dL   Total Protein 7.5 6.5 - 8.1 g/dL   Albumin 3.6 3.5 - 5.0 g/dL   AST 147 (H) 15 - 41 U/L   ALT 145 (H) 0 - 44 U/L   Alkaline Phosphatase 66 38 - 126 U/L    Total Bilirubin 1.1 0.3 - 1.2 mg/dL   GFR calc non Af Amer >60 >60 mL/min   GFR calc Af Amer >60 >60 mL/min   Anion gap 11 5 - 15  Salicylate level  Result Value Ref Range   Salicylate Lvl <7.0 (L) 7.0 - 30.0 mg/dL  HIV Antibody (routine testing w rflx)  Result Value Ref Range   HIV Screen 4th Generation wRfx NON REACTIVE NON REACTIVE  CBC  Result Value Ref Range   WBC 8.4 4.0 - 10.5 K/uL   RBC 3.47 (L) 3.87 - 5.11 MIL/uL   Hemoglobin 10.5 (L) 12.0 - 15.0 g/dL   HCT 82.9 (L) 56.2 - 13.0 %   MCV 87.3 80.0 - 100.0 fL   MCH 30.3 26.0 - 34.0 pg   MCHC 34.7 30.0 - 36.0 g/dL   RDW 86.5 78.4 - 69.6 %   Platelets 336 150 - 400 K/uL   nRBC 0.0 0.0 - 0.2 %  Electrolyte panel  Result Value Ref Range   Sodium 138 135 - 145 mmol/L  Potassium 2.9 (L) 3.5 - 5.1 mmol/L   Chloride 106 98 - 111 mmol/L   CO2 23 22 - 32 mmol/L   Anion gap 9 5 - 15  AST  Result Value Ref Range   AST 115 (H) 15 - 41 U/L  ALT  Result Value Ref Range   ALT 147 (H) 0 - 44 U/L  RPR  Result Value Ref Range   RPR Ser Ql NON REACTIVE NON REACTIVE  Hemoglobin A1c  Result Value Ref Range   Hgb A1c MFr Bld 5.2 4.8 - 5.6 %   Mean Plasma Glucose 102.54 mg/dL  Brain natriuretic peptide  Result Value Ref Range   B Natriuretic Peptide 27.0 0.0 - 100.0 pg/mL  Urine Drug Screen, Qualitative ([facility] only)  Result Value Ref Range   Tricyclic, Ur Screen NONE DETECTED NONE DETECTED   Amphetamines, Ur Screen NONE DETECTED NONE DETECTED   MDMA (Ecstasy)Ur Screen NONE DETECTED NONE DETECTED   Cocaine Metabolite,Ur [place] NONE DETECTED NONE DETECTED   Opiate, Ur Screen NONE DETECTED NONE DETECTED   Phencyclidine (PCP) Ur S NONE DETECTED NONE DETECTED   Cannabinoid 50 Ng, Ur [place] NONE DETECTED NONE DETECTED   Barbiturates, Ur Screen NONE DETECTED NONE DETECTED   Benzodiazepine, Ur Scrn NONE DETECTED NONE DETECTED   Methadone Scn, Ur NONE DETECTED NONE DETECTED  Comprehensive metabolic panel  Result Value Ref Range   Sodium 137  135 - 145 mmol/L   Potassium 3.4 (L) 3.5 - 5.1 mmol/L   Chloride 106 98 - 111 mmol/L   CO2 23 22 - 32 mmol/L   Glucose, Bld 89 70 - 99 mg/dL   BUN <5 (L) 6 - 20 mg/dL   Creatinine, Ser 2.67 0.44 - 1.00 mg/dL   Calcium 9.2 8.9 - 12.4 mg/dL   Total Protein 7.3 6.5 - 8.1 g/dL   Albumin 3.4 (L) 3.5 - 5.0 g/dL   AST 580 (H) 15 - 41 U/L   ALT 164 (H) 0 - 44 U/L   Alkaline Phosphatase 66 38 - 126 U/L   Total Bilirubin 0.8 0.3 - 1.2 mg/dL   GFR calc non Af Amer >60 >60 mL/min   GFR calc Af Amer >60 >60 mL/min   Anion gap 8 5 - 15  CBC  Result Value Ref Range   WBC 8.7 4.0 - 10.5 K/uL   RBC 3.74 (L) 3.87 - 5.11 MIL/uL   Hemoglobin 11.3 (L) 12.0 - 15.0 g/dL   HCT 99.8 (L) 33.8 - 25.0 %   MCV 87.7 80.0 - 100.0 fL   MCH 30.2 26.0 - 34.0 pg   MCHC 34.5 30.0 - 36.0 g/dL   RDW 53.9 76.7 - 34.1 %   Platelets 341 150 - 400 K/uL   nRBC 0.0 0.0 - 0.2 %  Comprehensive metabolic panel  Result Value Ref Range   Sodium 136 135 - 145 mmol/L   Potassium 3.1 (L) 3.5 - 5.1 mmol/L   Chloride 104 98 - 111 mmol/L   CO2 21 (L) 22 - 32 mmol/L   Glucose, Bld 80 70 - 99 mg/dL   BUN <5 (L) 6 - 20 mg/dL   Creatinine, Ser 9.37 0.44 - 1.00 mg/dL   Calcium 9.1 8.9 - 90.2 mg/dL   Total Protein 6.9 6.5 - 8.1 g/dL   Albumin 3.2 (L) 3.5 - 5.0 g/dL   AST 409 (H) 15 - 41 U/L   ALT 152 (H) 0 - 44 U/L   Alkaline Phosphatase 64 38 - 126  U/L   Total Bilirubin 0.6 0.3 - 1.2 mg/dL   GFR calc non Af Amer >60 >60 mL/min   GFR calc Af Amer >60 >60 mL/min   Anion gap 11 5 - 15  TSH  Result Value Ref Range   TSH 4.215 0.350 - 4.500 uIU/mL  Comprehensive metabolic panel  Result Value Ref Range   Sodium 136 135 - 145 mmol/L   Potassium 3.1 (L) 3.5 - 5.1 mmol/L   Chloride 105 98 - 111 mmol/L   CO2 22 22 - 32 mmol/L   Glucose, Bld 83 70 - 99 mg/dL   BUN <5 (L) 6 - 20 mg/dL   Creatinine, Ser 3.71 0.44 - 1.00 mg/dL   Calcium 9.0 8.9 - 69.6 mg/dL   Total Protein 6.7 6.5 - 8.1 g/dL   Albumin 3.1 (L) 3.5 - 5.0 g/dL    AST 789 (H) 15 - 41 U/L   ALT 150 (H) 0 - 44 U/L   Alkaline Phosphatase 63 38 - 126 U/L   Total Bilirubin 0.6 0.3 - 1.2 mg/dL   GFR calc non Af Amer >60 >60 mL/min   GFR calc Af Amer >60 >60 mL/min   Anion gap 9 5 - 15  CBC  Result Value Ref Range   WBC 10.3 4.0 - 10.5 K/uL   RBC 3.62 (L) 3.87 - 5.11 MIL/uL   Hemoglobin 10.9 (L) 12.0 - 15.0 g/dL   HCT 38.1 (L) 01.7 - 51.0 %   MCV 87.6 80.0 - 100.0 fL   MCH 30.1 26.0 - 34.0 pg   MCHC 34.4 30.0 - 36.0 g/dL   RDW 25.8 52.7 - 78.2 %   Platelets 316 150 - 400 K/uL   nRBC 0.0 0.0 - 0.2 %  Magnesium  Result Value Ref Range   Magnesium 1.9 1.7 - 2.4 mg/dL  Type and screen [facility]  Result Value Ref Range   ABO/RH(D) O POS    Antibody Screen NEG    Sample Expiration      02/11/2020,2359 Performed at [facility], [address] [place], [place] [address]   Troponin I (High Sensitivity)  Result Value Ref Range   Troponin I (High Sensitivity) 5 <18 ng/L  Troponin I (High Sensitivity)  Result Value Ref Range   Troponin I (High Sensitivity) 20 (H) <18 ng/L  Troponin I (High Sensitivity)  Result Value Ref Range   Troponin I (High Sensitivity) 20 (H) <18 ng/L  Troponin I (High Sensitivity)  Result Value Ref Range   Troponin I (High Sensitivity) 20 (H) <18 ng/L   Radiology- Prenatal ultrasound done, marginal insertion of cord Liver US, CT, CXR also performed for consultants  Treatments: IV hydration, antibiotics: Ancef, steroids: prednisone and respiratory therapy: IS  Discharge Exam: Blood pressure 109/72, pulse 79, temperature 98.3 F (36.8 C), temperature source Oral, resp. rate (!) 22, height 5\' 6"  (1.676 m), weight 122.7 kg, last menstrual period 10/17/2019, SpO2 98 %. General appearance: alert, cooperative and no distress Head: Normocephalic, without obvious abnormality, atraumatic Neck: no adenopathy, supple, symmetrical, trachea midline and thyroid not enlarged, symmetric, no  tenderness/mass/nodules Resp: clear to auscultation bilaterally Cardio: regular rate and rhythm, S1, S2 normal, no murmur, click, rub or gallop GI: soft, non-tender; bowel sounds normal; no masses,  no organomegaly and FHT 15-s Extremities: extremities normal, atraumatic, no cyanosis or edema Skin: Skin color, texture, turgor normal. No rashes or lesions  Disposition: Discharge disposition: 01-Home or Self Care  Discharge Instructions    Call MD for:  difficulty breathing, headache or visual disturbances   Complete by: As directed    Call MD for:  persistant dizziness or light-headedness   Complete by: As directed    Call MD for:  persistant nausea and vomiting   Complete by: As directed    Call MD for:  severe uncontrolled pain   Complete by: As directed    Call MD for:  temperature >100.4   Complete by: As directed      Allergies as of 02/11/2020   No Known Allergies     Medication List    STOP taking these medications   Doxylamine-Pyridoxine 10-10 MG Tbec Commonly known as: Diclegis   ondansetron 4 MG tablet Commonly known as: Zofran   pantoprazole 40 MG tablet Commonly known as: Protonix   potassium chloride 10 MEQ tablet Commonly known as: KLOR-CON     TAKE these medications   famotidine 40 MG tablet Commonly known as: PEPCID Take 1 tablet (40 mg total) by mouth every evening.   ondansetron 4 MG disintegrating tablet Commonly known as: Zofran ODT Take 1 tablet (4 mg total) by mouth every 6 (six) hours as needed for nausea.   potassium chloride SA 20 MEQ tablet Commonly known as: KLOR-CON Take 2 tablets (40 mEq total) by mouth 2 (two) times daily.   predniSONE 10 MG tablet Commonly known as: DELTASONE Take 1 tablet (10 mg total) by mouth daily with breakfast. For 5 more days Start taking on: February 12, 2020   prenatal multivitamin Tabs tablet Take 1 tablet by mouth daily at 12 noon.   pyridOXINE 25 MG tablet Commonly known as: VITAMIN  B-6 Take 1 tablet (25 mg total) by mouth 2 (two) times daily. Helps with nausea      Follow-up Information    [facility]. Schedule an appointment as soon as possible for a visit in 1 day(s).   Specialty: Obstetrics and Gynecology Contact information: [address] [place] [address] [phone]          Signed: Letitia Libra 02/11/2020, 10:44 AM

## 2020-02-11 NOTE — Progress Notes (Signed)
Sleeping, Snoring. RR even and unlabored. Color good.

## 2020-02-11 NOTE — Progress Notes (Signed)
Awakened from sleep for VS/Assessment and medication. Pt. Denies pain and denies Nausea. RR was 20 at rest but 25 after assistance up to Bathroom. Voided 500cc of amber urine. Gait was steady, slow. Denies c/o-will cont to follow closely.

## 2020-02-11 NOTE — Progress Notes (Signed)
Discharge instructions complete and prescriptions sent to the pharmacy by the provider. Patient verbalizes understanding of teaching. Patient discharged home via wheelchair at 1545.

## 2020-02-11 NOTE — Progress Notes (Signed)
Pt. Has appeared to rest well most of the Night. She has had stated relief of LUQ Abd. Pain with Oxycodone as per PRN order. She denies Nausea and has voided 1200cc of Amber Urine this Shift. Needs encouragement with P.O. intake. She is ambulating with steady gait and Stand By assist. She has poor Pulmonary Toilet and requires encouragement with I.S. ?  Bilat. [place] with diminished breath Sounds; Pt. Will not take deep Breath because this causes her LUQ Abd. Pain. FHT WNL and Pt. States she feels a "Fluttering" in abd. At Intervals. She does not have any vaginal bleeding and/or discharge. Weight was   122.7kg this A.M.; weight was 122.1 kg on 02/10/20. Pt. RR has ranged 21-25 this shift. Prior to ambulation with last void RR was 20, after ambulation to BR RR increased to 25. Pt. Denied c/o at that time. She appears comfortable and in NAD at this time and Pt. States she feels, "Better."

## 2020-02-12 ENCOUNTER — Encounter: Payer: Self-pay | Admitting: Obstetrics and Gynecology

## 2020-02-12 ENCOUNTER — Telehealth: Payer: Self-pay | Admitting: Obstetrics & Gynecology

## 2020-02-12 NOTE — Telephone Encounter (Signed)
-----   Message from [name], MD sent at 02/11/2020 10:42 AM EDT ----- Regarding: Appt, ER/[facility] f/u w any OB MD for Monday please

## 2020-02-12 NOTE — Telephone Encounter (Signed)
Voicemail box is full unable to leave message for patient to call back to be schedule 

## 2020-02-12 NOTE — Telephone Encounter (Signed)
Patient is schedule for 02/12/20 with SDJ

## 2020-02-13 LAB — CMV DNA, QUANTITATIVE, PCR
CMV DNA Quant: NEGATIVE IU/mL
Log10 CMV Qn DNA Pl: UNDETERMINED log10 IU/mL

## 2020-02-14 LAB — HSV DNA BY PCR (REFERENCE LAB)
HSV 1 DNA: NEGATIVE
HSV 2 DNA: NEGATIVE

## 2020-02-20 ENCOUNTER — Other Ambulatory Visit: Payer: Self-pay | Admitting: Primary Care

## 2020-02-20 DIAGNOSIS — Z3402 Encounter for supervision of normal first pregnancy, second trimester: Secondary | ICD-10-CM

## 2020-02-26 ENCOUNTER — Telehealth: Payer: Self-pay | Admitting: Primary Care

## 2020-02-26 NOTE — Telephone Encounter (Signed)
02/26/20~Called [facility] to request CORRECTED Test Order to reflect US OB Follow Up, per Radiology. Phone rings constantly. Also called patient to rschd/lm on hm  vm. Mobile vm n/a. MF

## 2020-02-27 ENCOUNTER — Ambulatory Visit: Payer: Medicaid Other

## 2020-02-28 ENCOUNTER — Emergency Department: Payer: Medicaid Other

## 2020-02-28 ENCOUNTER — Emergency Department
Admission: EM | Admit: 2020-02-28 | Discharge: 2020-02-28 | Disposition: A | Discharge status: Home / Self Care | Payer: Medicaid Other | Attending: Emergency Medicine | Admitting: Emergency Medicine

## 2020-02-28 ENCOUNTER — Other Ambulatory Visit: Payer: Self-pay

## 2020-02-28 ENCOUNTER — Encounter: Payer: Self-pay | Admitting: *Deleted

## 2020-02-28 DIAGNOSIS — O99412 Diseases of the circulatory system complicating pregnancy, second trimester: Secondary | ICD-10-CM | POA: Insufficient documentation

## 2020-02-28 DIAGNOSIS — R0602 Shortness of breath: Secondary | ICD-10-CM | POA: Insufficient documentation

## 2020-02-28 DIAGNOSIS — R079 Chest pain, unspecified: Secondary | ICD-10-CM | POA: Diagnosis not present

## 2020-02-28 DIAGNOSIS — Z5321 Procedure and treatment not carried out due to patient leaving prior to being seen by health care provider: Secondary | ICD-10-CM | POA: Insufficient documentation

## 2020-02-28 DIAGNOSIS — Z3A19 19 weeks gestation of pregnancy: Secondary | ICD-10-CM | POA: Insufficient documentation

## 2020-02-28 DIAGNOSIS — O98212 Gonorrhea complicating pregnancy, second trimester: Secondary | ICD-10-CM | POA: Diagnosis not present

## 2020-02-28 LAB — BASIC METABOLIC PANEL
Anion gap: 11 (ref 5–15)
BUN: <5 mg/dL — ABNORMAL LOW (ref 6–20)
CO2: 18 mmol/L — ABNORMAL LOW (ref 22–32)
Calcium: 9.7 mg/dL (ref 8.9–10.3)
Chloride: 105 mmol/L (ref 98–111)
Creatinine, Ser: 0.53 mg/dL (ref 0.44–1.00)
GFR calc Af Amer: >60 mL/min (ref >60)
GFR calc non Af Amer: >60 mL/min (ref >60)
Glucose, Bld: 111 mg/dL — ABNORMAL HIGH (ref 70–99)
Potassium: 2.8 mmol/L — ABNORMAL LOW (ref 3.5–5.1)
Sodium: 134 mmol/L — ABNORMAL LOW (ref 135–145)

## 2020-02-28 LAB — TROPONIN I (HIGH SENSITIVITY)
Troponin I (High Sensitivity): 3 ng/L (ref <18)
Troponin I (High Sensitivity): 2 ng/L (ref <18)

## 2020-02-28 LAB — CBC
HCT: 34.3 % — ABNORMAL LOW (ref 36.0–46.0)
Hemoglobin: 11.8 g/dL — ABNORMAL LOW (ref 12.0–15.0)
MCH: 30.2 pg (ref 26.0–34.0)
MCHC: 34.4 g/dL (ref 30.0–36.0)
MCV: 87.7 fL (ref 80.0–100.0)
Platelets: 383 10*3/uL (ref 150–400)
RBC: 3.91 MIL/uL (ref 3.87–5.11)
RDW: 13.6 % (ref 11.5–15.5)
WBC: 9.5 10*3/uL (ref 4.0–10.5)
nRBC: 0 % (ref 0.0–0.2)

## 2020-02-28 LAB — HCG, QUANTITATIVE, PREGNANCY: hCG, Beta Chain, Quant, S: 7154 m[IU]/mL — ABNORMAL HIGH (ref <5)

## 2020-02-28 LAB — LIPASE, BLOOD: Lipase: 23 U/L (ref 11–51)

## 2020-02-28 MED ORDER — SODIUM CHLORIDE 0.9% FLUSH: 3.0000 mL | Freq: Once | INTRAVENOUS | Status: DC

## 2020-02-28 NOTE — ED Triage Notes (Addendum)
Pt ambulatory to triage.  Pt reports chest pain and sob.  Pt is approx [redacted] weeks pregnant.  No vag bleeding or urinary sx.  Treated at [facility].  Pt reports dx with gonorrhea yesterday.  No meds yet.  Pt reports chest pain and sob since this am. Pt tachypneic in triage. Pt alert  Speech clear.  Pt also reports gallbladder problems and was unable to have surgery due to pregnancy.

## 2020-02-28 NOTE — ED Notes (Signed)
Per dr Mayford Knife, do cxr.

## 2020-03-08 ENCOUNTER — Emergency Department
Admission: EM | Admit: 2020-03-08 | Discharge: 2020-03-08 | Disposition: A | Discharge status: Home / Self Care | Payer: Medicaid Other | Attending: Emergency Medicine | Admitting: Emergency Medicine

## 2020-03-08 ENCOUNTER — Other Ambulatory Visit: Payer: Self-pay

## 2020-03-08 ENCOUNTER — Encounter: Payer: Self-pay | Admitting: Emergency Medicine

## 2020-03-08 ENCOUNTER — Emergency Department: Payer: Medicaid Other

## 2020-03-08 DIAGNOSIS — Z3A00 Weeks of gestation of pregnancy not specified: Secondary | ICD-10-CM | POA: Diagnosis not present

## 2020-03-08 DIAGNOSIS — Z79899 Other long term (current) drug therapy: Secondary | ICD-10-CM | POA: Insufficient documentation

## 2020-03-08 DIAGNOSIS — Z20822 Contact with and (suspected) exposure to covid-19: Secondary | ICD-10-CM | POA: Insufficient documentation

## 2020-03-08 DIAGNOSIS — O219 Vomiting of pregnancy, unspecified: Secondary | ICD-10-CM | POA: Insufficient documentation

## 2020-03-08 DIAGNOSIS — O26892 Other specified pregnancy related conditions, second trimester: Secondary | ICD-10-CM | POA: Diagnosis present

## 2020-03-08 DIAGNOSIS — R0789 Other chest pain: Secondary | ICD-10-CM | POA: Diagnosis not present

## 2020-03-08 DIAGNOSIS — R071 Chest pain on breathing: Secondary | ICD-10-CM | POA: Diagnosis not present

## 2020-03-08 DIAGNOSIS — R112 Nausea with vomiting, unspecified: Secondary | ICD-10-CM

## 2020-03-08 LAB — CBC
HCT: 31.7 % — ABNORMAL LOW (ref 36.0–46.0)
Hemoglobin: 11.1 g/dL — ABNORMAL LOW (ref 12.0–15.0)
MCH: 30.3 pg (ref 26.0–34.0)
MCHC: 35 g/dL (ref 30.0–36.0)
MCV: 86.6 fL (ref 80.0–100.0)
Platelets: 398 10*3/uL (ref 150–400)
RBC: 3.66 MIL/uL — ABNORMAL LOW (ref 3.87–5.11)
RDW: 13.3 % (ref 11.5–15.5)
WBC: 9.6 10*3/uL (ref 4.0–10.5)
nRBC: 0 % (ref 0.0–0.2)

## 2020-03-08 LAB — HEPATIC FUNCTION PANEL
ALT: 116 U/L — ABNORMAL HIGH (ref 0–44)
AST: 108 U/L — ABNORMAL HIGH (ref 15–41)
Albumin: 3.4 g/dL — ABNORMAL LOW (ref 3.5–5.0)
Alkaline Phosphatase: 66 U/L (ref 38–126)
Bilirubin, Direct: 0.2 mg/dL (ref 0.0–0.2)
Indirect Bilirubin: 0.5 mg/dL (ref 0.3–0.9)
Total Bilirubin: 0.7 mg/dL (ref 0.3–1.2)
Total Protein: 7 g/dL (ref 6.5–8.1)

## 2020-03-08 LAB — BASIC METABOLIC PANEL
Anion gap: 15 (ref 5–15)
BUN: 5 mg/dL — ABNORMAL LOW (ref 6–20)
CO2: 15 mmol/L — ABNORMAL LOW (ref 22–32)
Calcium: 9.7 mg/dL (ref 8.9–10.3)
Chloride: 106 mmol/L (ref 98–111)
Creatinine, Ser: 0.56 mg/dL (ref 0.44–1.00)
GFR calc Af Amer: >60 mL/min (ref >60)
GFR calc non Af Amer: >60 mL/min (ref >60)
Glucose, Bld: 102 mg/dL — ABNORMAL HIGH (ref 70–99)
Potassium: 3.1 mmol/L — ABNORMAL LOW (ref 3.5–5.1)
Sodium: 136 mmol/L (ref 135–145)

## 2020-03-08 LAB — RESPIRATORY PANEL BY RT PCR (FLU A&B, COVID)
Influenza A by PCR: NEGATIVE
Influenza B by PCR: NEGATIVE
SARS Coronavirus 2 by RT PCR: NEGATIVE

## 2020-03-08 LAB — TROPONIN I (HIGH SENSITIVITY)
Troponin I (High Sensitivity): 4 ng/L (ref <18)
Troponin I (High Sensitivity): 2 ng/L (ref <18)

## 2020-03-08 LAB — FIBRIN DERIVATIVES D-DIMER (ARMC ONLY): Fibrin derivatives D-dimer (ARMC): 1716.42 ng/mL (FEU) — ABNORMAL HIGH (ref 0.00–499.00)

## 2020-03-08 LAB — LIPASE, BLOOD: Lipase: 19 U/L (ref 11–51)

## 2020-03-08 MED ORDER — FAMOTIDINE 40 MG PO TABS
40.0000 mg | ORAL_TABLET | Freq: Every day | ORAL | 0 refills | Status: DC
Start: 2020-03-08 — End: 2021-01-23

## 2020-03-08 MED ORDER — MORPHINE SULFATE (PF) 4 MG/ML IV SOLN
4.0000 mg | Freq: Once | INTRAVENOUS | Status: AC
  Administered 2020-03-08: 19:00:00 4 mg via INTRAVENOUS
  Filled 2020-03-08: qty 1

## 2020-03-08 MED ORDER — ONDANSETRON HCL 4 MG/2ML IJ SOLN
4.0000 mg | Freq: Once | INTRAMUSCULAR | Status: AC
  Administered 2020-03-08: 15:00:00 4 mg via INTRAVENOUS
  Filled 2020-03-08: qty 2

## 2020-03-08 MED ORDER — DOXYLAMINE-PYRIDOXINE 10-10 MG PO TBEC
DELAYED_RELEASE_TABLET | ORAL | 0 refills | Status: DC
Start: 2020-03-08 — End: 2020-07-09

## 2020-03-08 MED ORDER — LACTATED RINGERS IV BOLUS
1000.0000 mL | Freq: Once | INTRAVENOUS | Status: AC
  Administered 2020-03-08: 15:00:00 1000 mL via INTRAVENOUS

## 2020-03-08 MED ORDER — ONDANSETRON 4 MG PO TBDP: 4.0000 mg | ORAL_TABLET | Freq: Four times a day (QID) | ORAL | 0 refills | Status: DC | PRN

## 2020-03-08 MED ORDER — SODIUM CHLORIDE 0.9% FLUSH: 3.0000 mL | Freq: Once | INTRAVENOUS | Status: DC

## 2020-03-08 MED ORDER — FENTANYL CITRATE (PF) 100 MCG/2ML IJ SOLN
50.0000 ug | Freq: Once | INTRAMUSCULAR | Status: AC
  Administered 2020-03-08: 50 ug via INTRAVENOUS
  Filled 2020-03-08: qty 2

## 2020-03-08 MED ORDER — LIDOCAINE VISCOUS HCL 2 % MT SOLN
15.0000 mL | Freq: Once | OROMUCOSAL | Status: AC
  Administered 2020-03-08: 15:00:00 15 mL via ORAL
  Filled 2020-03-08: qty 15

## 2020-03-08 MED ORDER — IOHEXOL 350 MG/ML SOLN
75.0000 mL | Freq: Once | INTRAVENOUS | Status: AC | PRN
  Administered 2020-03-08: 18:00:00 75 mL via INTRAVENOUS

## 2020-03-08 MED ORDER — ALUM & MAG HYDROXIDE-SIMETH 200-200-20 MG/5ML PO SUSP
30.0000 mL | Freq: Once | ORAL | Status: AC
  Administered 2020-03-08: 30 mL via ORAL
  Filled 2020-03-08: qty 30

## 2020-03-08 MED ORDER — DOXYLAMINE-PYRIDOXINE 10-10 MG PO TBEC
DELAYED_RELEASE_TABLET | ORAL | 0 refills | Status: DC
Start: 2020-03-08 — End: 2020-03-08

## 2020-03-08 MED ORDER — PROMETHAZINE HCL 25 MG/ML IJ SOLN
12.5000 mg | Freq: Once | INTRAMUSCULAR | Status: DC
  Filled 2020-03-08: qty 1

## 2020-03-08 NOTE — ED Triage Notes (Signed)
Pt to ED via [facility] from store for severe chest pain and tachycardia. Pt is c/o 10/10 chest pain. Pt is 3 days post op from gallbladder surgery. Pt is 5 months pregnant. Pt is currently hyperventilating, unable to be calmed down.

## 2020-03-08 NOTE — ED Provider Notes (Addendum)
[facility] Emergency Department Provider Note  ____________________________________________   First MD Initiated Contact with Patient 03/08/20 1417     (approximate)  I have reviewed the triage vital signs and the nursing notes.   HISTORY  Chief Complaint Chest Pain    HPI Joanne Pacheco is a 19 y.o. female  Currently 5 mo pregnant here with chest pain. Pt reports she was at the grocery today when she experienced acute onset of sharp, stabbing, severe pain. Reports it is worse with deep inspiration. She also reports that she has had persistent nausea, vomiting throughout pregnancy and did vomit prior to this onset of pain this morning. No blood in emesis. Of note, she just had a cholecystectomy 4 days at [facility] for abd pain. No dysuria, diarrhea. No alleviating factors.    Past Medical History:  Diagnosis Date  . Hyperemesis 02/08/2020  . Obesity     Patient Active Problem List   Diagnosis Date Noted  . Hematemesis 02/09/2020  . Hypokalemia 02/09/2020  . Tachypnea 02/09/2020  . Abnormal LFTs 02/09/2020  . Pregnancy-16 weeks 02/09/2020  . Pleuritic chest pain 02/09/2020  . Obesity   . Epigastric pain   . Hyperemesis 02/08/2020    History reviewed. No pertinent surgical history.  Prior to Admission medications   Medication Sig Start Date End Date Taking? Authorizing Provider  Doxylamine-Pyridoxine (DICLEGIS) 10-10 MG TBEC Take two tablets at bedtime on day 1 and 2; if symptoms persist, take 1 tab in AM and 2 tabs at bedtime day 3, then 1 tab every 6 hr PRN 03/08/20   Duffy Bruce, MD  famotidine (PEPCID) 40 MG tablet Take 1 tablet (40 mg total) by mouth daily for 7 days. 03/08/20 03/15/20  Duffy Bruce, MD  ondansetron (ZOFRAN ODT) 4 MG disintegrating tablet Take 1 tablet (4 mg total) by mouth every 6 (six) hours as needed for refractory nausea / vomiting. 03/08/20   Duffy Bruce, MD  potassium chloride SA (KLOR-CON) 20 MEQ tablet Take 2  tablets (40 mEq total) by mouth 2 (two) times daily. 02/11/20   Gae Dry, MD  predniSONE (DELTASONE) 10 MG tablet Take 1 tablet (10 mg total) by mouth daily with breakfast. For 5 more days 02/12/20   Gae Dry, MD  Prenatal Vit-Fe Fumarate-FA (PRENATAL MULTIVITAMIN) TABS tablet Take 1 tablet by mouth daily at 12 noon.    [provider]  pyridOXINE (VITAMIN B-6) 25 MG tablet Take 1 tablet (25 mg total) by mouth 2 (two) times daily. Helps with nausea 02/11/20   Gae Dry, MD    Allergies Patient has no known allergies.  No family history on file.  Social History Social History   Tobacco Use  . Smoking status: Never Smoker  . Smokeless tobacco: Never Used  Substance Use Topics  . Alcohol use: Never  . Drug use: Never    Review of Systems  Review of Systems  Constitutional: Negative for fatigue and fever.  HENT: Negative for congestion and sore throat.   Eyes: Negative for visual disturbance.  Respiratory: Positive for chest tightness. Negative for cough and shortness of breath.   Cardiovascular: Positive for chest pain.  Gastrointestinal: Positive for nausea and vomiting. Negative for abdominal pain and diarrhea.  Genitourinary: Negative for flank pain.  Musculoskeletal: Negative for back pain and neck pain.  Skin: Negative for rash and wound.  Neurological: Negative for weakness.  All other systems reviewed and are negative.    ____________________________________________  PHYSICAL EXAM:  VITAL SIGNS: ED Triage Vitals  Enc Vitals Group     BP --      Pulse Rate 03/08/20 1419 (!) 102     Resp 03/08/20 1419 (!) 45     Temp --      Temp Source 03/08/20 1419 Oral     SpO2 03/08/20 1419 100 %     Weight --      Height --      Head Circumference --      Peak Flow --      Pain Score 03/08/20 1420 10     Pain Loc --      Pain Edu? --      Excl. in GC? --      Physical Exam Vitals and nursing note reviewed.  Constitutional:       General: She is not in acute distress.    Appearance: She is well-developed.     Comments: Crying, tearful, anxious  HENT:     Head: Normocephalic and atraumatic.  Eyes:     Conjunctiva/sclera: Conjunctivae normal.  Cardiovascular:     Rate and Rhythm: Regular rhythm. Tachycardia present.     Heart sounds: Normal heart sounds. No murmur. No friction rub.  Pulmonary:     Effort: Pulmonary effort is normal. Tachypnea present. No respiratory distress.     Breath sounds: Normal breath sounds. No wheezing or rales.  Abdominal:     General: There is no distension.     Palpations: Abdomen is soft.     Tenderness: There is no abdominal tenderness.  Musculoskeletal:     Cervical back: Neck supple.  Skin:    General: Skin is warm.     Capillary Refill: Capillary refill takes less than 2 seconds.     Findings: No rash.  Neurological:     Mental Status: She is alert and oriented to person, place, and time.     Motor: No abnormal muscle tone.       ____________________________________________   LABS (all labs ordered are listed, but only abnormal results are displayed)  Labs Reviewed  BASIC METABOLIC PANEL - Abnormal; Notable for the following components:      Result Value   Potassium 3.1 (*)    CO2 15 (*)    Glucose, Bld 102 (*)    BUN 5 (*)    All other components within normal limits  CBC - Abnormal; Notable for the following components:   RBC 3.66 (*)    Hemoglobin 11.1 (*)    HCT 31.7 (*)    All other components within normal limits  HEPATIC FUNCTION PANEL - Abnormal; Notable for the following components:   Albumin 3.4 (*)    AST 108 (*)    ALT 116 (*)    All other components within normal limits  FIBRIN DERIVATIVES D-DIMER ([facility] ONLY) - Abnormal; Notable for the following components:   Fibrin derivatives D-dimer [facility]) 3,976.73 (*)    All other components within normal limits  RESPIRATORY PANEL BY RT PCR (FLU A&B, COVID)  LIPASE, BLOOD  TROPONIN I (HIGH SENSITIVITY)   TROPONIN I (HIGH SENSITIVITY)    ____________________________________________  EKG: Sinus tachycardia, VR 114. QRS 109, QTc 519 on recording though <450 on my calculation. TWI noted. No acute ST elevations or depressions. ________________________________________  RADIOLOGY All imaging, including plain films, CT scans, and ultrasounds, independently reviewed by me, and interpretations confirmed via formal radiology reads.  ED MD interpretation:   DVT LE: Negative CXR: Clear CT Angio:  Neg for PE  Official radiology report(s): CT Angio Chest PE W and/or Wo Contrast  Result Date: 03/08/2020 CLINICAL DATA:  Shortness of breath, 3 days postoperative from gallbladder surgery, 5 months pregnant EXAM: CT ANGIOGRAPHY CHEST WITH CONTRAST TECHNIQUE: Multidetector CT imaging of the chest was performed using the standard protocol during bolus administration of intravenous contrast. Multiplanar CT image reconstructions and MIPs were obtained to evaluate the vascular anatomy. CONTRAST:  75mL OMNIPAQUE IOHEXOL 350 MG/ML SOLN COMPARISON:  CT angiography 02/08/2020 FINDINGS: Cardiovascular: Satisfactory opacification the pulmonary arteries to the segmental level. No pulmonary artery filling defects are identified. Central pulmonary arteries are normal caliber. Cardiac size at the upper limits of normal. No pericardial effusion. The aortic root is suboptimally assessed given cardiac pulsation artifact. Normal caliber thoracic aorta. No acute luminal or periaortic abnormality. Normal 3 vessel branching of the aortic arch. Proximal great vessels are unremarkable. 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 Normal thyroid gland. No acute abnormality of the trachea or esophagus. No worrisome mediastinal, hilar or axillary adenopathy. Lungs/Pleura: No consolidation, features of edema, pneumothorax, or effusion. Bandlike areas of probable atelectasis in the lung bases.  No suspicious pulmonary nodules or masses. Upper Abdomen: No acute abnormalities present in the visualized portions of the upper abdomen. Musculoskeletal: No chest wall mass or suspicious bone lesions identified. Review of the MIP images confirms the above findings. IMPRESSION: 1. No evidence of pulmonary embolism. 2. No acute intrathoracic process. 3. 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 Electronically Signed   By: Kreg ShropshirePrice  DeHay M.D.   On: 03/08/2020 18:29   US Venous Img Lower Bilateral  Result Date: 03/08/2020 CLINICAL DATA:  Recent surgery, severe chest pain and tachycardia began 3 days prior, currently 5 months pregnant EXAM: BILATERAL LOWER EXTREMITY VENOUS DOPPLER ULTRASOUND TECHNIQUE: Gray-scale sonography with compression, as well as color and duplex ultrasound, were performed to evaluate the deep venous system(s) from the level of the common femoral vein through the popliteal and proximal calf veins. COMPARISON:  None. FINDINGS: VENOUS Normal compressibility of the common femoral, superficial femoral, and popliteal veins, as well as the visualized calf veins. Visualized portions of profunda femoral vein and great saphenous vein unremarkable. No filling defects to suggest DVT on grayscale or color Doppler imaging. Doppler waveforms show normal direction of venous flow, normal respiratory phasicity and response to augmentation. OTHER None. Limitations: none IMPRESSION: No femoropopliteal DVT nor evidence of DVT within the visualized calf veins of either lower extremity. If clinical symptoms are inconsistent or if there are persistent or worsening symptoms, further imaging (possibly involving the iliac veins) may be warranted. Electronically Signed   By: Kreg ShropshirePrice  DeHay M.D.   On: 03/08/2020 17:04   DG Chest Portable 1 View  Result Date: 03/08/2020 CLINICAL DATA:  Chest pain and tachycardia. Three days postop cholecystectomy. Patient is 5 months pregnant. EXAM:  PORTABLE CHEST 1 VIEW COMPARISON:  Radiograph 02/28/2020. Chest CT 02/08/2020 FINDINGS: Low lung volumes. Heart is normal in size with normal mediastinal contours. No focal airspace disease, pneumothorax, pulmonary edema, or large pleural effusion. No acute osseous abnormalities are seen. IMPRESSION: Low lung volumes without acute findings. Electronically Signed   By: Narda RutherfordMelanie  Sanford M.D.   On: 03/08/2020 15:09    ____________________________________________  PROCEDURES   Procedure(s) performed (including Critical Care):  .1-3 Lead EKG Interpretation Performed by: Shaune PollackIsaacs, Britainy Kozub, MD Authorized by: Shaune PollackIsaacs, Caulder Wehner, MD     Interpretation: normal     ECG rate assessment: normal  Rhythm: sinus rhythm     Ectopy: none     Conduction: normal   Comments:     Indication: Chest pain    ____________________________________________  INITIAL IMPRESSION / MDM / ASSESSMENT AND PLAN / ED COURSE  As part of my medical decision making, I reviewed the following data within the electronic MEDICAL RECORD NUMBER Nursing notes reviewed and incorporated, Old chart reviewed, Notes from prior ED visits, and [place] Controlled Substance Database       *Joanne Pacheco was evaluated in Emergency Department on 03/08/2020 for the symptoms described in the history of present illness. She was evaluated in the context of the global COVID-19 pandemic, which necessitated consideration that the patient might be at risk for infection with the SARS-CoV-2 virus that causes COVID-19. Institutional protocols and algorithms that pertain to the evaluation of patients at risk for COVID-19 are in a state of rapid change based on information released by regulatory bodies including the CDC and federal and state organizations. These policies and algorithms were followed during the patient's care in the ED.  Some ED evaluations and interventions may be delayed as a result of limited staffing during the pandemic.*     Medical  Decision Making:  19 yo G1P0, currently 5 mo pregnant, here with acute onset chest pain. Pt tachycardic, tachypneic on arrival in mod distress. I reviewed her prior records, ED visits. While she has had recurrent CP complaints in the past, she is adamant that her current CP is different. Moreover, she is s/p recent surgery, placing her at even higher risk of PE. D-Dimer, DVT studies ordered for possible risk stratification. While DVT studies negative, D-Dimer significantly above her prior value. Discussed with radiology. Given +D-Dimer above her prior value with pt adamant she has different pain, I had a long discussion with her re: risks of radiation vs risks of PE. Also discussed with radiology Dr. Mayford Knife who recommends CT rather than V/Q. Minimal radiation risk to baby with shielding.   Based on shared decision making, CT Angio obtained and is fortunately negative. I suspect her pain is multifactorial 2/2 her vomiting/gastritis, possibly with component of anxiety. Would recommend symptomatic management in future to hopefully avoid additional imaging.   ____________________________________________  FINAL CLINICAL IMPRESSION(S) / ED DIAGNOSES  Final diagnoses:  Non-intractable vomiting with nausea, unspecified vomiting type  Atypical chest pain     MEDICATIONS GIVEN DURING THIS VISIT:  Medications  sodium chloride flush (NS) 0.9 % injection 3 mL (3 mLs Intravenous Not Given 03/08/20 1446)  promethazine (PHENERGAN) injection 12.5 mg (0 mg Intravenous Hold 03/08/20 1833)  fentaNYL (SUBLIMAZE) injection 50 mcg (50 mcg Intravenous Given 03/08/20 1431)  ondansetron (ZOFRAN) injection 4 mg (4 mg Intravenous Given 03/08/20 1437)  lactated ringers bolus 1,000 mL (0 mLs Intravenous Stopped 03/08/20 1834)  alum & mag hydroxide-simeth (MAALOX/MYLANTA) 200-200-20 MG/5ML suspension 30 mL (30 mLs Oral Given 03/08/20 1510)    And  lidocaine (XYLOCAINE) 2 % viscous mouth solution 15 mL (15 mLs Oral Given  03/08/20 1510)  morphine 4 MG/ML injection 4 mg (4 mg Intravenous Given 03/08/20 1833)  iohexol (OMNIPAQUE) 350 MG/ML injection 75 mL (75 mLs Intravenous Contrast Given 03/08/20 1755)     ED Discharge Orders         Ordered    Doxylamine-Pyridoxine (DICLEGIS) 10-10 MG TBEC  Status:  Discontinued     03/08/20 1950    famotidine (PEPCID) 40 MG tablet  Daily     03/08/20 1952    ondansetron (ZOFRAN  ODT) 4 MG disintegrating tablet  Every 6 hours PRN     03/08/20 1952    Doxylamine-Pyridoxine (DICLEGIS) 10-10 MG TBEC     03/08/20 2010           Note:  This document was prepared using Dragon voice recognition software and may include unintentional dictation errors.   Shaune Pollack, MD 03/08/20 Dia Sitter    Shaune Pollack, MD 03/08/20 2241

## 2020-03-08 NOTE — ED Notes (Signed)
PT given water.

## 2020-03-08 NOTE — ED Notes (Signed)
Pt visualized resting in bed with eyes closed, when speaking with staff patient noted to become tearful, c/o continued pain 6/10. Call bell within reach. VSS at this time.

## 2020-03-08 NOTE — ED Notes (Signed)
Per pt pain is reproducible on palpation. Pt states that she did have some minor pain this morning when she woke up but thought it was heart burn. Pt [place] currently sleeping on couch since her surgery. Pt reports 1 episode of vomiting this morning. Pt states that yesterday every time she ate or drink anything it came back up.

## 2020-07-09 ENCOUNTER — Other Ambulatory Visit: Payer: Self-pay

## 2020-07-09 ENCOUNTER — Emergency Department: Payer: Medicaid Other

## 2020-07-09 ENCOUNTER — Emergency Department
Admission: EM | Admit: 2020-07-09 | Discharge: 2020-07-10 | Disposition: A | Discharge status: Home / Self Care | Payer: Medicaid Other | Attending: Emergency Medicine | Admitting: Emergency Medicine

## 2020-07-09 DIAGNOSIS — U071 COVID-19: Secondary | ICD-10-CM | POA: Diagnosis not present

## 2020-07-09 DIAGNOSIS — Z3A38 38 weeks gestation of pregnancy: Secondary | ICD-10-CM

## 2020-07-09 DIAGNOSIS — O219 Vomiting of pregnancy, unspecified: Secondary | ICD-10-CM | POA: Diagnosis present

## 2020-07-09 DIAGNOSIS — E876 Hypokalemia: Secondary | ICD-10-CM | POA: Diagnosis not present

## 2020-07-09 DIAGNOSIS — R8271 Bacteriuria: Secondary | ICD-10-CM

## 2020-07-09 DIAGNOSIS — E860 Dehydration: Secondary | ICD-10-CM | POA: Diagnosis not present

## 2020-07-09 DIAGNOSIS — R112 Nausea with vomiting, unspecified: Secondary | ICD-10-CM

## 2020-07-09 DIAGNOSIS — O98513 Other viral diseases complicating pregnancy, third trimester: Secondary | ICD-10-CM | POA: Diagnosis not present

## 2020-07-09 LAB — BASIC METABOLIC PANEL WITH GFR
Anion gap: 10 (ref 5–15)
BUN: <5 mg/dL — ABNORMAL LOW (ref 6–20)
CO2: 21 mmol/L — ABNORMAL LOW (ref 22–32)
Calcium: 9.1 mg/dL (ref 8.9–10.3)
Chloride: 104 mmol/L (ref 98–111)
Creatinine, Ser: 0.37 mg/dL — ABNORMAL LOW (ref 0.44–1.00)
GFR calc Af Amer: >60 mL/min
GFR calc non Af Amer: >60 mL/min
Glucose, Bld: 102 mg/dL — ABNORMAL HIGH (ref 70–99)
Potassium: 3 mmol/L — ABNORMAL LOW (ref 3.5–5.1)
Sodium: 135 mmol/L (ref 135–145)

## 2020-07-09 LAB — URINALYSIS, COMPLETE (UACMP) WITH MICROSCOPIC
Bilirubin Urine: NEGATIVE
Glucose, UA: NEGATIVE mg/dL
Hgb urine dipstick: NEGATIVE
Ketones, ur: NEGATIVE mg/dL
Nitrite: NEGATIVE
Protein, ur: NEGATIVE mg/dL
Specific Gravity, Urine: 1.009 (ref 1.005–1.030)
pH: 6 (ref 5.0–8.0)

## 2020-07-09 LAB — TROPONIN I (HIGH SENSITIVITY): Troponin I (High Sensitivity): 4 ng/L (ref <18)

## 2020-07-09 LAB — HEPATIC FUNCTION PANEL
ALT: 31 U/L (ref 0–44)
AST: 53 U/L — ABNORMAL HIGH (ref 15–41)
Albumin: 3.3 g/dL — ABNORMAL LOW (ref 3.5–5.0)
Alkaline Phosphatase: 163 U/L — ABNORMAL HIGH (ref 38–126)
Bilirubin, Direct: 0.1 mg/dL (ref 0.0–0.2)
Indirect Bilirubin: 0.9 mg/dL (ref 0.3–0.9)
Total Bilirubin: 1 mg/dL (ref 0.3–1.2)
Total Protein: 7.5 g/dL (ref 6.5–8.1)

## 2020-07-09 LAB — CBC
HCT: 30.2 % — ABNORMAL LOW (ref 36.0–46.0)
Hemoglobin: 10.2 g/dL — ABNORMAL LOW (ref 12.0–15.0)
MCH: 29.2 pg (ref 26.0–34.0)
MCHC: 33.8 g/dL (ref 30.0–36.0)
MCV: 86.5 fL (ref 80.0–100.0)
Platelets: 260 10*3/uL (ref 150–400)
RBC: 3.49 MIL/uL — ABNORMAL LOW (ref 3.87–5.11)
RDW: 14.2 % (ref 11.5–15.5)
WBC: 3.9 10*3/uL — ABNORMAL LOW (ref 4.0–10.5)
nRBC: 0 % (ref 0.0–0.2)

## 2020-07-09 LAB — MAGNESIUM: Magnesium: 2.1 mg/dL (ref 1.7–2.4)

## 2020-07-09 MED ORDER — PYRIDOXINE HCL 25 MG PO TABS: 25.0000 mg | ORAL_TABLET | Freq: Two times a day (BID) | ORAL | 2 refills | Status: DC

## 2020-07-09 MED ORDER — CEPHALEXIN 500 MG PO CAPS: 500.0000 mg | ORAL_CAPSULE | Freq: Four times a day (QID) | ORAL | 0 refills | Status: AC

## 2020-07-09 MED ORDER — POTASSIUM CHLORIDE CRYS ER 20 MEQ PO TBCR
40.0000 meq | EXTENDED_RELEASE_TABLET | Freq: Once | ORAL | Status: AC
  Administered 2020-07-09: 40 meq via ORAL
  Filled 2020-07-09: qty 2

## 2020-07-09 MED ORDER — DOXYLAMINE-PYRIDOXINE 10-10 MG PO TBEC: DELAYED_RELEASE_TABLET | ORAL | 0 refills | Status: DC

## 2020-07-09 MED ORDER — DOXYLAMINE SUCCINATE (SLEEP) 25 MG PO TABS
25.0000 mg | ORAL_TABLET | Freq: Once | ORAL | Status: AC
  Administered 2020-07-09: 25 mg via ORAL
  Filled 2020-07-09: qty 1

## 2020-07-09 MED ORDER — PYRIDOXINE HCL 100 MG/ML IJ SOLN
100.0000 mg | Freq: Once | INTRAMUSCULAR | Status: AC
  Administered 2020-07-09: 100 mg via INTRAVENOUS
  Filled 2020-07-09: qty 1

## 2020-07-09 MED ORDER — ACETAMINOPHEN 500 MG PO TABS
1000.0000 mg | ORAL_TABLET | Freq: Once | ORAL | Status: AC
  Administered 2020-07-09: 1000 mg via ORAL
  Filled 2020-07-09: qty 2

## 2020-07-09 MED ORDER — LACTATED RINGERS IV BOLUS
1000.0000 mL | Freq: Once | INTRAVENOUS | Status: AC
  Administered 2020-07-09: 1000 mL via INTRAVENOUS

## 2020-07-09 NOTE — ED Provider Notes (Signed)
[facility] Emergency Department Provider Note  ____________________________________________   First MD Initiated Contact with Patient 07/09/20 2111     (approximate)  I have reviewed the triage vital signs and the nursing notes.   HISTORY  Chief Complaint Weakness   HPI Joanne Pacheco is a 19 y.o. female approximately [redacted] weeks pregnant with thus far uncomplicated pregnancy per patient who is receiving routine prenatal care through clinic but does not have an OB/GYN yet as well history of obesity who presents for assessment of 3 to 4 days of nausea, vomiting, headache, myalgias, chills, fatigue, and poor p.o. intake.  Patient versus some chest tightness as well.  She endorses shortness of breath with exertion.  She denies any acute abdominal pain, vaginal bleeding, vaginal discharge, contractions, back pain, rash, focal extremity pain, hemoptysis, vertigo, ear pain, eye pain, or other acute complaints.  No partial episodes.  No clear alleviating aggravating factors.  Patient states she had a routine ultrasound done of her baby yesterday that was read as unremarkable.         Past Medical History:  Diagnosis Date  . Hyperemesis 02/08/2020  . Obesity     Patient Active Problem List   Diagnosis Date Noted  . Hematemesis 02/09/2020  . Hypokalemia 02/09/2020  . Tachypnea 02/09/2020  . Abnormal LFTs 02/09/2020  . Pregnancy-16 weeks 02/09/2020  . Pleuritic chest pain 02/09/2020  . Obesity   . Epigastric pain   . Hyperemesis 02/08/2020    No past surgical history on file.  Prior to Admission medications   Medication Sig Start Date End Date Taking? Authorizing Provider  cephALEXin (KEFLEX) 500 MG capsule Take 1 capsule (500 mg total) by mouth 4 (four) times daily for 7 days. 07/09/20 07/16/20  Gilles Chiquito, MD  Doxylamine-Pyridoxine (DICLEGIS) 10-10 MG TBEC Take two tablets at bedtime on day 1 and 2; if symptoms persist, take 1 tab in AM and 2 tabs at  bedtime day 3, then 1 tab every 6 hr PRN 07/09/20   Gilles Chiquito, MD  famotidine (PEPCID) 40 MG tablet Take 1 tablet (40 mg total) by mouth daily for 7 days. 03/08/20 03/15/20  Shaune Pollack, MD  potassium chloride SA (KLOR-CON) 20 MEQ tablet Take 2 tablets (40 mEq total) by mouth 2 (two) times daily. 02/11/20   [name], MD  Prenatal Vit-Fe Fumarate-FA (PRENATAL MULTIVITAMIN) TABS tablet Take 1 tablet by mouth daily at 12 noon.    [provider]  pyridOXINE (VITAMIN B-6) 25 MG tablet Take 1 tablet (25 mg total) by mouth 2 (two) times daily. Helps with nausea 07/09/20   Gilles Chiquito, MD    Allergies Patient has no known allergies.  No family history on file.  Social History Social History   Tobacco Use  . Smoking status: Never Smoker  . Smokeless tobacco: Never Used  Vaping Use  . Vaping Use: Never used  Substance Use Topics  . Alcohol use: Never  . Drug use: Never    Review of Systems  ROS    ____________________________________________   PHYSICAL EXAM:  VITAL SIGNS: ED Triage Vitals  Enc Vitals Group     BP 07/09/20 1826 112/65     Pulse Rate 07/09/20 1826 (!) 110     Resp 07/09/20 1826 16     Temp 07/09/20 1826 98.6 F (37 C)     Temp Source 07/09/20 1826 Oral     SpO2 07/09/20 1826 96 %     Weight  07/09/20 1828 263 lb (119.3 kg)     Height 07/09/20 1828 5\' 7"  (1.702 m)     Head Circumference --      Peak Flow --      Pain Score 07/09/20 1828 10     Pain Loc --      Pain Edu? --      Excl. in [place]? --    Vitals:   07/09/20 1826 07/09/20 2050  BP: 112/65 136/89  Pulse: (!) 110 (!) 108  Resp: 16 18  Temp: 98.6 F (37 C) 98.8 F (37.1 C)  SpO2: 96% 98%   Physical Exam Vitals and nursing note reviewed.  Constitutional:      General: She is not in acute distress.    Appearance: She is well-developed.  HENT:     Head: Normocephalic and atraumatic.     Right Ear: External ear normal.     Left Ear: External ear normal.     Nose:  Nose normal.     Mouth/Throat:     Mouth: Mucous membranes are dry.  Eyes:     Conjunctiva/sclera: Conjunctivae normal.  Cardiovascular:     Rate and Rhythm: Regular rhythm. Tachycardia present.     Pulses: Normal pulses.     Heart sounds: No murmur heard.   Pulmonary:     Effort: Pulmonary effort is normal. No respiratory distress.     Breath sounds: Normal breath sounds.  Abdominal:     General: There is distension.     Palpations: Abdomen is soft.     Tenderness: There is no abdominal tenderness. There is no right CVA tenderness or left CVA tenderness.  Musculoskeletal:     Cervical back: Neck supple. No rigidity.     Right lower leg: No edema.     Left lower leg: No edema.  Skin:    General: Skin is warm and dry.     Capillary Refill: Capillary refill takes 2 to 3 seconds.  Neurological:     Mental Status: She is alert and oriented to person, place, and time.  Psychiatric:        Mood and Affect: Mood normal.      ____________________________________________   LABS (all labs ordered are listed, but only abnormal results are displayed)  Labs Reviewed  BASIC METABOLIC PANEL - Abnormal; Notable for the following components:      Result Value   Potassium 3.0 (*)    CO2 21 (*)    Glucose, Bld 102 (*)    BUN <5 (*)    Creatinine, Ser 0.37 (*)    All other components within normal limits  CBC - Abnormal; Notable for the following components:   WBC 3.9 (*)    RBC 3.49 (*)    Hemoglobin 10.2 (*)    HCT 30.2 (*)    All other components within normal limits  URINALYSIS, COMPLETE (UACMP) WITH MICROSCOPIC - Abnormal; Notable for the following components:   Color, Urine AMBER (*)    APPearance HAZY (*)    Leukocytes,Ua MODERATE (*)    Bacteria, UA FEW (*)    All other components within normal limits  HEPATIC FUNCTION PANEL - Abnormal; Notable for the following components:   Albumin 3.3 (*)    AST 53 (*)    Alkaline Phosphatase 163 (*)    All other components within  normal limits  URINE CULTURE  MAGNESIUM  CBG MONITORING, ED  POC URINE PREG, ED  TROPONIN I (HIGH SENSITIVITY)   ____________________________________________  EKG  Sinus tachycardia with ventricular rate of 114, normal axis, unremarkable intervals, nonspecific T wave changes in the lateral inferior leads with no other clear evidence of acute ischemia or other significant underlying arrhythmia. ____________________________________________  RADIOLOGY  ED MD interpretation: Bilateral pneumonia likely viral.  Official radiology report(s): DG Chest 1 View  Result Date: 07/09/2020 CLINICAL DATA:  Pregnant patient in third trimester pregnancy. COVID positive. Chest tightness and tachycardia. EXAM: CHEST  1 VIEW COMPARISON:  Radiograph and CTA 03/08/2020 FINDINGS: Subsegmental opacity at the right lung base. Overall low lung volumes. Unchanged heart size and mediastinal contours. No pleural fluid or pneumothorax. No acute osseous abnormalities are seen. IMPRESSION: 1. Low lung volumes. 2. Subsegmental opacity at the right lung base, favor atelectasis, pneumonia could have a similar appearance in the setting of COVID 19. Electronically Signed   By: Narda RutherfordMelanie  Sanford M.D.   On: 07/09/2020 21:43   US Venous Img Lower Bilateral  Result Date: 07/09/2020 CLINICAL DATA:  Pregnant COVID positive chest tightness EXAM: Bilateral LOWER EXTREMITY VENOUS DOPPLER ULTRASOUND TECHNIQUE: Gray-scale sonography with compression, as well as color and duplex ultrasound, were performed to evaluate the deep venous system(s) from the level of the common femoral vein through the popliteal and proximal calf veins. COMPARISON:  None. FINDINGS: VENOUS Normal compressibility of the common femoral, superficial femoral, and popliteal veins, as well as the visualized calf veins. Visualized portions of profunda femoral vein and great saphenous vein unremarkable. No filling defects to suggest DVT on grayscale or color Doppler  imaging. Doppler waveforms show normal direction of venous flow, normal respiratory plasticity and response to augmentation. OTHER None. Limitations: none IMPRESSION: Negative. Electronically Signed   By: Jasmine PangKim  Fujinaga M.D.   On: 07/09/2020 23:42    ____________________________________________   PROCEDURES  Procedure(s) performed (including Critical Care):  Procedures   ____________________________________________   INITIAL IMPRESSION / ASSESSMENT AND PLAN / ED COURSE        Overall patient's history, exam, ED work-up is most consistent with COVID-19 pneumonia as well as mild dehydration and hypokalemia.  Do not believe patient's presentation is consistent with any acute pregnancy complication given she states she had an unremarkable ultrasound done yesterday although I am unable to view the study today.  Patient is slightly tachycardic with otherwise stable vital signs on arrival.  Exam as above.  Chest x-ray shows some findings consistent with Covid pneumonia.  Labs show some mild hypokalemia and leukocytosis and urine does show evidence of bacteria but otherwise not consistent with cystitis.  Lower extremity ultrasound shows no evidence of DVT and I have a low suspicion for PE at this time given patient is not tachypneic or hypoxic and her heart rate improved with IV fluids and she states she felt significantly better with Tylenol.  In addition EKG and troponin are not consistent with pericarditis or myocarditis I have low suspicion for ACS at this time given duration of symptoms and nonelevated troponin.  Patient discharged stable condition.  Strict return cautions advised and discussed.  Medications  lactated ringers bolus 1,000 mL (0 mLs Intravenous Stopped 07/09/20 2254)  potassium chloride SA (KLOR-CON) CR tablet 40 mEq (40 mEq Oral Given 07/09/20 2141)  pyridOXINE (B-6) injection 100 mg (100 mg Intravenous Given 07/09/20 2255)  doxylamine (Sleep) (UNISOM) tablet 25 mg (25 mg Oral  Given 07/09/20 2253)  acetaminophen (TYLENOL) tablet 1,000 mg (1,000 mg Oral Given 07/09/20 2253)   ____________________________________________   FINAL CLINICAL IMPRESSION(S) / ED DIAGNOSES  Final diagnoses:  COVID-19  [redacted] weeks gestation of pregnancy  Hypokalemia  Bacteriuria  Dehydration  Nausea and vomiting, intractability of vomiting not specified, unspecified vomiting type     ED Discharge Orders         Ordered    Doxylamine-Pyridoxine (DICLEGIS) 10-10 MG TBEC        07/09/20 2251    pyridOXINE (VITAMIN B-6) 25 MG tablet  2 times daily        07/09/20 2251    cephALEXin (KEFLEX) 500 MG capsule  4 times daily        07/09/20 2251           Note:  This document was prepared using Dragon voice recognition software and may include unintentional dictation errors.   Gilles Chiquito, MD 07/09/20 [phone]

## 2020-07-09 NOTE — ED Notes (Signed)
Pt has positive COVID test results in Care Everywhere from [facility]

## 2020-07-09 NOTE — ED Triage Notes (Signed)
Pt here with covid symptoms (weakness, SOB, and generalized pain). Pt is also [redacted] weeks pregnant. Pt NAD here in triage.

## 2020-07-10 ENCOUNTER — Other Ambulatory Visit: Payer: Self-pay

## 2020-07-10 ENCOUNTER — Emergency Department
Admission: EM | Admit: 2020-07-10 | Discharge: 2020-07-11 | Disposition: A | Discharge status: Home / Self Care | Payer: Medicaid Other | Source: Home / Self Care | Attending: Emergency Medicine | Admitting: Emergency Medicine

## 2020-07-10 DIAGNOSIS — O210 Mild hyperemesis gravidarum: Secondary | ICD-10-CM | POA: Insufficient documentation

## 2020-07-10 DIAGNOSIS — Z3A38 38 weeks gestation of pregnancy: Secondary | ICD-10-CM | POA: Insufficient documentation

## 2020-07-10 DIAGNOSIS — Z79899 Other long term (current) drug therapy: Secondary | ICD-10-CM | POA: Insufficient documentation

## 2020-07-10 DIAGNOSIS — U071 COVID-19: Secondary | ICD-10-CM | POA: Insufficient documentation

## 2020-07-10 DIAGNOSIS — O98513 Other viral diseases complicating pregnancy, third trimester: Secondary | ICD-10-CM | POA: Insufficient documentation

## 2020-07-10 LAB — HEPATIC FUNCTION PANEL
ALT: 28 U/L (ref 0–44)
AST: 44 U/L — ABNORMAL HIGH (ref 15–41)
Albumin: 3 g/dL — ABNORMAL LOW (ref 3.5–5.0)
Alkaline Phosphatase: 153 U/L — ABNORMAL HIGH (ref 38–126)
Bilirubin, Direct: 0.2 mg/dL (ref 0.0–0.2)
Indirect Bilirubin: 0.7 mg/dL (ref 0.3–0.9)
Total Bilirubin: 0.9 mg/dL (ref 0.3–1.2)
Total Protein: 7 g/dL (ref 6.5–8.1)

## 2020-07-10 LAB — BASIC METABOLIC PANEL
Anion gap: 9 (ref 5–15)
BUN: <5 mg/dL — ABNORMAL LOW (ref 6–20)
CO2: 24 mmol/L (ref 22–32)
Calcium: 8.6 mg/dL — ABNORMAL LOW (ref 8.9–10.3)
Chloride: 104 mmol/L (ref 98–111)
Creatinine, Ser: 0.54 mg/dL (ref 0.44–1.00)
GFR calc Af Amer: >60 mL/min (ref >60)
GFR calc non Af Amer: >60 mL/min (ref >60)
Glucose, Bld: 93 mg/dL (ref 70–99)
Potassium: 2.9 mmol/L — ABNORMAL LOW (ref 3.5–5.1)
Sodium: 137 mmol/L (ref 135–145)

## 2020-07-10 LAB — CBC WITH DIFFERENTIAL/PLATELET
Abs Immature Granulocytes: 0.02 10*3/uL (ref 0.00–0.07)
Basophils Absolute: 0 10*3/uL (ref 0.0–0.1)
Basophils Relative: 0 %
Eosinophils Absolute: 0 10*3/uL (ref 0.0–0.5)
Eosinophils Relative: 1 %
HCT: 30.9 % — ABNORMAL LOW (ref 36.0–46.0)
Hemoglobin: 10.2 g/dL — ABNORMAL LOW (ref 12.0–15.0)
Immature Granulocytes: 0 %
Lymphocytes Relative: 26 %
Lymphs Abs: 1.2 10*3/uL (ref 0.7–4.0)
MCH: 29 pg (ref 26.0–34.0)
MCHC: 33 g/dL (ref 30.0–36.0)
MCV: 87.8 fL (ref 80.0–100.0)
Monocytes Absolute: 0.3 10*3/uL (ref 0.1–1.0)
Monocytes Relative: 7 %
Neutro Abs: 3 10*3/uL (ref 1.7–7.7)
Neutrophils Relative %: 66 %
Platelets: 250 10*3/uL (ref 150–400)
RBC: 3.52 MIL/uL — ABNORMAL LOW (ref 3.87–5.11)
RDW: 14.3 % (ref 11.5–15.5)
WBC: 4.6 10*3/uL (ref 4.0–10.5)
nRBC: 0 % (ref 0.0–0.2)

## 2020-07-10 LAB — LIPASE, BLOOD: Lipase: 21 U/L (ref 11–51)

## 2020-07-10 LAB — MAGNESIUM: Magnesium: 1.7 mg/dL (ref 1.7–2.4)

## 2020-07-10 MED ORDER — DEXTROSE 5 % AND 0.9 % NACL IV BOLUS: 1000.0000 mL | Freq: Once | INTRAVENOUS | Status: DC

## 2020-07-10 MED ORDER — MAGNESIUM SULFATE 2 GM/50ML IV SOLN
2.0000 g | Freq: Once | INTRAVENOUS | Status: AC
  Administered 2020-07-10: 2 g via INTRAVENOUS
  Filled 2020-07-10: qty 50

## 2020-07-10 MED ORDER — POTASSIUM CHLORIDE CRYS ER 20 MEQ PO TBCR
40.0000 meq | EXTENDED_RELEASE_TABLET | Freq: Once | ORAL | Status: AC
  Administered 2020-07-10: 40 meq via ORAL
  Filled 2020-07-10: qty 2

## 2020-07-10 MED ORDER — METOCLOPRAMIDE HCL 5 MG/ML IJ SOLN
10.0000 mg | INTRAMUSCULAR | Status: AC
  Administered 2020-07-10: 10 mg via INTRAVENOUS
  Filled 2020-07-10: qty 2

## 2020-07-10 MED ORDER — DEXTROSE-NACL 5-0.9 % IV SOLN: Freq: Once | INTRAVENOUS | Status: AC

## 2020-07-10 NOTE — ED Provider Notes (Signed)
[facility] Emergency Department Provider Note  ____________________________________________   First MD Initiated Contact with Patient 07/10/20 2313     (approximate)  I have reviewed the triage vital signs and the nursing notes.   HISTORY  Chief Complaint Emesis    HPI Joanne Pacheco is a 19 y.o. female at approximately [redacted] weeks gestation who was diagnosed with COVID-19 about 2 days ago.  She has had hyperemesis gravidarum throughout pregnancy and was admitted at [facility] about 3 months ago for hyperemesis with bright red hemoptysis thought to be secondary to a Mallory-Weiss tear.  She presents tonight by EMS for evaluation of persistent nausea and vomiting and inability to tolerate anything other than water.  She also noted bright red blood once again today in her emesis although that only happened once.  In general she feels terrible, with severe symptoms of generalized body aches, fever/chills, malaise, fatigue, cough, and some shortness of breath.  These symptoms have gotten worse over the last couple of days.  She is not having any dysuria and is still producing urine.  No diarrhea.  Persistent nausea but she is tolerating water but says she cannot eat any food.  She goes to [facility] for her prenatal care and would like to go to [facility] for delivery but also says that she may end up coming here.        Past Medical History:  Diagnosis Date  . Hyperemesis 02/08/2020  . Obesity     Patient Active Problem List   Diagnosis Date Noted  . Hematemesis 02/09/2020  . Hypokalemia 02/09/2020  . Tachypnea 02/09/2020  . Abnormal LFTs 02/09/2020  . Pregnancy-16 weeks 02/09/2020  . Pleuritic chest pain 02/09/2020  . Obesity   . Epigastric pain   . Hyperemesis 02/08/2020    History reviewed. No pertinent surgical history.  Prior to Admission medications   Medication Sig Start Date End Date Taking? Authorizing Provider  cephALEXin (KEFLEX) 500 MG capsule  Take 1 capsule (500 mg total) by mouth 4 (four) times daily for 7 days. 07/09/20 07/16/20  Lucrezia Starch, MD  Doxylamine-Pyridoxine (DICLEGIS) 10-10 MG TBEC Take two tablets at bedtime on day 1 and 2; if symptoms persist, take 1 tab in AM and 2 tabs at bedtime day 3, then 1 tab every 6 hr PRN 07/09/20   Lucrezia Starch, MD  famotidine (PEPCID) 40 MG tablet Take 1 tablet (40 mg total) by mouth daily for 7 days. 03/08/20 03/15/20  Duffy Bruce, MD  potassium chloride SA (KLOR-CON) 20 MEQ tablet Take 2 tablets (40 mEq total) by mouth 2 (two) times daily. 07/11/20   Hinda Kehr, MD  Prenatal Vit-Fe Fumarate-FA (PRENATAL MULTIVITAMIN) TABS tablet Take 1 tablet by mouth daily at 12 noon.    [provider]  pyridOXINE (VITAMIN B-6) 25 MG tablet Take 1 tablet (25 mg total) by mouth 2 (two) times daily. Helps with nausea 07/09/20   Lucrezia Starch, MD    Allergies Patient has no known allergies.  No family history on file.  Social History Social History   Tobacco Use  . Smoking status: Never Smoker  . Smokeless tobacco: Never Used  Vaping Use  . Vaping Use: Never used  Substance Use Topics  . Alcohol use: Never  . Drug use: Never    Review of Systems Constitutional: Fever, chills, malaise, fatigue Eyes: No visual changes. ENT: Mild sore throat. Cardiovascular: Denies chest pain. Respiratory: Shortness of breath and cough. Gastrointestinal: No abdominal pain.  Persistent nausea and vomiting with some bright red blood in the emesis at least once earlier today. Genitourinary: "False labor" several days ago, no cramping recently, no vaginal bleeding.  Negative for dysuria. Musculoskeletal: Generalized body aches.  Negative for neck pain.  Negative for back pain. Integumentary: Negative for rash. Neurological: Negative for headaches, focal weakness or numbness.   ____________________________________________   PHYSICAL EXAM:  VITAL SIGNS: ED Triage Vitals  Enc Vitals Group      BP 07/10/20 2254 127/63     Pulse Rate 07/10/20 2254 (!) 115     Resp 07/10/20 2254 (!) 25     Temp 07/10/20 2254 98.5 F (36.9 C)     Temp Source 07/10/20 2254 Oral     SpO2 07/10/20 2254 98 %     Weight 07/10/20 2251 119.3 kg (262 lb 15.8 oz)     Height 07/10/20 2251 1.702 m (_0 )     Head Circumference --      Peak Flow --      Pain Score 07/10/20 2251 10     Pain Loc --      Pain Edu? --      Excl. in [place]? --     Constitutional: Alert and oriented.  Eyes: Conjunctivae are normal.  Head: Atraumatic. Nose: No congestion/rhinnorhea. Mouth/Throat: Patient is wearing a mask. Neck: No stridor.  No meningeal signs.   Cardiovascular: Normal rate, regular rhythm. Good peripheral circulation. Grossly normal heart sounds. Respiratory: Normal respiratory effort but slightly increased respiratory rate.  Frequent cough. Gastrointestinal: Gravid uterus appropriate for reported gestational age.  No tenderness to palpation. Musculoskeletal: Mild and symmetrical bilateral lower extremity edema. No gross deformities of extremities. Neurologic:  Normal speech and language. No gross focal neurologic deficits are appreciated.  Skin:  Skin is warm, dry and intact. Psychiatric: Mood and affect are normal. Speech and behavior are normal.  ____________________________________________   LABS (all labs ordered are listed, but only abnormal results are displayed)  Labs Reviewed  CBC WITH DIFFERENTIAL/PLATELET - Abnormal; Notable for the following components:      Result Value   RBC 3.52 (*)    Hemoglobin 10.2 (*)    HCT 30.9 (*)    All other components within normal limits  BASIC METABOLIC PANEL - Abnormal; Notable for the following components:   Potassium 2.9 (*)    BUN <5 (*)    Calcium 8.6 (*)    All other components within normal limits  HEPATIC FUNCTION PANEL - Abnormal; Notable for the following components:   Albumin 3.0 (*)    AST 44 (*)    Alkaline Phosphatase 153 (*)    All  other components within normal limits  BETA-HYDROXYBUTYRIC ACID - Abnormal; Notable for the following components:   Beta-Hydroxybutyric Acid 0.71 (*)    All other components within normal limits  MAGNESIUM  LIPASE, BLOOD  URINALYSIS, ROUTINE W REFLEX MICROSCOPIC   ____________________________________________  EKG  ED ECG REPORT I, Hinda Kehr, the attending physician, personally viewed and interpreted this ECG.  Date: 07/10/2020 EKG Time: 22: 58 Rate: 115 Rhythm: Sinus tachycardia QRS Axis: normal Intervals: normal ST/T Wave abnormalities: Non-specific ST segment / T-wave changes, but no clear evidence of acute ischemia. Narrative Interpretation: no definitive evidence of acute ischemia; does not meet STEMI criteria.   ____________________________________________  RADIOLOGY I, Hinda Kehr, personally viewed and evaluated these images (plain radiographs) as part of my medical decision making, as well as reviewing the written report by the radiologist.  ED  MD interpretation: No indication for emergent imaging  Official radiology report(s): No results found.  ____________________________________________   PROCEDURES   Procedure(s) performed (including Critical Care):  .1-3 Lead EKG Interpretation Performed by: Hinda Kehr, MD Authorized by: Hinda Kehr, MD     Interpretation: abnormal     ECG rate:  108   ECG rate assessment: tachycardic     Rhythm: sinus tachycardia     Ectopy: none     Conduction: normal       ____________________________________________   INITIAL IMPRESSION / MDM / ASSESSMENT AND PLAN / ED COURSE  As part of my medical decision making, I reviewed the following data within the [place] notes reviewed and incorporated, Labs reviewed , EKG interpreted , Old chart reviewed, Discussed with OB/GYN, and reviewed Notes from prior ED visits   Differential diagnosis includes, but is not limited to, hyperemesis  gravidarum, COVID-19, electrolyte or metabolic abnormality resulting from the persistent emesis, Mallory-Weiss tear, less likely esophageal varices.    I reviewed the medical record and see that the patient has suffered from hyperemesis gravidarum throughout this pregnancy and as documented above was hospitalized about 3 months ago [facility] for hyperemesis and Mallory-Weiss tear.  She said that she had improved significantly but has gotten worse over the last few days, concurrent with her recent COVID-19 diagnosis.  She is not in severe respiratory distress although she does have a frequent cough.  She said that she wonders if part of the reason she throws up is when she has a bad coughing spell.  She notes that she has not vomited recently but she did so before she came to the hospital.  She is able to tolerate water but says she has not been able to eat much food for the last couple of days.  Lab work is pending.  The patient is on the cardiac monitor to evaluate for evidence of arrhythmia and/or significant heart rate changes.  Her pulse oximeter shows that she is at 99 to 100% which is reassuring.  She is tachycardic but she is also full-term pregnancy and ill with Covid.  Her blood pressure is within normal limits.  I am treating with D5 normal saline 1 L IV bolus and metoclopramide 10 mg IV.  I will reassess after labs come back to see whether she will be appropriate for discharge and outpatient follow-up for whether she will require admission for her constellation of symptoms.     Clinical Course as of Jul 11 216  Thu Jul 11, 2020  0001 Essentially normal CBC including Hb 10.2.  Lipase normal, LFTs show only slight elevation of AST and alk phos.  Normal Mg.  BMP shows K+ of 2.9, giving potassium 40 meq PO and mag 2 g IV.  Normal anion gap.  UA and beta-hydroxybutyric acid are pending.   [CF]  0035 At least some degree of ketonemia  Beta-Hydroxybutyric Acid(!): 0.71 [CF]  0206 The patient has  tolerated drinking 2 glasses of water and has not vomited.  She clarified with me that she has been eating food at home over the last couple days, just not as much.  I went over her results and explained that at this point there does not seem to be a reason to keep her in the hospital.  I also discussed the case by phone with Dr. Leafy Ro with the OB/GYN service and she agrees with me that if the patient is tolerating oral intake and does not meet  any other criteria for admission due to her COVID-19 diagnosis, and she should be able to go home and follow-up as an outpatient.  I will refer her to the Covid 19 monoclonal antibody infusion clinic to follow-up as an outpatient and she understands and agrees with the plan.   [CF]    Clinical Course User Index [CF] Hinda Kehr, MD     ____________________________________________  FINAL CLINICAL IMPRESSION(S) / ED DIAGNOSES  Final diagnoses:  Hyperemesis gravidarum  COVID-19     MEDICATIONS GIVEN DURING THIS VISIT:  Medications  metoCLOPramide (REGLAN) injection 10 mg (10 mg Intravenous Given 07/10/20 2353)  potassium chloride SA (KLOR-CON) CR tablet 40 mEq (40 mEq Oral Given 07/10/20 2351)  magnesium sulfate IVPB 2 g 50 mL (2 g Intravenous New Bag/Given 07/10/20 2357)  dextrose 5 %-0.9 % sodium chloride infusion ( Intravenous New Bag/Given 07/10/20 2355)     ED Discharge Orders         Ordered    potassium chloride SA (KLOR-CON) 20 MEQ tablet  2 times daily        07/11/20 0217          *Please note:  Kessie Croston was evaluated in Emergency Department on 07/11/2020 for the symptoms described in the history of present illness. She was evaluated in the context of the global COVID-19 pandemic, which necessitated consideration that the patient might be at risk for infection with the SARS-CoV-2 virus that causes COVID-19. Institutional protocols and algorithms that pertain to the evaluation of patients at risk for COVID-19 are in a state of  rapid change based on information released by regulatory bodies including the CDC and federal and state organizations. These policies and algorithms were followed during the patient's care in the ED.  Some ED evaluations and interventions may be delayed as a result of limited staffing during and after the pandemic.*  Note:  This document was prepared using Dragon voice recognition software and may include unintentional dictation errors.   Hinda Kehr, MD 07/11/20 [phone]

## 2020-07-10 NOTE — ED Triage Notes (Addendum)
Pt to ED via EMS from home. Pt is [redacted] weeks pregnant. Pt states she was diagnosed with covid 2 days ago, has been short of breath. Pt had gallbladder removed last month and has been vomiting ever since, as a result of the emesis pt has had esophageal tears, pt states she vomited once today and saw dark red blood in vomit. Pt states blood in vomit is new today.. Pt was seen here yesterday for same except blood in emesis

## 2020-07-11 ENCOUNTER — Encounter: Payer: Self-pay | Admitting: Nurse Practitioner

## 2020-07-11 ENCOUNTER — Telehealth (HOSPITAL_COMMUNITY): Payer: Self-pay | Admitting: Nurse Practitioner

## 2020-07-11 DIAGNOSIS — U071 COVID-19: Secondary | ICD-10-CM

## 2020-07-11 LAB — URINE CULTURE

## 2020-07-11 LAB — BETA-HYDROXYBUTYRIC ACID: Beta-Hydroxybutyric Acid: 0.71 mmol/L — ABNORMAL HIGH (ref 0.05–0.27)

## 2020-07-11 MED ORDER — POTASSIUM CHLORIDE CRYS ER 20 MEQ PO TBCR
40.0000 meq | EXTENDED_RELEASE_TABLET | Freq: Two times a day (BID) | ORAL | 2 refills | Status: DC
Start: 2020-07-11 — End: 2021-01-23

## 2020-07-11 NOTE — Telephone Encounter (Signed)
Called to Discuss with patient about Covid symptoms and the use of regeneron, a monoclonal antibody infusion for those with mild to moderate Covid symptoms and at a high risk of hospitalization.     Pt is qualified for this infusion at the [place] infusion center due to co-morbid conditions and/or a member of an at-risk group.     Unable to reach pt. Left message to return call. Sent mychart message.   Rayner Erman, DNP, AGNP-C [phone] (Infusion Center Hotline)  

## 2020-07-11 NOTE — Discharge Instructions (Signed)
As we discussed, even though you continue to feel ill, your work-up is generally reassuring and it does not appear you need to be admitted to the hospital at this time.  I encourage you to follow-up with your prenatal care provider as well as with the COVID-19 infusion clinic to discuss whether or not you would be a good candidate to receive the monoclonal antibody treatment for COVID-19.  I sent a message to them through the computer system but I encourage you to call the phone number listed in this paperwork as well.  You can even call tonight and leave a message and they will call you back.  Continue to take your regular medications including your nausea medicine.  I also wrote you a prescription for a potassium supplement.      Return to the emergency department if you develop new or worsening symptoms that concern you.

## 2020-09-16 IMAGING — US US ABDOMEN LIMITED
1 series · 14 of 25 positions shown · non-contrast
Comparison: None.

CLINICAL DATA: Elevated LFTs. Pregnant patient at 14 weeks
gestation.

EXAM:
ULTRASOUND ABDOMEN LIMITED RIGHT UPPER QUADRANT

[Series 1: us abdomen limited ruq · 14 of 53 slices shown]
[im 1/53]
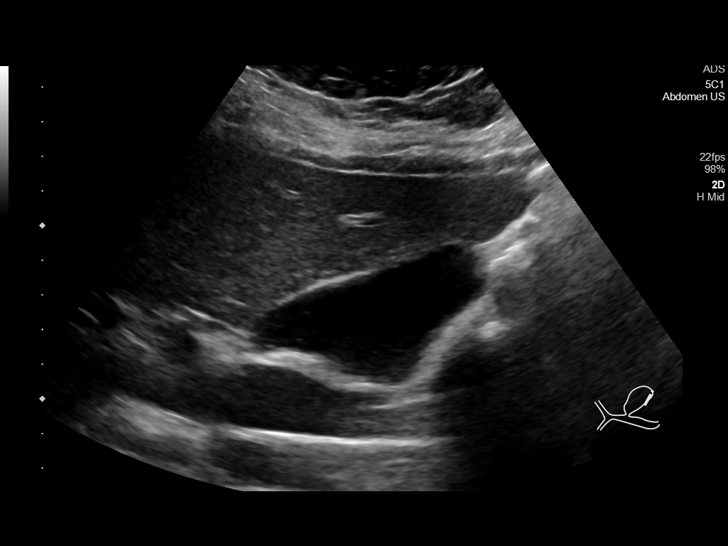
[im 5/53]
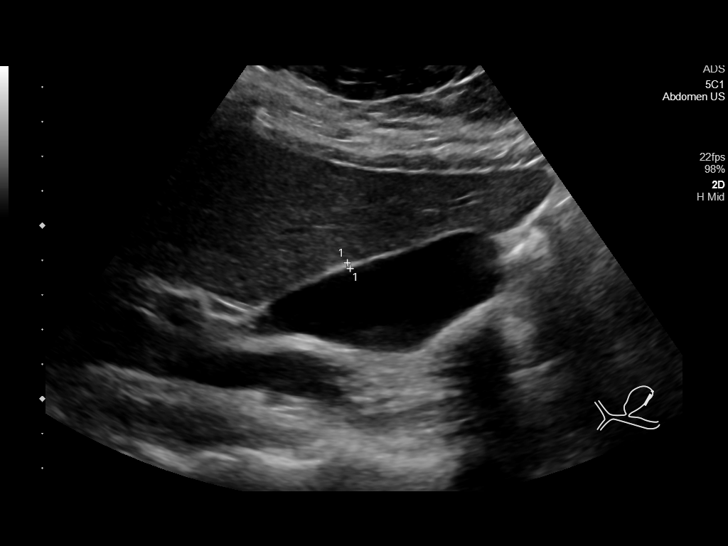
[im 9/53]
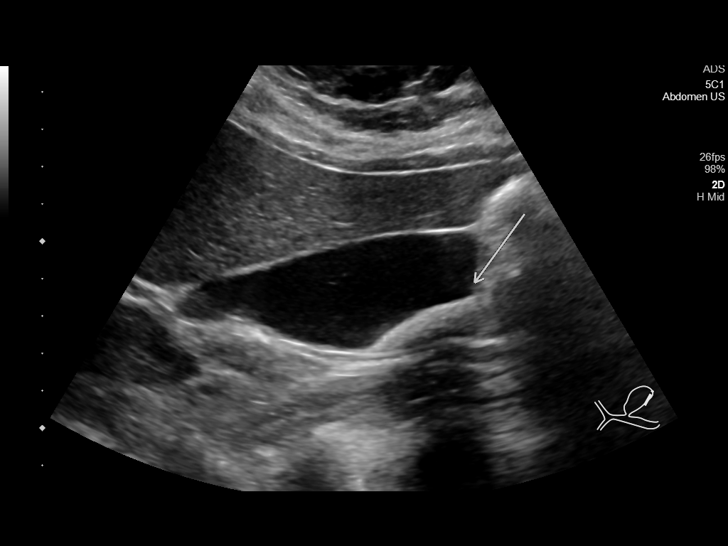
[im 14/53]
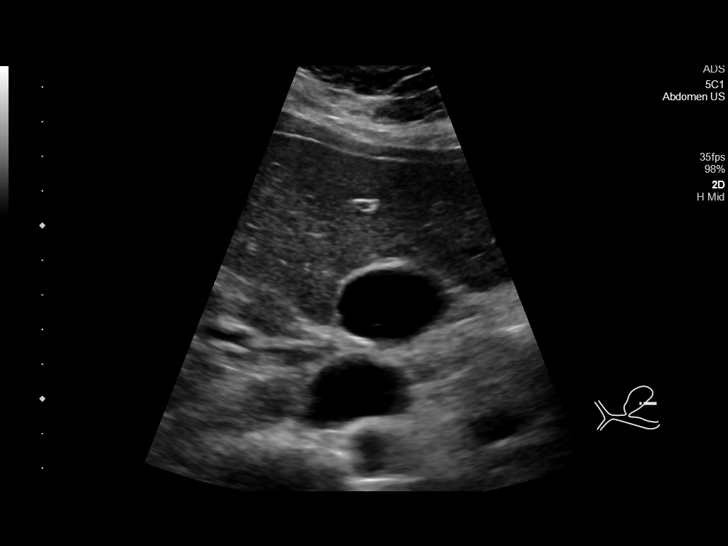
[im 18/53]
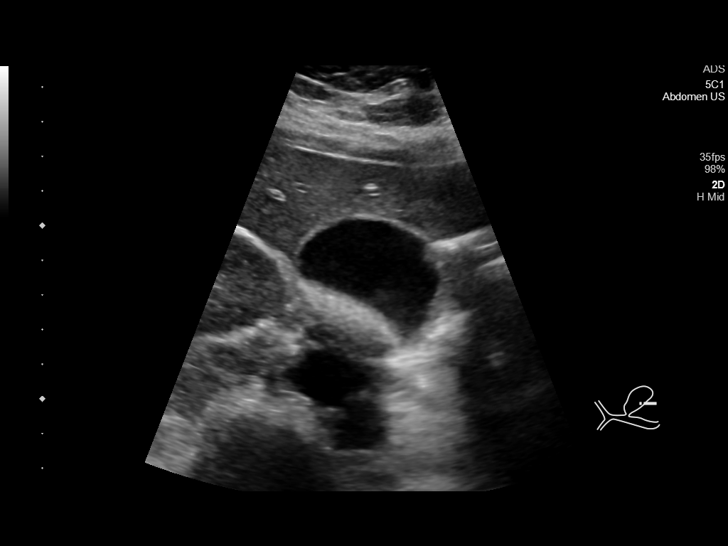
[im 20/53]
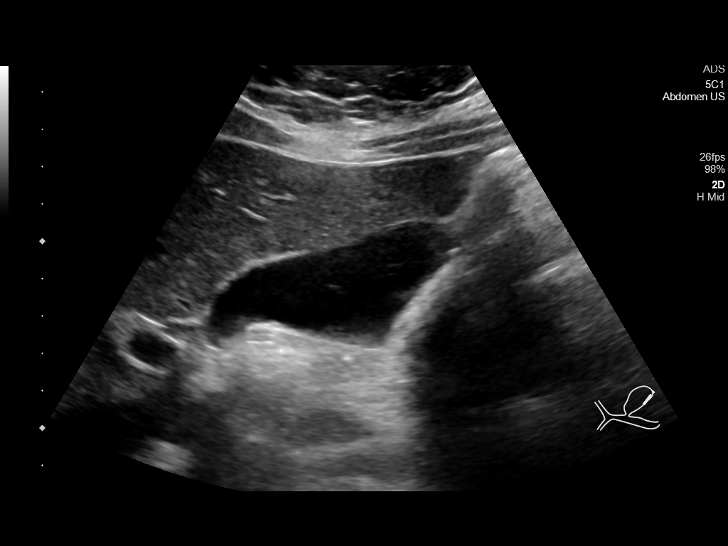
[im 24/53]
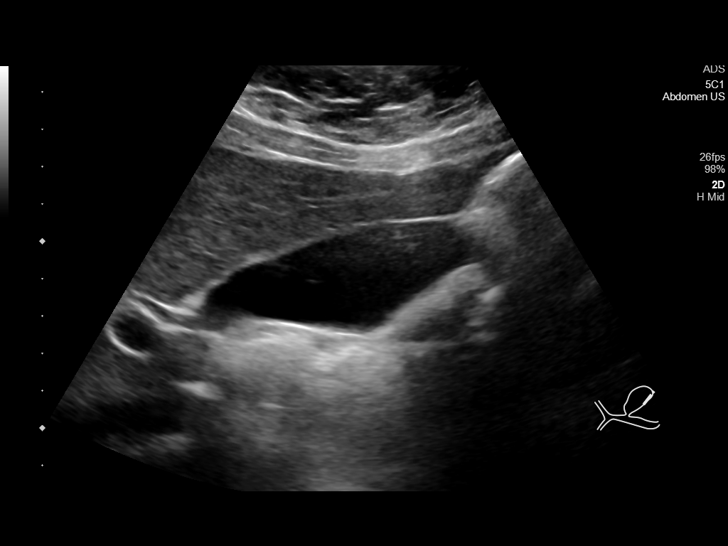
[im 29/53]
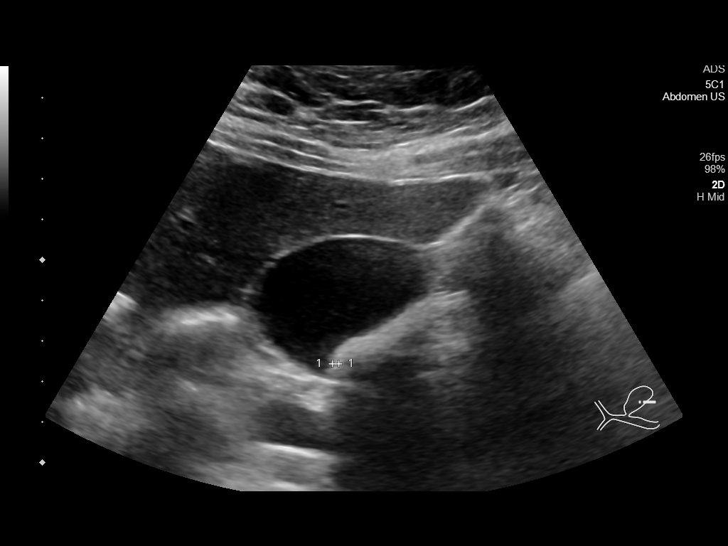
[im 33/53]
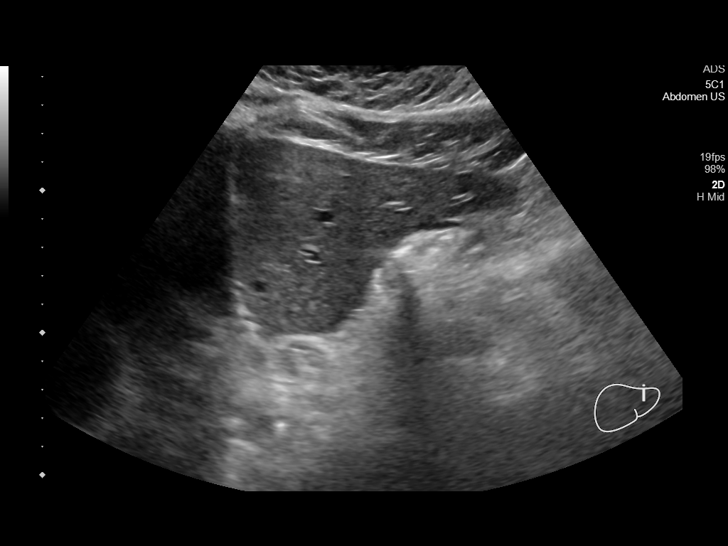
[im 35/53]
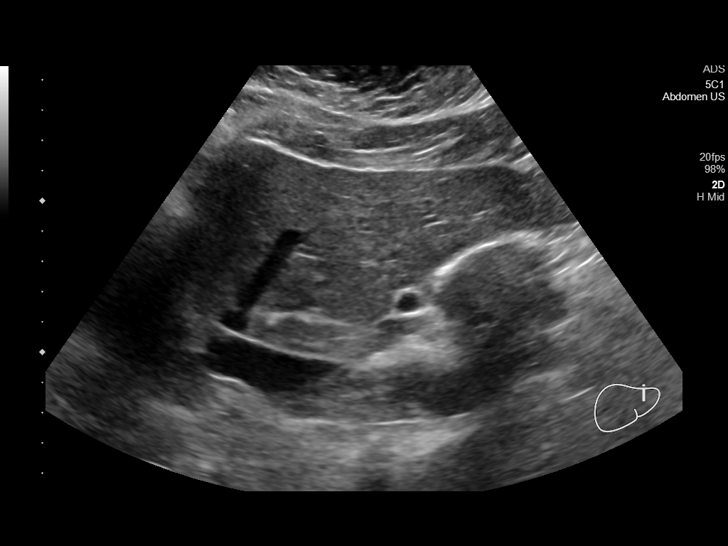
[im 40/53]
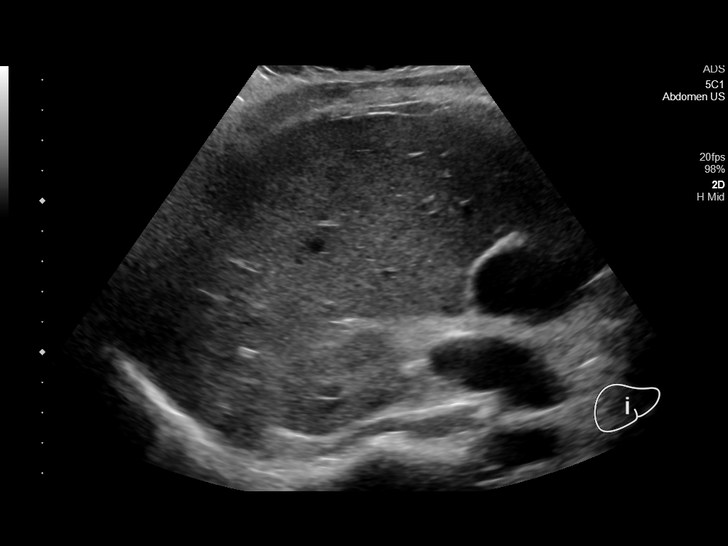
[im 44/53]
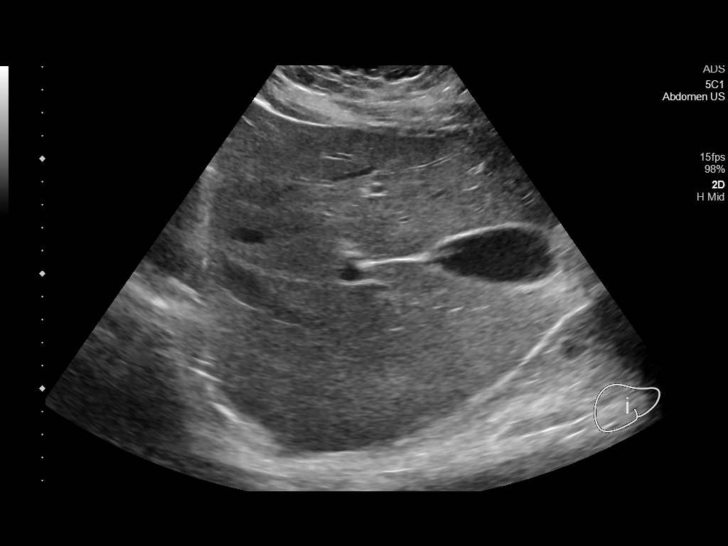
[im 48/53]
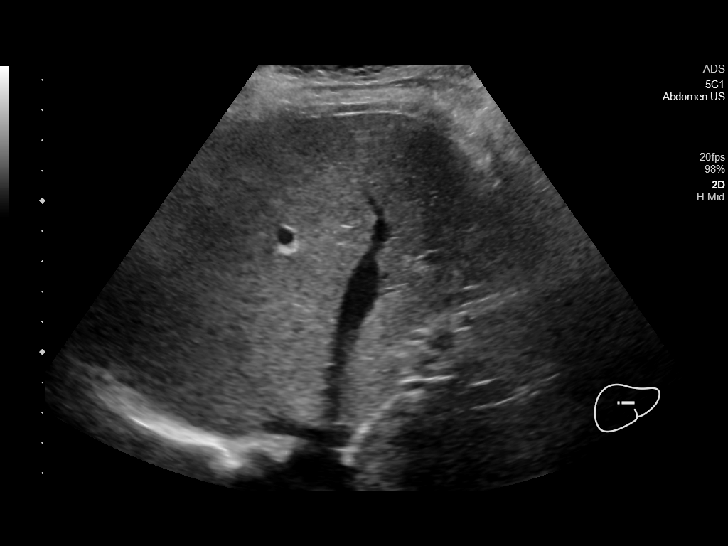
[im 53/53]
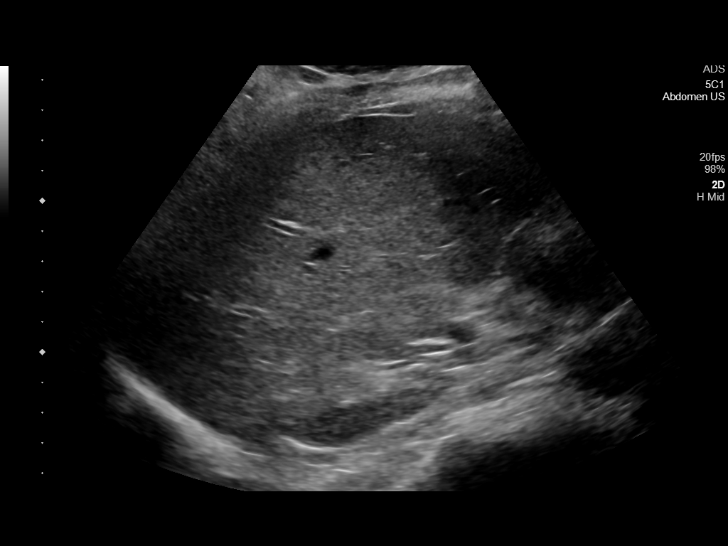

[14 of 25 positions shown; findings below may reference images not displayed]

FINDINGS: Gallbladder:

Physiologically distended. Layering echogenic debris in the
gallbladder likely combination of sludge and small stones. No
gallbladder wall thickening. No pericholecystic fluid. No
sonographic Murphy sign noted by sonographer.

Common bile duct:

Diameter: 2 mm, normal.

Liver:

No focal lesion identified. Equivocal increase in parenchymal
echogenicity compared to right kidney. Portal vein is patent on
color Doppler imaging with normal direction of blood flow towards
the liver.

Other: No right upper quadrant ascites/free fluid.
IMPRESSION: 1. Layering echogenic debris in the gallbladder is likely
combination of sludge and small stones. No sonographic findings of
acute cholecystitis. No biliary dilatation.
2. Borderline hepatic steatosis.

## 2020-09-16 IMAGING — CR DG CHEST 2V
2 series · 2 of 2 positions shown · non-contrast
Comparison: January 10, 2020

CLINICAL DATA: Chest pain and cough

EXAM:
CHEST - 2 VIEW

[chest pa]
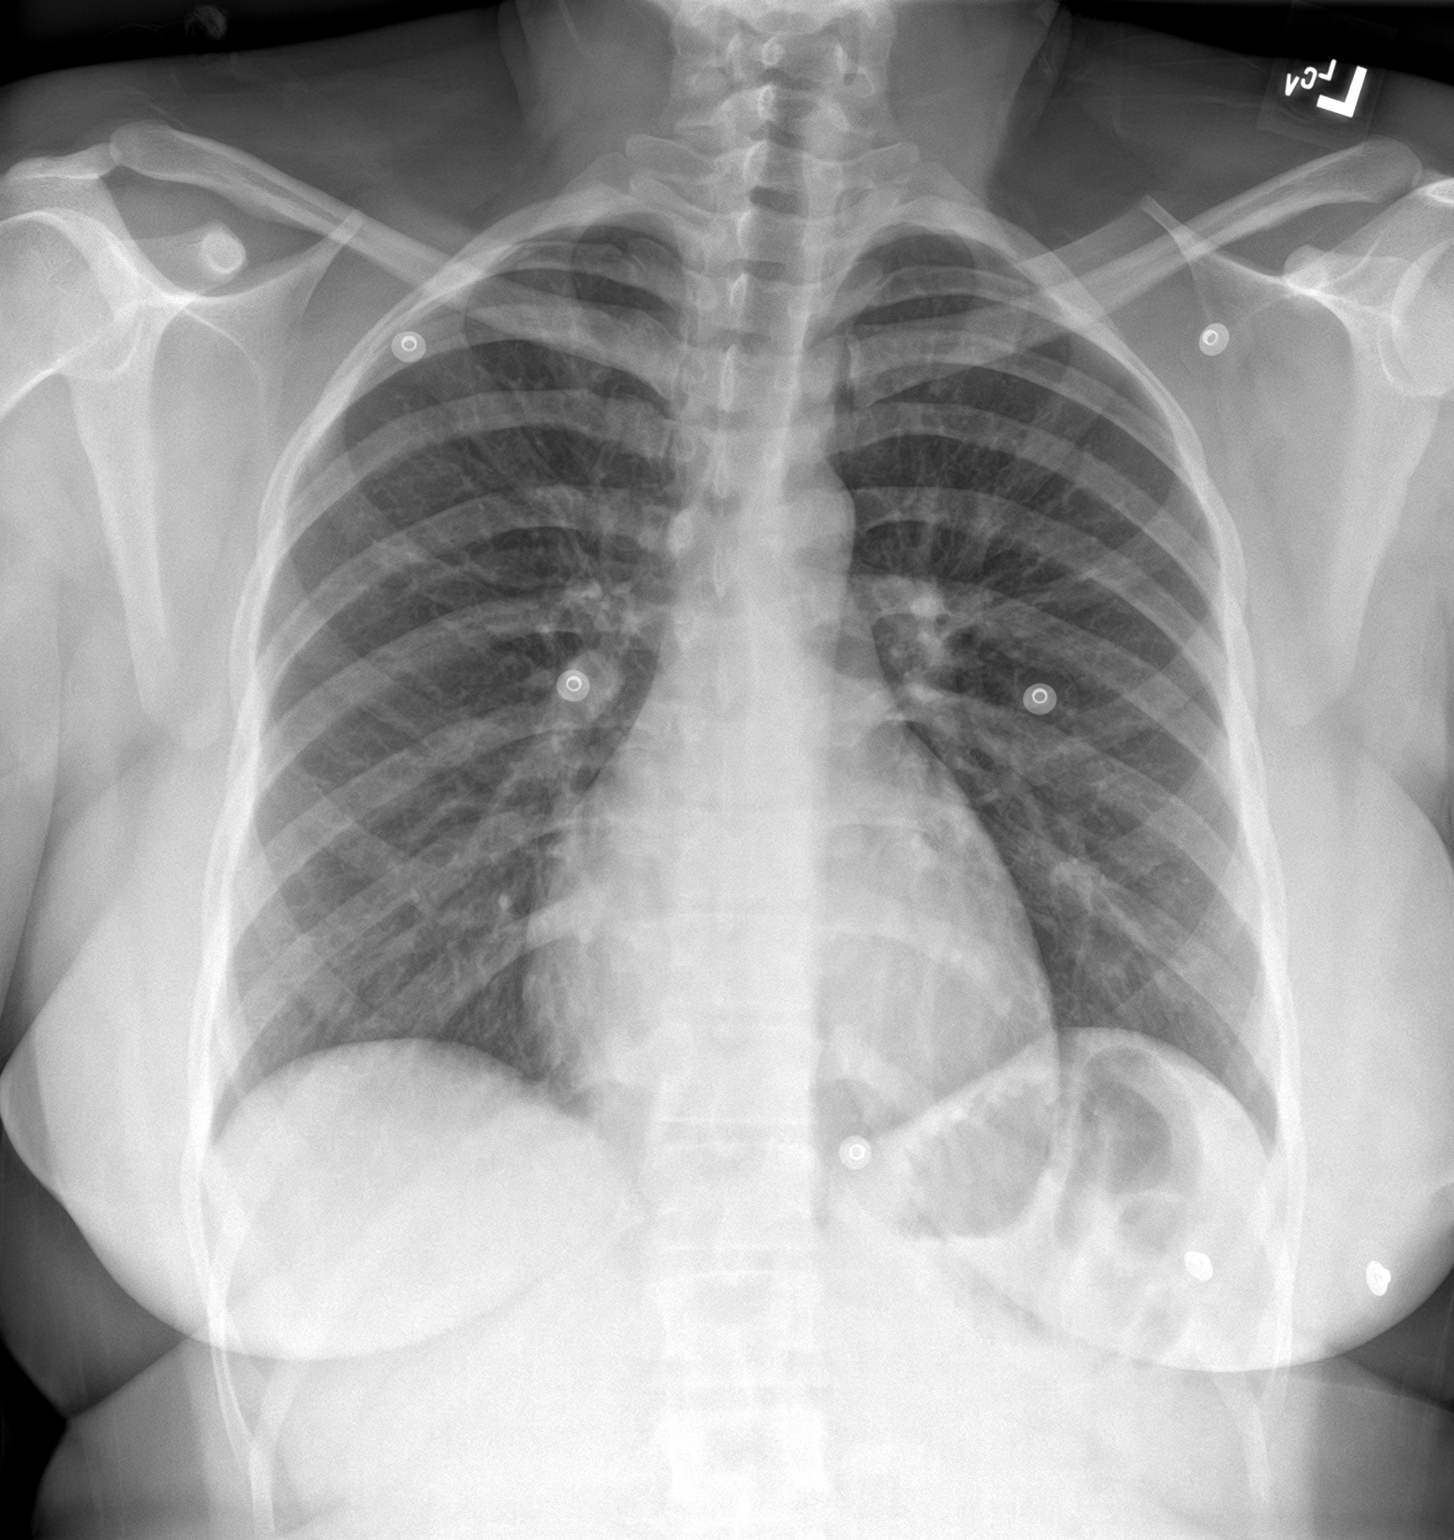

[chest lat]
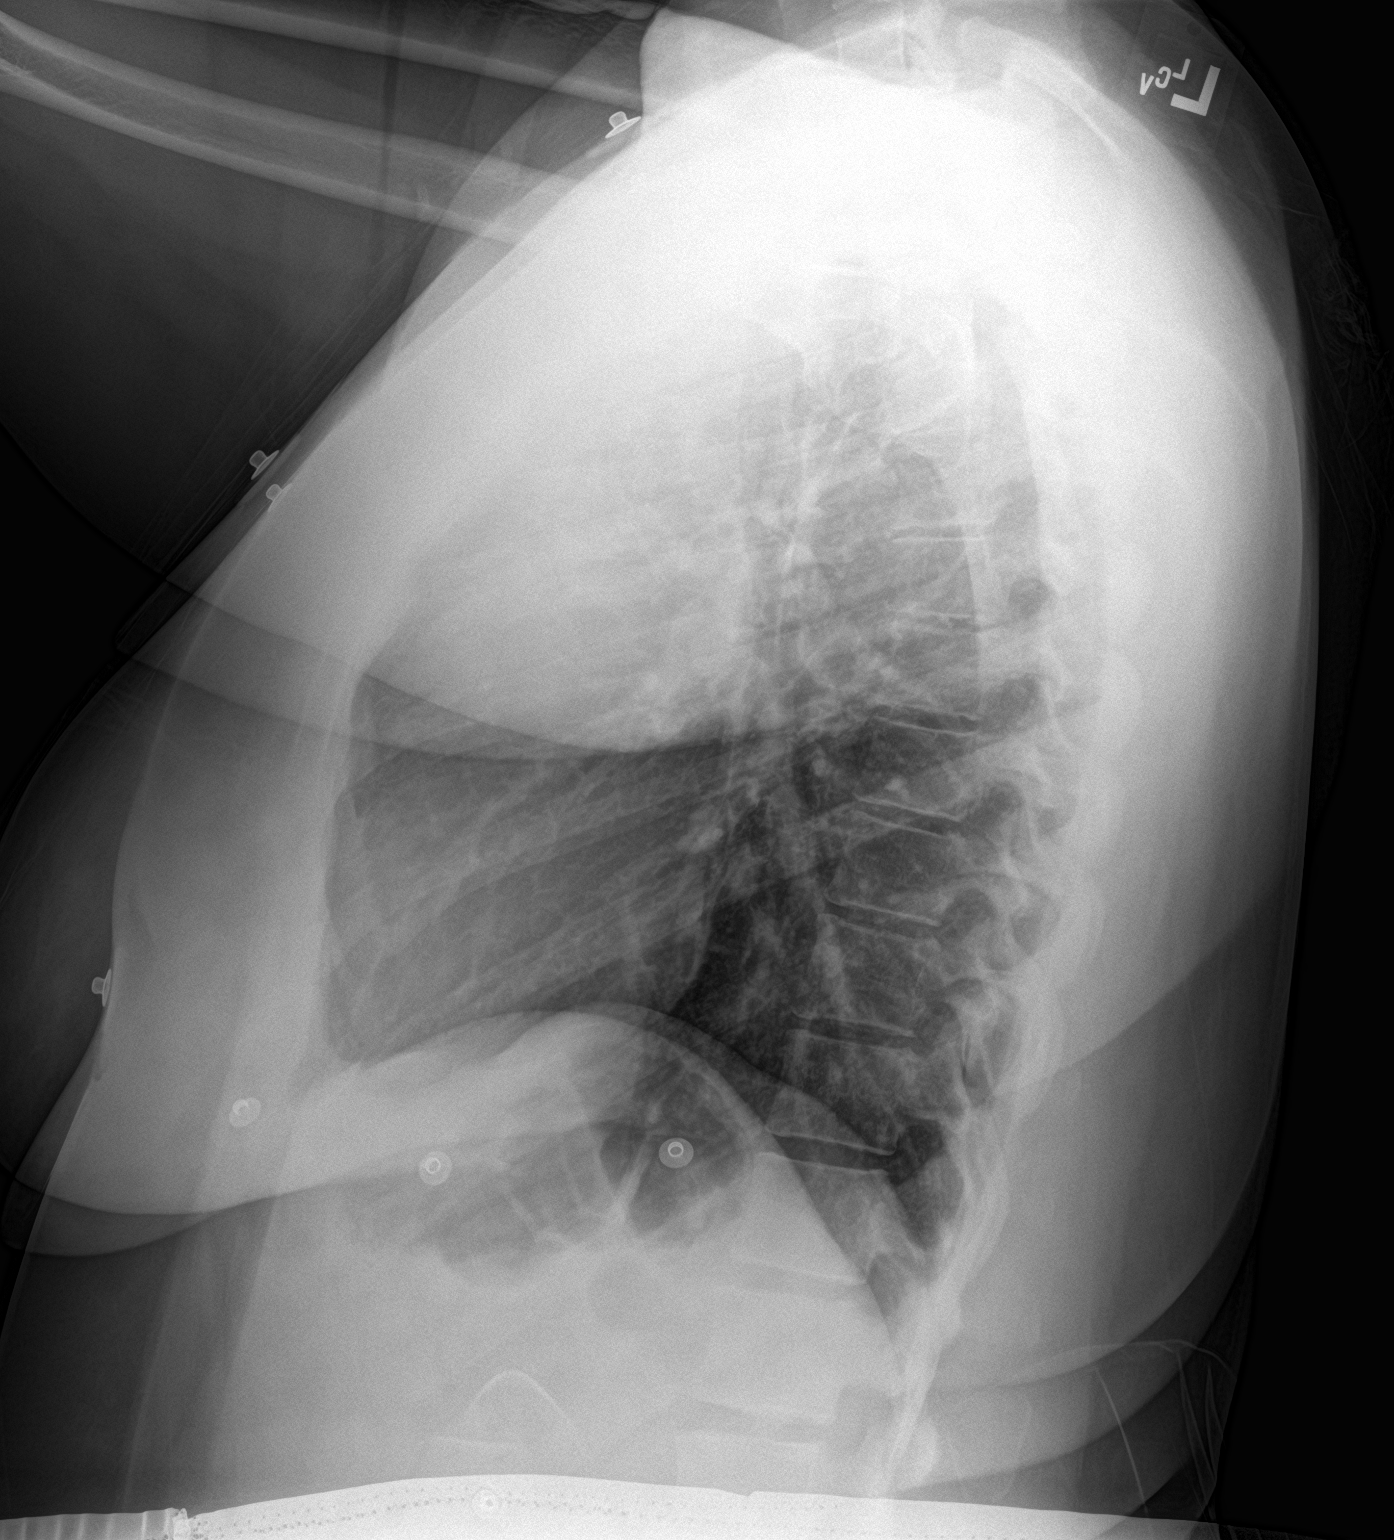

[2 of 2 positions shown; findings below may reference images not displayed]

FINDINGS: The lungs are clear. The heart size and pulmonary vascularity are
normal. No adenopathy. No bone lesions. No pneumothorax.
IMPRESSION: Lungs clear.  Cardiac silhouette within normal limits.

## 2020-10-16 IMAGING — CR DG CHEST 2V
2 series · 2 of 2 positions shown · non-contrast
Comparison: CT 02/08/2020, radiograph 02/08/2020

CLINICAL DATA: Chest pain and shortness of breath, 19 weeks
pregnant

EXAM:
CHEST - 2 VIEW

[chest pa]
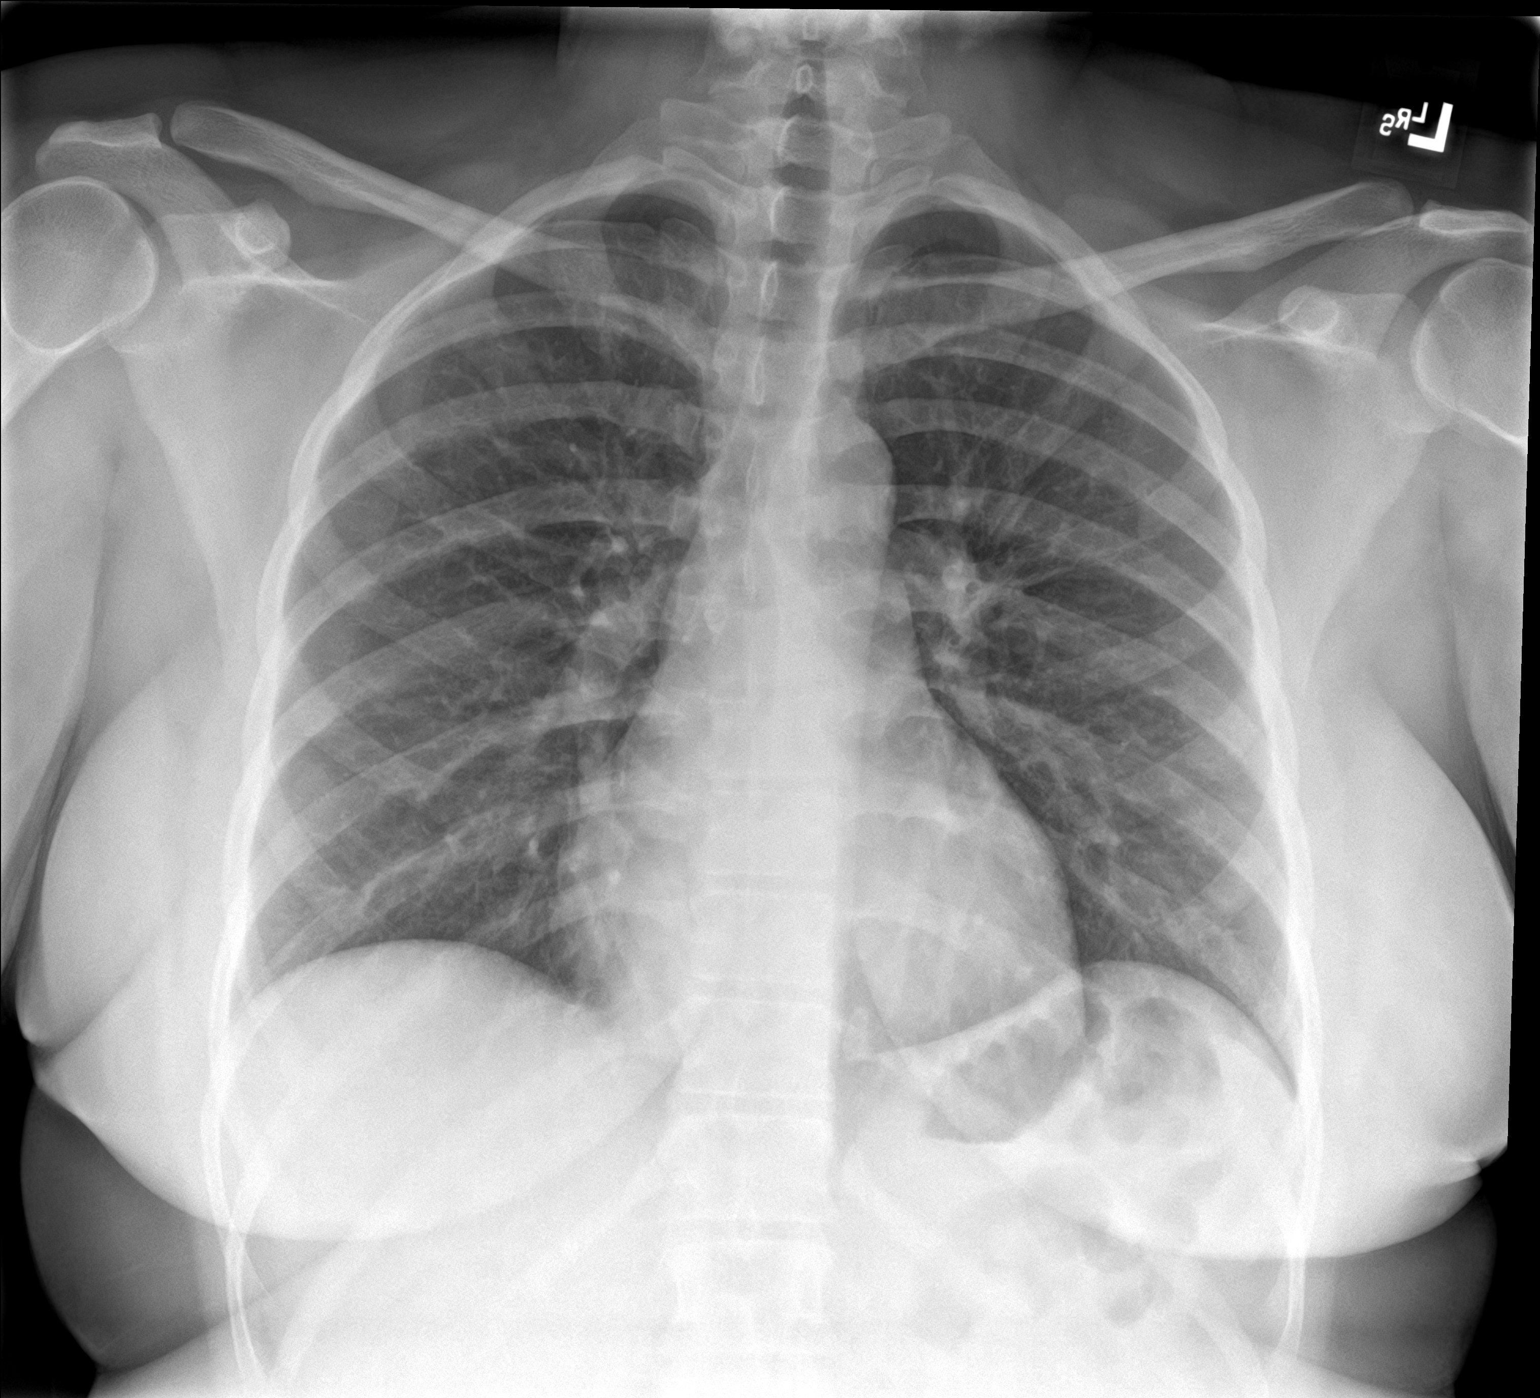

[chest lat]
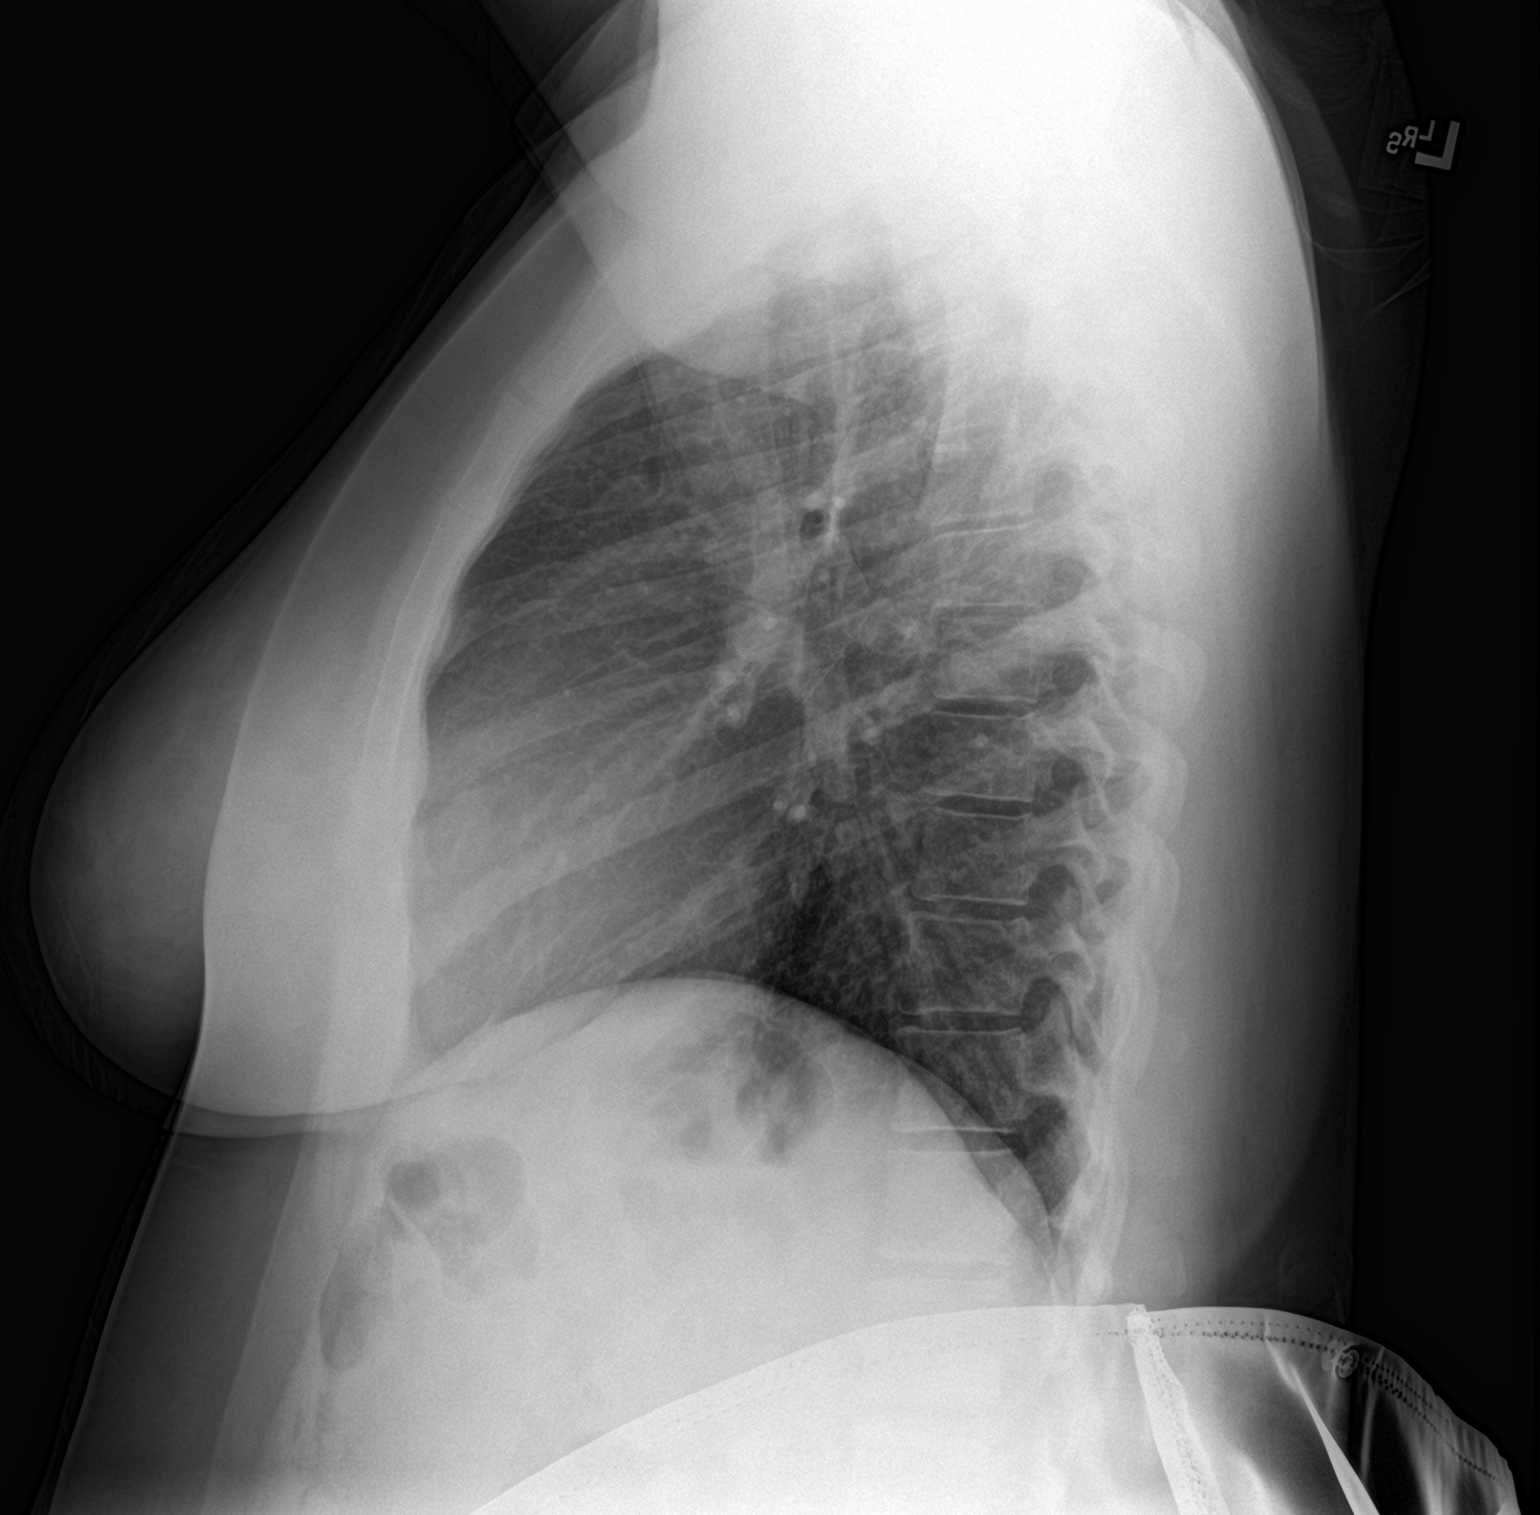

[2 of 2 positions shown; findings below may reference images not displayed]

FINDINGS: No consolidation, features of edema, pneumothorax, or effusion.
Pulmonary vascularity is normally distributed. The cardiomediastinal
contours are unremarkable. No acute osseous or soft tissue
abnormality.
IMPRESSION: No acute cardiopulmonary abnormality.

## 2020-11-16 NOTE — L&D Delivery Note (Signed)
Date of delivery: 09/05/2021 Estimated Date of Delivery: 09/09/21 Patient's last menstrual period was 12/03/2020 (exact date). EGA: [redacted]w[redacted]d  Delivery Note At 10:17 AM a viable female was delivered via VBAC, Spontaneous  Presentation: OA/Left Occiput Anterior   APGAR: 9, 9;    Weight: 6 lb 8.4 oz (2960 g).   Placenta status: Spontaneous, Intact.   Cord: 3 vessels with the following complications: None.  Cord pH: NA  Called to see patient.  Mom pushed to deliver a viable female infant.  The head followed by shoulders, which delivered without difficulty, and the rest of the body.  Shoulder/body cord noted reduced after delivery.  Baby to mom's chest.  Cord clamped and cut after 3 min delay.  Cord blood obtained.  Placenta delivered spontaneously, intact, with a 3-vessel cord.  All counts correct. Initially brisk bright red bleeding. Fundus firm. Hemostasis obtained with IV pitocin, rectal cytotec 800 mcg and fundal massage.    Anesthesia: Epidural Episiotomy: None Lacerations: Right upper Vaginal hemostatic superficial skin split Suture Repair:  NA Est. Blood Loss (mL): 600  Mom to postpartum.  Baby to Couplet care / Skin to Skin.  Tresea Mall, CNM 09/05/2021, 11:23 AM

## 2021-01-22 ENCOUNTER — Other Ambulatory Visit: Payer: Self-pay

## 2021-01-22 ENCOUNTER — Ambulatory Visit (LOCAL_COMMUNITY_HEALTH_CENTER): Payer: Medicaid Other

## 2021-01-22 VITALS — BP 106/66 | Ht 66.0 in | Wt 247.4 lb

## 2021-01-22 DIAGNOSIS — Z3201 Encounter for pregnancy test, result positive: Secondary | ICD-10-CM

## 2021-01-22 LAB — PREGNANCY, URINE: Preg Test, Ur: POSITIVE — AB

## 2021-01-22 MED ORDER — PRENATAL 27-0.8 MG PO TABS: 1.0000 | ORAL_TABLET | Freq: Every day | ORAL | 0 refills | Status: AC

## 2021-01-22 NOTE — Progress Notes (Signed)
UPT positive. Plans prenatal care at [facility]. PHQ 10 today. Reports supportive family and friends, and work environment.  Admits some depression since son died at one month after delivery 08/2020. Declines to speak with anyone today. Accepts Hazeline Junker, LCSW contact card and [employer] contact card. Denies thoughts of hurting self/others. Advised to establish prenatal care ASAP d/t possible high risk status. Pt in agreement. Pt to clerk for preadmit. Jerel Shepherd, RN

## 2021-01-23 ENCOUNTER — Ambulatory Visit (INDEPENDENT_AMBULATORY_CARE_PROVIDER_SITE_OTHER): Payer: Medicaid Other | Admitting: Obstetrics and Gynecology

## 2021-01-23 ENCOUNTER — Encounter: Payer: Self-pay | Admitting: Obstetrics and Gynecology

## 2021-01-23 ENCOUNTER — Other Ambulatory Visit (HOSPITAL_COMMUNITY)
Admission: RE | Admit: 2021-01-23 | Discharge: 2021-01-23 | Disposition: A | Discharge status: Home / Self Care | Payer: Medicaid Other | Source: Ambulatory Visit | Attending: Obstetrics and Gynecology | Admitting: Obstetrics and Gynecology

## 2021-01-23 VITALS — BP 118/75 | Wt 245.2 lb

## 2021-01-23 DIAGNOSIS — Z3490 Encounter for supervision of normal pregnancy, unspecified, unspecified trimester: Secondary | ICD-10-CM | POA: Insufficient documentation

## 2021-01-23 DIAGNOSIS — Z3481 Encounter for supervision of other normal pregnancy, first trimester: Secondary | ICD-10-CM

## 2021-01-23 DIAGNOSIS — Z98891 History of uterine scar from previous surgery: Secondary | ICD-10-CM

## 2021-01-23 DIAGNOSIS — R945 Abnormal results of liver function studies: Secondary | ICD-10-CM

## 2021-01-23 DIAGNOSIS — Z0283 Encounter for blood-alcohol and blood-drug test: Secondary | ICD-10-CM

## 2021-01-23 DIAGNOSIS — Z8679 Personal history of other diseases of the circulatory system: Secondary | ICD-10-CM

## 2021-01-23 DIAGNOSIS — Z3A01 Less than 8 weeks gestation of pregnancy: Secondary | ICD-10-CM

## 2021-01-23 DIAGNOSIS — O09891 Supervision of other high risk pregnancies, first trimester: Secondary | ICD-10-CM

## 2021-01-23 DIAGNOSIS — O99210 Obesity complicating pregnancy, unspecified trimester: Secondary | ICD-10-CM

## 2021-01-23 DIAGNOSIS — Z3492 Encounter for supervision of normal pregnancy, unspecified, second trimester: Secondary | ICD-10-CM | POA: Insufficient documentation

## 2021-01-23 DIAGNOSIS — Z3149 Encounter for other procreative investigation and testing: Secondary | ICD-10-CM

## 2021-01-23 DIAGNOSIS — Z130 Encounter for screening for diseases of the blood and blood-forming organs and certain disorders involving the immune mechanism: Secondary | ICD-10-CM

## 2021-01-23 DIAGNOSIS — Z113 Encounter for screening for infections with a predominantly sexual mode of transmission: Secondary | ICD-10-CM

## 2021-01-23 DIAGNOSIS — Z3480 Encounter for supervision of other normal pregnancy, unspecified trimester: Secondary | ICD-10-CM | POA: Insufficient documentation

## 2021-01-23 DIAGNOSIS — O09899 Supervision of other high risk pregnancies, unspecified trimester: Secondary | ICD-10-CM | POA: Insufficient documentation

## 2021-01-23 DIAGNOSIS — N926 Irregular menstruation, unspecified: Secondary | ICD-10-CM

## 2021-01-23 DIAGNOSIS — O219 Vomiting of pregnancy, unspecified: Secondary | ICD-10-CM

## 2021-01-23 DIAGNOSIS — O281 Abnormal biochemical finding on antenatal screening of mother: Secondary | ICD-10-CM

## 2021-01-23 DIAGNOSIS — Z7185 Encounter for immunization safety counseling: Secondary | ICD-10-CM

## 2021-01-23 DIAGNOSIS — R7989 Other specified abnormal findings of blood chemistry: Secondary | ICD-10-CM

## 2021-01-23 LAB — OB RESULTS CONSOLE VARICELLA ZOSTER ANTIBODY, IGG: Varicella: IMMUNE

## 2021-01-23 MED ORDER — DOXYLAMINE-PYRIDOXINE 10-10 MG PO TBEC: 2.0000 | DELAYED_RELEASE_TABLET | Freq: Every day | ORAL | 5 refills | Status: DC

## 2021-01-23 NOTE — Progress Notes (Signed)
New Obstetric Patient H&P    Chief Complaint: Missed period, +UPT   History of Present Illness: Patient is a 20 y.o. G2P1000 Not Hispanic or Latino female, presents with amenorrhea and positive home pregnancy test. Patient's last menstrual period was 12/03/2020 (exact date). and based on her  LMP, her EDD is Estimated Date of Delivery: 09/09/21 and her EGA is [redacted]w[redacted]d. Patient reports cycles have been irregular since previous delivery in 07/2020. Patient is less than 75 years old and has not had a pap smear.   She had a urine pregnancy test which was positive 1 day(s)  ago. Her last menstrual period was normal and lasted for  4 or 5 day(s). Since her LMP she claims she has experienced increase in symptoms of nausea and vomiting. Patient states she is currently tolerating oral intake, despite nausea. She denies vaginal bleeding. Her past medical history is significant for abnormal LFTs during her last pregnancy. Her prior pregnancies are notable for cesearan delivery secondary to fetal intolerance at 39 weeks - patient had been recently diagnosed with Covid-19 infection. Of note, the infant underwent cooling and subsequently past a month after delivery. Patient is still processing this experience.  Since her LMP, she admits to the use of tobacco products  no She claims she has gained   no weight since the start of her pregnancy.  There are cats in the home in the home  no  She admits close contact with children on a regular basis  no  She has had chicken pox in the past no She has had Tuberculosis exposures, symptoms, or previously tested positive for TB   no Current or past history of domestic violence. no  Genetic Screening/Teratology Counseling: (Includes patient, baby's father, or anyone in either family with:)   1. Patient's age >/= 85 [facility]  no 2. Thalassemia ([place], [place], Mediterranean, or Asian background): MCV<80  no 3. Neural tube defect (meningomyelocele, spina bifida, anencephaly)   no 4. Congenital heart defect  no  5. Down syndrome  no 6. Tay-Sachs (Jewish, [place]  no 7. Canavan's Disease  no 8. Sickle cell disease or trait (African)  no  9. Hemophilia or other blood disorders  no  10. Muscular dystrophy  no  11. Cystic fibrosis  no  12. Huntington's Chorea  no  13. Mental retardation/autism  no 14. Other inherited genetic or chromosomal disorder  no 15. Maternal metabolic disorder (DM, PKU, etc)  no 16. Patient or FOB with a child with a birth defect not listed above no  16a. Patient or FOB with a birth defect themselves no 17. Recurrent pregnancy loss, or stillbirth  no  18. Any medications since LMP other than prenatal vitamins (include vitamins, supplements, OTC meds, drugs, alcohol)  no 19. Any other genetic/environmental exposure to discuss  no  Infection History:   1. Lives with someone with TB or TB exposed  no  2. Patient or partner has history of genital herpes  no 3. Rash or viral illness since LMP  no 4. History of STI (GC, CT, HPV, syphilis, HIV)  yes 5. History of recent travel :  no  Other pertinent information:  Patient works at [employer]. Patient lives with sister "Isis." Patient reports a very difficult time the past few months following her infant sons passing. Patient is excited about this pregnancy.     Review of Systems:10 point review of systems negative unless otherwise noted in HPI  Past Medical History:  Patient Active  Problem List   Diagnosis Date Noted  . Nausea/vomiting in pregnancy 01/23/2021  . History of cesarean section 01/23/2021  . Encounter for supervision of other normal pregnancy, first trimester 01/23/2021     Nursing Staff Provider  Office Location  [facility] Dating   LMP  Language  English Anatomy US    Flu Vaccine   declined Genetic Screen  NIPS:   desires  TDaP vaccine    Hgb A1C or  GTT Early : Third trimester :   Rhogam   n/a   LAB RESULTS   Feeding Plan  breast Blood Type --/--/O POS  (03/25 0855)   Contraception  Antibody NEG (03/25 0855)  Circumcision  Rubella 1.83 (03/26 2027)  Pediatrician    RPR NON REACTIVE (03/26 2027)   Support Person  [name] HBsAg NON REACTIVE (03/25 1046)   Prenatal Classes   HIV NON REACTIVE (03/26 0532)    Varicella @varicellaresultconsole @   BTL Consent  n/a GBS  (For PCN allergy, check sensitivities)        VBAC Consent  desires Pap  n/a <21 yo    Hgb Electro      CF    COVID-19  declined vax  had in 9/21 SMA            . Abnormal LFTs 02/09/2020  . Obesity     Past Surgical History:  Past Surgical History:  Procedure Laterality Date  . CESAREAN SECTION     07/18/2020  . CHOLECYSTECTOMY     2021 (at [redacted] weeks gestation)    Gynecologic History: Patient's last menstrual period was 12/03/2020 (exact date).  Obstetric History: G2P1000  Family History:  History reviewed. No pertinent family history.  Social History:  Social History   Socioeconomic History  . Marital status: Single    Spouse name: Not on file  . Number of children: Not on file  . Years of education: Not on file  . Highest education level: Not on file  Occupational History  . Not on file  Tobacco Use  . Smoking status: Former Smoker    Types: Cigarettes    Quit date: 01/14/2021    Years since quitting: 0.0  . Smokeless tobacco: Never Used  Vaping Use  . Vaping Use: Never used  Substance and Sexual Activity  . Alcohol use: Not Currently    Comment: last use 01/02/21- one glass wine  . Drug use: Never  . Sexual activity: Yes    Birth control/protection: None  Other Topics Concern  . Not on file  Social History Narrative  . Not on file   Social Determinants of Health   Financial Resource Strain: Not on file  Food Insecurity: Not on file  Transportation Needs: Not on file  Physical Activity: Not on file  Stress: Not on file  Social Connections: Not on file  Intimate Partner Violence: Not At Risk  . Fear of Current or Ex-Partner: No   . Emotionally Abused: No  . Physically Abused: No  . Sexually Abused: No    Allergies:  No Known Allergies  Medications: Prior to Admission medications   Medication Sig Start Date End Date Taking? Authorizing Provider  Doxylamine-Pyridoxine (DICLEGIS) 10-10 MG TBEC Take 2 tablets by mouth at bedtime. If symptoms persist, add one tablet in the morning and one in the afternoon 01/23/21  Yes Adisa Vigeant, CNM  Prenatal Vit-Fe Fumarate-FA (MULTIVITAMIN-PRENATAL) 27-0.8 MG TABS tablet Take 1 tablet by mouth daily at 12 noon. 01/22/21 05/02/21 Yes Newton,  Isa Rankin, MD    Physical Exam Vitals: Blood pressure 118/75, weight 245 lb 3.2 oz (111.2 kg), last menstrual period 12/03/2020.  General: NAD HEENT: normocephalic, anicteric Thyroid: no enlargement, no palpable nodules Pulmonary: No increased work of breathing, CTAB Cardiovascular: RRR, distal pulses 2+ Abdomen: NABS, soft, non-tender, non-distended.  Umbilicus without lesions.  No hepatomegaly, splenomegaly or masses palpable. No evidence of hernia  Genitourinary:  External: Normal external female genitalia.  Normal urethral meatus, normal  Bartholin's and Skene's glands.    Vagina: Normal vaginal mucosa, no evidence of prolapse.    Cervix: Grossly normal in appearance, no bleeding  Bimanual deferred  Rectal: deferred Extremities: no edema, erythema, or tenderness Neurologic: Grossly intact Psychiatric: mood appropriate, affect full  Assessment: 20 y.o. G2P1000 at [redacted]w[redacted]d presenting to initiate prenatal care  Plan: 1) Avoid alcoholic beverages. 2) Patient encouraged not to smoke.  3) Discontinue the use of all non-medicinal drugs and chemicals.  4) Take prenatal vitamins daily.  5) Nutrition, food safety (fish, cheese advisories, and high nitrite foods) and exercise discussed. 6) Hospital and practice style discussed with cross coverage system.  7) Genetic Screening, such as with 1st Trimester Screening, cell free fetal DNA,  AFP testing, and Ultrasound, as well as with amniocentesis and CVS as appropriate, is discussed with patient. At the conclusion of today's visit patient requested genetic testing  8) NOB labs/inheritest/sickle cell screen/urine/GC/CT collected today  9) Patient to RTC for 1h GTT and dating [place] with next visit  10) Discussed BMI and rec for weight gain in pregnancy  11) CMP for hx of elevated LFTs with previous pregnancy  12) N/V - diclegis rx'd, info provided for OTC treatment options - patient currently tolerating oral intake well, hx of hyperemesis with last pregnancy  13) Desires TOLAC   Zipporah Plants, CNM, MSN [facility], [facility] 01/23/2021, 10:11 AM

## 2021-01-23 NOTE — Patient Instructions (Signed)
For nausea (these may be purchased over-the-counter): -Vitamin B6 (pyridoxine):  25 mg three times each day (may buy 100 mg tablet and take twice per day or try to cut into 4 equal pieces and take 1 piece three times each day).  - doxylamine (found in [employer] and other sleep agents that can be bought in the store): take 12.5 - 25 mg at bedtime.  May take up to 25 mg three time each day.  However, keep in mind that this might make you sleepy.    Obstetrics: Normal and Problem Pregnancies (7th ed., pp. 102-121). [place], [place]: Elsevier."> Textbook of Family Medicine (9th ed., pp. 365-410). [place], [place]: Else[employer].">  First Trimester of Pregnancy  The first trimester of pregnancy starts on the first day of your last menstrual period until the end of week 12. This is months 1 through 3 of pregnancy. A week after a sperm fertilizes an egg, the egg will implant into the wall of the uterus and begin to develop into a baby. By the end of 12 weeks, all the baby's organs will be formed and the baby will be 2-3 inches in size. Body changes during your first trimester Your body goes through many changes during pregnancy. The changes vary and generally return to normal after your baby is born. Physical changes  You may gain or lose weight.  Your breasts may begin to grow larger and become tender. The tissue that surrounds your nipples (areola) may become darker.  Dark spots or blotches (chloasma or mask of pregnancy) may develop on your face.  You may have changes in your hair. These can include thickening or thinning of your hair or changes in texture. Health changes  You may feel nauseous, and you may vomit.  You may have heartburn.  You may develop headaches.  You may develop constipation.  Your gums may bleed and may be sensitive to brushing and flossing. Other changes  You may tire easily.  You may urinate more often.  Your menstrual periods will stop.  You may have a  loss of appetite.  You may develop cravings for certain kinds of food.  You may have changes in your emotions from day to day.  You may have more vivid and strange dreams. Follow these instructions at home: Medicines  Follow your health care provider's instructions regarding medicine use. Specific medicines may be either safe or unsafe to take during pregnancy. Do not take any medicines unless told to by your health care provider.  Take a prenatal vitamin that contains at least 600 micrograms (mcg) of folic acid. Eating and drinking  Eat a healthy diet that includes fresh fruits and vegetables, whole grains, good sources of protein such as meat, eggs, or tofu, and low-fat dairy products.  Avoid raw meat and unpasteurized juice, milk, and cheese. These carry germs that can harm you and your baby.  If you feel nauseous or you vomit: ? Eat 4 or 5 small meals a day instead of 3 large meals. ? Try eating a few soda crackers. ? Drink liquids between meals instead of during meals.  You may need to take these actions to prevent or treat constipation: ? Drink enough fluid to keep your urine pale yellow. ? Eat foods that are high in fiber, such as beans, whole grains, and fresh fruits and vegetables. ? Limit foods that are high in fat and processed sugars, such as fried or sweet foods. Activity  Exercise only as directed by your health   care provider. Most people can continue their usual exercise routine during pregnancy. Try to exercise for 30 minutes at least 5 days a week.  Stop exercising if you develop pain or cramping in the lower abdomen or lower back.  Avoid exercising if it is very hot or humid or if you are at high altitude.  Avoid heavy lifting.  If you choose to, you may have sex unless your health care provider tells you not to. Relieving pain and discomfort  Wear a good support bra to relieve breast tenderness.  Rest with your legs elevated if you have leg cramps or low  back pain.  If you develop bulging veins (varicose veins) in your legs: ? Wear support hose as told by your health care provider. ? Elevate your feet for 15 minutes, 3-4 times a day. ? Limit salt in your diet. Safety  Wear your seat belt at all times when driving or riding in a car.  Talk with your health care provider if someone is verbally or physically abusive to you.  Talk with your health care provider if you are feeling sad or have thoughts of hurting yourself. Lifestyle  Do not use hot tubs, steam rooms, or saunas.  Do not douche. Do not use tampons or scented sanitary pads.  Do not use herbal remedies, alcohol, illegal drugs, or medicines that are not approved by your health care provider. Chemicals in these products can harm your baby.  Do not use any products that contain nicotine or tobacco, such as cigarettes, e-cigarettes, and chewing tobacco. If you need help quitting, ask your health care provider.  Avoid cat litter boxes and soil used by cats. These carry germs that can cause birth defects in the baby and possibly loss of the unborn baby (fetus) by miscarriage or stillbirth. General instructions  During routine prenatal visits in the first trimester, your health care provider will do a physical exam, perform necessary tests, and ask you how things are going. Keep all follow-up visits. This is important.  Ask for help if you have counseling or nutritional needs during pregnancy. Your health care provider can offer advice or refer you to specialists for help with various needs.  Schedule a dentist appointment. At home, brush your teeth with a soft toothbrush. Floss gently.  Write down your questions. Take them to your prenatal visits. Where to find more information  American Pregnancy Association: americanpregnancy.org  American College of Obstetricians and Gynecologists: acog.org/en/Womens%20Health/Pregnancy  Office on Women's Health:  womenshealth.gov/pregnancy Contact a health care provider if you have:  Dizziness.  A fever.  Mild pelvic cramps, pelvic pressure, or nagging pain in the abdominal area.  Nausea, vomiting, or diarrhea that lasts for 24 hours or longer.  A bad-smelling vaginal discharge.  Pain when you urinate.  Known exposure to a contagious illness, such as chickenpox, measles, Zika virus, HIV, or hepatitis. Get help right away if you have:  Spotting or bleeding from your vagina.  Severe abdominal cramping or pain.  Shortness of breath or chest pain.  Any kind of trauma, such as from a fall or a car crash.  New or increased pain, swelling, or redness in an arm or leg. Summary  The first trimester of pregnancy starts on the first day of your last menstrual period until the end of week 12 (months 1 through 3).  Eating 4 or 5 small meals a day rather than 3 large meals may help to relieve nausea and vomiting.  Do not use any   products that contain nicotine or tobacco, such as cigarettes, e-cigarettes, and chewing tobacco. If you need help quitting, ask your health care provider.  Keep all follow-up visits. This is important. This information is not intended to replace advice given to you by your health care provider. Make sure you discuss any questions you have with your health care provider. Document Revised: 04/10/2020 Document Reviewed: 02/15/2020 Elsevier Patient Education  2021 Elsevier Inc.   

## 2021-01-24 LAB — PROTEIN / CREATININE RATIO, URINE
Creatinine, Urine: 207.6 mg/dL
Protein, Ur: 19.5 mg/dL
Protein/Creat Ratio: 94 mg/g creat (ref 0–200)

## 2021-01-25 LAB — URINE CULTURE

## 2021-01-26 LAB — CERVICOVAGINAL ANCILLARY ONLY
Chlamydia: NEGATIVE
Comment: NEGATIVE
Comment: NORMAL
Neisseria Gonorrhea: NEGATIVE

## 2021-01-27 LAB — HGB FRACTIONATION CASCADE
Hgb A2: 2.1 % (ref 1.8–3.2)
Hgb A: 97.9 % (ref 96.4–98.8)
Hgb F: 0 % (ref 0.0–2.0)
Hgb S: 0 %

## 2021-01-27 LAB — COMPREHENSIVE METABOLIC PANEL
ALT: 8 IU/L (ref 0–32)
AST: 11 IU/L (ref 0–40)
Albumin/Globulin Ratio: 1.3 (ref 1.2–2.2)
Albumin: 4.3 g/dL (ref 3.9–5.0)
Alkaline Phosphatase: 69 IU/L (ref 42–106)
BUN/Creatinine Ratio: 12 (ref 9–23)
BUN: 7 mg/dL (ref 6–20)
Bilirubin Total: 0.7 mg/dL (ref 0.0–1.2)
CO2: 17 mmol/L — ABNORMAL LOW (ref 20–29)
Calcium: 9.5 mg/dL (ref 8.7–10.2)
Chloride: 100 mmol/L (ref 96–106)
Creatinine, Ser: 0.57 mg/dL (ref 0.57–1.00)
Globulin, Total: 3.3 g/dL (ref 1.5–4.5)
Glucose: 81 mg/dL (ref 65–99)
Potassium: 4 mmol/L (ref 3.5–5.2)
Sodium: 136 mmol/L (ref 134–144)
Total Protein: 7.6 g/dL (ref 6.0–8.5)
eGFR: 133 mL/min/{1.73_m2} (ref >59)

## 2021-01-27 LAB — RPR+RH+ABO+RUB AB+AB SCR+CB...
Antibody Screen: NEGATIVE
HIV Screen 4th Generation wRfx: NONREACTIVE
Hematocrit: 35.2 % (ref 34.0–46.6)
Hemoglobin: 11.2 g/dL (ref 11.1–15.9)
Hepatitis B Surface Ag: NEGATIVE
MCH: 28 pg (ref 26.6–33.0)
MCHC: 31.8 g/dL (ref 31.5–35.7)
MCV: 88 fL (ref 79–97)
Platelets: 385 10*3/uL (ref 150–450)
RBC: 4 x10E6/uL (ref 3.77–5.28)
RDW: 16.4 % — ABNORMAL HIGH (ref 11.7–15.4)
RPR Ser Ql: NONREACTIVE
Rh Factor: POSITIVE
Rubella Antibodies, IGG: 2.93 index (ref >0.99)
Varicella zoster IgG: 402 index (ref >165)
WBC: 6.3 10*3/uL (ref 3.4–10.8)

## 2021-01-29 ENCOUNTER — Other Ambulatory Visit: Payer: Self-pay

## 2021-01-29 ENCOUNTER — Emergency Department
Admission: EM | Admit: 2021-01-29 | Discharge: 2021-01-29 | Disposition: A | Discharge status: Home / Self Care | Payer: Medicaid Other | Attending: Student in an Organized Health Care Education/Training Program | Admitting: Student in an Organized Health Care Education/Training Program

## 2021-01-29 ENCOUNTER — Emergency Department: Payer: Medicaid Other

## 2021-01-29 DIAGNOSIS — Z3A08 8 weeks gestation of pregnancy: Secondary | ICD-10-CM | POA: Insufficient documentation

## 2021-01-29 DIAGNOSIS — O98311 Other infections with a predominantly sexual mode of transmission complicating pregnancy, first trimester: Secondary | ICD-10-CM | POA: Diagnosis present

## 2021-01-29 DIAGNOSIS — A599 Trichomoniasis, unspecified: Secondary | ICD-10-CM | POA: Insufficient documentation

## 2021-01-29 DIAGNOSIS — O26891 Other specified pregnancy related conditions, first trimester: Secondary | ICD-10-CM

## 2021-01-29 DIAGNOSIS — R102 Pelvic and perineal pain: Secondary | ICD-10-CM

## 2021-01-29 DIAGNOSIS — Z87891 Personal history of nicotine dependence: Secondary | ICD-10-CM | POA: Diagnosis not present

## 2021-01-29 DIAGNOSIS — O26899 Other specified pregnancy related conditions, unspecified trimester: Secondary | ICD-10-CM

## 2021-01-29 LAB — CBC
HCT: 33.3 % — ABNORMAL LOW (ref 36.0–46.0)
Hemoglobin: 11 g/dL — ABNORMAL LOW (ref 12.0–15.0)
MCH: 28.4 pg (ref 26.0–34.0)
MCHC: 33 g/dL (ref 30.0–36.0)
MCV: 86 fL (ref 80.0–100.0)
Platelets: 370 10*3/uL (ref 150–400)
RBC: 3.87 MIL/uL (ref 3.87–5.11)
RDW: 16.2 % — ABNORMAL HIGH (ref 11.5–15.5)
WBC: 8.3 10*3/uL (ref 4.0–10.5)
nRBC: 0 % (ref 0.0–0.2)

## 2021-01-29 LAB — COMPREHENSIVE METABOLIC PANEL
ALT: 12 U/L (ref 0–44)
AST: 14 U/L — ABNORMAL LOW (ref 15–41)
Albumin: 3.9 g/dL (ref 3.5–5.0)
Alkaline Phosphatase: 51 U/L (ref 38–126)
Anion gap: 9 (ref 5–15)
BUN: 11 mg/dL (ref 6–20)
CO2: 23 mmol/L (ref 22–32)
Calcium: 9.4 mg/dL (ref 8.9–10.3)
Chloride: 105 mmol/L (ref 98–111)
Creatinine, Ser: 0.54 mg/dL (ref 0.44–1.00)
GFR, Estimated: >60 mL/min (ref >60)
Glucose, Bld: 95 mg/dL (ref 70–99)
Potassium: 3.9 mmol/L (ref 3.5–5.1)
Sodium: 137 mmol/L (ref 135–145)
Total Bilirubin: 0.9 mg/dL (ref 0.3–1.2)
Total Protein: 7.6 g/dL (ref 6.5–8.1)

## 2021-01-29 LAB — URINALYSIS, COMPLETE (UACMP) WITH MICROSCOPIC
Bilirubin Urine: NEGATIVE
Glucose, UA: NEGATIVE mg/dL
Hgb urine dipstick: NEGATIVE
Nitrite: NEGATIVE
Protein, ur: NEGATIVE mg/dL
Specific Gravity, Urine: 1.025 (ref 1.005–1.030)
pH: 6.5 (ref 5.0–8.0)

## 2021-01-29 LAB — POC URINE PREG, ED
Preg Test, Ur: POSITIVE — AB
Preg Test, Ur: POSITIVE — AB

## 2021-01-29 LAB — HCG, QUANTITATIVE, PREGNANCY: hCG, Beta Chain, Quant, S: 111652 m[IU]/mL — ABNORMAL HIGH (ref <5)

## 2021-01-29 LAB — LIPASE, BLOOD: Lipase: 27 U/L (ref 11–51)

## 2021-01-29 LAB — ABO/RH: ABO/RH(D): O POS

## 2021-01-29 MED ORDER — METRONIDAZOLE 500 MG PO TABS: 500.0000 mg | ORAL_TABLET | Freq: Two times a day (BID) | ORAL | 0 refills | Status: DC

## 2021-01-29 NOTE — ED Notes (Signed)
FHR 125

## 2021-01-29 NOTE — Discharge Instructions (Signed)
Follow-up with your regular doctor about the trichomoniasis.  I do not want to give you treatment at this time as Flagyl is not safe in first trimester pregnancy.

## 2021-01-29 NOTE — ED Provider Notes (Cosign Needed Addendum)
[facility] Emergency Department Provider Note  ____________________________________________   Event Date/Time   First MD Initiated Contact with Patient 01/29/21 1416     (approximate)  I have reviewed the triage vital signs and the nursing notes.   HISTORY  Chief Complaint Abdominal Pain    HPI Joanne Pacheco is a 20 y.o. female presents emergency department C-spine [redacted] weeks pregnant she is having severe lower abdominal pain with cramping and pressure with pain rated at 10/10.  No vaginal bleeding.  States pain started about 2 days ago and has increasingly gotten worse.  1-2 episodes of vomiting and diarrhea yesterday.  Patient does not have a history of ectopics.  She did have one child diet 46-month-old that was born "brain dead "    Past Medical History:  Diagnosis Date  . Hyperemesis 02/08/2020  . Obesity     Patient Active Problem List   Diagnosis Date Noted  . Nausea/vomiting in pregnancy 01/23/2021  . History of cesarean section 01/23/2021  . Encounter for supervision of other normal pregnancy, first trimester 01/23/2021  . Short interval between pregnancies affecting pregnancy in first trimester, antepartum 01/23/2021  . Maternal obesity affecting pregnancy, antepartum 01/23/2021  . Abnormal LFTs 02/09/2020  . Obesity     Past Surgical History:  Procedure Laterality Date  . CESAREAN SECTION     07/18/2020  . CHOLECYSTECTOMY     2021 (at [redacted] weeks gestation)    Prior to Admission medications   Medication Sig Start Date End Date Taking? Authorizing Provider  metroNIDAZOLE (FLAGYL) 500 MG tablet Take 1 tablet (500 mg total) by mouth 2 (two) times daily for 7 days. 01/29/21 02/05/21 Yes Fisher, Roselyn Bering, PA-C  Doxylamine-Pyridoxine (DICLEGIS) 10-10 MG TBEC Take 2 tablets by mouth at bedtime. If symptoms persist, add one tablet in the morning and one in the afternoon 01/23/21   Zipporah Plants, CNM  Prenatal Vit-Fe Fumarate-FA  (MULTIVITAMIN-PRENATAL) 27-0.8 MG TABS tablet Take 1 tablet by mouth daily at 12 noon. 01/22/21 05/02/21  Federico Flake, MD    Allergies Patient has no known allergies.  No family history on file.  Social History Social History   Tobacco Use  . Smoking status: Former Smoker    Types: Cigarettes    Quit date: 01/14/2021    Years since quitting: 0.0  . Smokeless tobacco: Never Used  Vaping Use  . Vaping Use: Never used  Substance Use Topics  . Alcohol use: Not Currently    Comment: last use 01/02/21- one glass wine  . Drug use: Never    Review of Systems  Constitutional: No fever/chills Eyes: No visual changes. ENT: No sore throat. Respiratory: Denies cough Cardiovascular: Denies chest pain Gastrointestinal: Positive for abdominal pain Genitourinary: Negative for dysuria. Musculoskeletal: Negative for back pain. Skin: Negative for rash. Psychiatric: no mood changes,     ____________________________________________   PHYSICAL EXAM:  VITAL SIGNS: ED Triage Vitals  Enc Vitals Group     BP 01/29/21 1239 113/68     Pulse Rate 01/29/21 1239 77     Resp 01/29/21 1239 19     Temp 01/29/21 1239 98 F (36.7 C)     Temp src --      SpO2 01/29/21 1239 100 %     Weight 01/29/21 1236 245 lb (111.1 kg)     Height 01/29/21 1236 5\' 7"  (1.702 m)     Head Circumference --      Peak Flow --  Pain Score 01/29/21 1236 10     Pain Loc --      Pain Edu? --      Excl. in GC? --     Constitutional: Alert and oriented. Well appearing and in no acute distress. Eyes: Conjunctivae are normal.  Head: Atraumatic. Nose: No congestion/rhinnorhea. Mouth/Throat: Mucous membranes are moist.   Neck:  supple no lymphadenopathy noted Cardiovascular: Normal rate, regular rhythm. Heart sounds are normal Respiratory: Normal respiratory effort.  No retractions, lungs c t a  Abd: soft tender in all quadrants, bs normal all 4 quad GU: deferred Musculoskeletal: FROM all extremities,  warm and well perfused Neurologic:  Normal speech and language.  Skin:  Skin is warm, dry and intact. No rash noted. Psychiatric: Mood and affect are normal. Speech and behavior are normal.  ____________________________________________   LABS (all labs ordered are listed, but only abnormal results are displayed)  Labs Reviewed  COMPREHENSIVE METABOLIC PANEL - Abnormal; Notable for the following components:      Result Value   AST 14 (*)    All other components within normal limits  CBC - Abnormal; Notable for the following components:   Hemoglobin 11.0 (*)    HCT 33.3 (*)    RDW 16.2 (*)    All other components within normal limits  URINALYSIS, COMPLETE (UACMP) WITH MICROSCOPIC - Abnormal; Notable for the following components:   APPearance HAZY (*)    Ketones, ur TRACE (*)    Leukocytes,Ua SMALL (*)    Bacteria, UA FEW (*)    Trichomonas, UA PRESENT (*)    All other components within normal limits  HCG, QUANTITATIVE, PREGNANCY - Abnormal; Notable for the following components:   hCG, Beta Chain, Quant, S 709,628 (*)    All other components within normal limits  POC URINE PREG, ED - Abnormal; Notable for the following components:   Preg Test, Ur Positive (*)    All other components within normal limits  POC URINE PREG, ED - Abnormal; Notable for the following components:   Preg Test, Ur POSITIVE (*)    All other components within normal limits  LIPASE, BLOOD  ABO/RH   ____________________________________________   ____________________________________________  RADIOLOGY  Ultrasound OB less than 14 weeks with transvaginal  ____________________________________________   PROCEDURES  Procedure(s) performed: Fetal heart tones 125  Procedures    ____________________________________________   INITIAL IMPRESSION / ASSESSMENT AND PLAN / ED COURSE  Pertinent labs & imaging results that were available during my care of the patient were reviewed by me and considered  in my medical decision making (see chart for details).   Patient is 20 year old female presents as [redacted] weeks pregnant with lower abdominal pain.  See HPI physical exam shows patient per stable this time  DDx: IUP, round ligament pain, pain of implantation, ectopic, threatened miscarriage  CBC is normal , metabolic panel is normal, lipase normal, POC pregnancy is positive,  Urinalysis shows trichomoniasis, beta-hCG is 111,652, ABO/Rh is O+  Ultrasound shows a 7-week 5-day IUP.  I did explain everything to the patient.  She is to follow-up with her regular doctor.  Return emergency department worsening.  She is to have her partner treated.  Refrain from sex for 7 days after treatment.  She was discharged stable condition. Patient is to discuss the trichomoniasis with her doctor.  Flagyl may not be safe in first trimester pregnancy,  Called pharmacy to cancel the prescription    Juri Dinning was evaluated in Emergency Department on 01/29/2021 for  the symptoms described in the history of present illness. She was evaluated in the context of the global COVID-19 pandemic, which necessitated consideration that the patient might be at risk for infection with the SARS-CoV-2 virus that causes COVID-19. Institutional protocols and algorithms that pertain to the evaluation of patients at risk for COVID-19 are in a state of rapid change based on information released by regulatory bodies including the CDC and federal and state organizations. These policies and algorithms were followed during the patient's care in the ED.    As part of my medical decision making, I reviewed the following data within the electronic MEDICAL RECORD NUMBER Nursing notes reviewed and incorporated, Labs reviewed , Old chart reviewed, Radiograph reviewed , Notes from prior ED visits and [place] Controlled Substance Database  ____________________________________________   FINAL CLINICAL IMPRESSION(S) / ED DIAGNOSES  Final diagnoses:  Pelvic  pain during pregnancy  Abdominal pain during pregnancy in first trimester  Trichomoniasis      NEW MEDICATIONS STARTED DURING THIS VISIT:  New Prescriptions   METRONIDAZOLE (FLAGYL) 500 MG TABLET    Take 1 tablet (500 mg total) by mouth 2 (two) times daily for 7 days.     Note:  This document was prepared using Dragon voice recognition software and may include unintentional dictation errors.    Faythe Ghee, PA-C 01/29/21 1733    Faythe Ghee, PA-C 01/29/21 1739    Willy Eddy, MD 01/30/21 1141

## 2021-01-29 NOTE — ED Triage Notes (Addendum)
Pt comes with c/o severe cramping and pressure. Pt states some nausea and vomiting.  Pt states she is [redacted] weeks pregnant. Pt denies any vaginal bleeding. Pt states this is 2nd pregnancy.  Pt states this started couple days ago.

## 2021-01-29 NOTE — ED Notes (Signed)
See triage note. [redacted] weeks pregnant with lower abdominal cramping and pressure 10/10 pain rating, no bleeding at this time. Pain started about 2 days ago. Pt had a few episodes of vomiting and diarrhea yesterday. Pt states that her first baby died at 2 month old, was "born brain dead" and died in hospital. Pt is G2T1P1A0L0. Pt in NAD at this time.

## 2021-02-05 LAB — URINE DRUG PANEL 7
Amphetamines, Urine: NEGATIVE ng/mL
Barbiturate Quant, Ur: NEGATIVE ng/mL
Benzodiazepine Quant, Ur: NEGATIVE ng/mL
Cannabinoid Quant, Ur: POSITIVE — AB
Cocaine (Metab.): NEGATIVE ng/mL
Opiate Quant, Ur: NEGATIVE ng/mL
PCP Quant, Ur: NEGATIVE ng/mL

## 2021-02-05 LAB — INHERITEST CORE(CF97,SMA,FRAX)

## 2021-02-10 ENCOUNTER — Ambulatory Visit
Admission: RE | Admit: 2021-02-10 | Discharge: 2021-02-10 | Disposition: A | Discharge status: Home / Self Care | Payer: Medicaid Other | Source: Ambulatory Visit | Attending: Obstetrics and Gynecology | Admitting: Obstetrics and Gynecology

## 2021-02-10 ENCOUNTER — Other Ambulatory Visit: Payer: Self-pay | Admitting: Obstetrics and Gynecology

## 2021-02-10 ENCOUNTER — Other Ambulatory Visit: Payer: Self-pay

## 2021-02-10 DIAGNOSIS — N926 Irregular menstruation, unspecified: Secondary | ICD-10-CM | POA: Diagnosis present

## 2021-02-11 ENCOUNTER — Encounter: Payer: Medicaid Other | Admitting: Obstetrics and Gynecology

## 2021-02-11 ENCOUNTER — Other Ambulatory Visit: Payer: Medicaid Other

## 2021-02-11 ENCOUNTER — Encounter: Payer: Self-pay | Admitting: Obstetrics and Gynecology

## 2021-02-11 ENCOUNTER — Ambulatory Visit (INDEPENDENT_AMBULATORY_CARE_PROVIDER_SITE_OTHER): Payer: Medicaid Other | Admitting: Obstetrics and Gynecology

## 2021-02-11 VITALS — BP 120/70 | Ht 67.0 in | Wt 245.6 lb

## 2021-02-11 DIAGNOSIS — Z1379 Encounter for other screening for genetic and chromosomal anomalies: Secondary | ICD-10-CM

## 2021-02-11 DIAGNOSIS — Z3481 Encounter for supervision of other normal pregnancy, first trimester: Secondary | ICD-10-CM

## 2021-02-11 DIAGNOSIS — Z3A10 10 weeks gestation of pregnancy: Secondary | ICD-10-CM

## 2021-02-11 LAB — POCT URINALYSIS DIPSTICK OB
Glucose, UA: NEGATIVE
POC,PROTEIN,UA: NEGATIVE

## 2021-02-11 NOTE — Progress Notes (Signed)
Routine Prenatal Care Visit  Subjective  Joanne Pacheco is a 20 y.o. G2P1000 at [redacted]w[redacted]d being seen today for ongoing prenatal care.  She is currently monitored for the following issues for this low-risk pregnancy and has Abnormal LFTs; Obesity; Nausea/vomiting in pregnancy; History of cesarean section; Encounter for supervision of other normal pregnancy, first trimester; Short interval between pregnancies affecting pregnancy in first trimester, antepartum; and Maternal obesity affecting pregnancy, antepartum on their problem list.  ----------------------------------------------------------------------------------- Patient reports nausea.   Contractions: Not present. Vag. Bleeding: None.  Movement: Absent. Denies leaking of fluid.  ----------------------------------------------------------------------------------- The following portions of the patient's history were reviewed and updated as appropriate: allergies, current medications, past family history, past medical history, past social history, past surgical history and problem list. Problem list updated.   Objective  Blood pressure 120/70, height 5\' 7"  (1.702 m), weight 245 lb 9.6 oz (111.4 kg), last menstrual period 12/03/2020. [name] [name] not on file Total Weight Gain Not found. Urinalysis:      Fetal Status: Fetal Heart Rate (bpm): 156   Movement: Absent     General:  Alert, oriented and cooperative. Patient is in no acute distress.  Skin: Skin is warm and dry. No rash noted.   Cardiovascular: Normal heart rate noted  Respiratory: Normal respiratory effort, no problems with respiration noted  Abdomen: Soft, gravid, appropriate for gestational age. Pain/Pressure: Absent     Pelvic:  Cervical exam deferred        Extremities: Normal range of motion.     Mental Status: Normal mood and affect. Normal behavior. Normal judgment and thought content.     Assessment   [name] by  09/09/2021, Date  entered prior to episode creation presenting for routine prenatal visit  Plan   pregnancy 2  Problems (from 01/23/21 to present)    Problem Noted Resolved   Encounter for supervision of other normal pregnancy, first trimester 01/23/2021 by 03/25/2021, CNM No   Overview Addendum 02/11/2021 12:13 PM by 02/13/2021, MD     Nursing Staff Provider  Office Location  [facility] Dating   LMP  Language  English Anatomy Natale Milch    Flu Vaccine   declined Genetic Screen  NIPS:   desires  TDaP vaccine    Hgb A1C or  GTT Early : Third trimester :   Rhogam   n/a   LAB RESULTS   Feeding Plan  breast Blood Type --/--/O POS (03/25 0855)   Contraception  Antibody NEG (03/25 0855)  Circumcision  Rubella 1.83 (03/26 2027)  Pediatrician    RPR NON REACTIVE (03/26 2027)   Support Person  [name] HBsAg NON REACTIVE (03/25 1046)   Prenatal Classes   HIV NON REACTIVE (03/26 0532)    Varicella  immune  BTL Consent  n/a GBS  (For PCN allergy, check sensitivities)        VBAC Consent  desires Pap  n/a <21 yo    Hgb Electro   normal adult hgb    CF  negative   COVID-19  declined vax  had in 9/21 SMA  negative         History of preeclampsia History of cesarean, desires TOLAC       Previous Version       Gestational age appropriate obstetric precautions including but not limited to vaginal bleeding, contractions, leaking of fluid and fetal movement were reviewed in detail with the patient.    Return in about  2 weeks (around 02/25/2021) for ROB in person.  Natale Milch MD [facility], [facility] 02/11/2021, 12:18 PM

## 2021-02-11 NOTE — Patient Instructions (Signed)
Initial steps to help :   B6 (pyridoxine) 25 mg,  3-4 times a day- 200 mg a day total Unisom (doxylamine) 25 mg at bedtime **B6 and Unisom are available as a combination prescription medications called diclegis and bonjesta  B1 (thiamin)  50-100 mg 1-2 a day-  100 mg a day total  Continue prenatal vitamin with iron and thiamin. If it is not tolerated switch to 1 mg of folic acid.  Can add medication for gastric reflux if needed.  Subsequent steps to be added to B1, B6, and Unisom:  1. Antihistamine (one of the following medications) Dramamine      25-50 mg every 4-6 hours Benadryl      25-50 mg every 4-6 hours Meclizine      25 mg every 6 hours  2. Dopamine Antagonist (one of the following medications) Metoclopramide  (Reglan)  5-10 mg every 6-8 hours         PO Promethazine   (Phenergan)   12.5-25 mg every 4-6 hours      PO or rectal Prochlorperazine  (Compazine)  5-10 mg every 6-8 hours     69m BID rectally   Subsequent steps if there has still not been improvement in symptoms:  3. Daily stool softner:  Colace 100 mg twice a day  4. Ondansetron  (Zofran)   4-8 mg every 6-8 hours       First Trimester of Pregnancy  The first trimester of pregnancy starts on the first day of your last menstrual period until the end of week 12. This is months 1 through 3 of pregnancy. A week after a sperm fertilizes an egg, the egg will implant into the wall of the uterus and begin to develop into a baby. By the end of 12 weeks, all the baby's organs will be formed and the baby will be 2-3 inches in size. Body changes during your first trimester Your body goes through many changes during pregnancy. The changes vary and generally return to normal after your baby is born. Physical changes  You may gain or lose weight.  Your breasts may begin to grow larger and become tender. The tissue that surrounds your nipples (areola) may become darker.  Dark spots or blotches (chloasma or mask of  pregnancy) may develop on your face.  You may have changes in your hair. These can include thickening or thinning of your hair or changes in texture. Health changes  You may feel nauseous, and you may vomit.  You may have heartburn.  You may develop headaches.  You may develop constipation.  Your gums may bleed and may be sensitive to brushing and flossing. Other changes  You may tire easily.  You may urinate more often.  Your menstrual periods will stop.  You may have a loss of appetite.  You may develop cravings for certain kinds of food.  You may have changes in your emotions from day to day.  You may have more vivid and strange dreams. Follow these instructions at home: Medicines  Follow your health care provider's instructions regarding medicine use. Specific medicines may be either safe or unsafe to take during pregnancy. Do not take any medicines unless told to by your health care provider.  Take a prenatal vitamin that contains at least 600 micrograms (mcg) of folic acid. Eating and drinking  Eat a healthy diet that includes fresh fruits and vegetables, whole grains, good sources of protein such as meat, eggs, or tofu, and low-fat dairy products.  Avoid raw meat and unpasteurized juice, milk, and cheese. These carry germs that can harm you and your baby.  If you feel nauseous or you vomit: ? Eat 4 or 5 small meals a day instead of 3 large meals. ? Try eating a few soda crackers. ? Drink liquids between meals instead of during meals.  You may need to take these actions to prevent or treat constipation: ? Drink enough fluid to keep your urine pale yellow. ? Eat foods that are high in fiber, such as beans, whole grains, and fresh fruits and vegetables. ? Limit foods that are high in fat and processed sugars, such as fried or sweet foods. Activity  Exercise only as directed by your health care provider. Most people can continue their usual exercise routine  during pregnancy. Try to exercise for 30 minutes at least 5 days a week.  Stop exercising if you develop pain or cramping in the lower abdomen or lower back.  Avoid exercising if it is very hot or humid or if you are at high altitude.  Avoid heavy lifting.  If you choose to, you may have sex unless your health care provider tells you not to. Relieving pain and discomfort  Wear a good support bra to relieve breast tenderness.  Rest with your legs elevated if you have leg cramps or low back pain.  If you develop bulging veins (varicose veins) in your legs: ? Wear support hose as told by your health care provider. ? Elevate your feet for 15 minutes, 3-4 times a day. ? Limit salt in your diet. Safety  Wear your seat belt at all times when driving or riding in a car.  Talk with your health care provider if someone is verbally or physically abusive to you.  Talk with your health care provider if you are feeling sad or have thoughts of hurting yourself. Lifestyle  Do not use hot tubs, steam rooms, or saunas.  Do not douche. Do not use tampons or scented sanitary pads.  Do not use herbal remedies, alcohol, illegal drugs, or medicines that are not approved by your health care provider. Chemicals in these products can harm your baby.  Do not use any products that contain nicotine or tobacco, such as cigarettes, e-cigarettes, and chewing tobacco. If you need help quitting, ask your health care provider.  Avoid cat litter boxes and soil used by cats. These carry germs that can cause birth defects in the baby and possibly loss of the unborn baby (fetus) by miscarriage or stillbirth. General instructions  During routine prenatal visits in the first trimester, your health care provider will do a physical exam, perform necessary tests, and ask you how things are going. Keep all follow-up visits. This is important.  Ask for help if you have counseling or nutritional needs during pregnancy.  Your health care provider can offer advice or refer you to specialists for help with various needs.  Schedule a dentist appointment. At home, brush your teeth with a soft toothbrush. Floss gently.  Write down your questions. Take them to your prenatal visits. Where to find more information  American Pregnancy Association: americanpregnancy.[place] and Gynecologists: PoolDevices.com.pt  Office on [employer]: KeywordPortfolios.com.br Contact a health care provider if you have:  Dizziness.  A fever.  Mild pelvic cramps, pelvic pressure, or nagging pain in the abdominal area.  Nausea, vomiting, or diarrhea that lasts for 24 hours or longer.  A bad-smelling vaginal discharge.  Pain when you  urinate.  Known exposure to a contagious illness, such as chickenpox, measles, Zika virus, HIV, or hepatitis. Get help right away if you have:  Spotting or bleeding from your vagina.  Severe abdominal cramping or pain.  Shortness of breath or chest pain.  Any kind of trauma, such as from a fall or a car crash.  New or increased pain, swelling, or redness in an arm or leg. Summary  The first trimester of pregnancy starts on the first day of your last menstrual period until the end of week 12 (months 1 through 3).  Eating 4 or 5 small meals a day rather than 3 large meals may help to relieve nausea and vomiting.  Do not use any products that contain nicotine or tobacco, such as cigarettes, e-cigarettes, and chewing tobacco. If you need help quitting, ask your health care provider.  Keep all follow-up visits. This is important. This information is not intended to replace advice given to you by your health care provider. Make sure you discuss any questions you have with your health care provider. Document Revised: 04/10/2020 Document Reviewed: 02/15/2020 Elsevier Patient Education  2021 [employer].

## 2021-02-12 LAB — GLUCOSE, 1 HOUR GESTATIONAL: Gestational Diabetes Screen: 65 mg/dL (ref 65–139)

## 2021-02-18 LAB — MATERNIT21 PLUS CORE+SCA
Fetal Fraction: 6
Monosomy X (Turner Syndrome): NOT DETECTED
Result (T21): NEGATIVE
Trisomy 13 (Patau syndrome): NEGATIVE
Trisomy 18 (Edwards syndrome): NEGATIVE
Trisomy 21 (Down syndrome): NEGATIVE
XXX (Triple X Syndrome): NOT DETECTED
XXY (Klinefelter Syndrome): NOT DETECTED
XYY (Jacobs Syndrome): NOT DETECTED

## 2021-02-25 ENCOUNTER — Other Ambulatory Visit: Payer: Self-pay

## 2021-02-25 ENCOUNTER — Ambulatory Visit (INDEPENDENT_AMBULATORY_CARE_PROVIDER_SITE_OTHER): Payer: Medicaid Other | Admitting: Obstetrics

## 2021-02-25 VITALS — BP 128/70 | Wt 242.0 lb

## 2021-02-25 DIAGNOSIS — Z3A12 12 weeks gestation of pregnancy: Secondary | ICD-10-CM

## 2021-02-25 DIAGNOSIS — Z3481 Encounter for supervision of other normal pregnancy, first trimester: Secondary | ICD-10-CM

## 2021-02-25 NOTE — Progress Notes (Signed)
ROB- no concerns 

## 2021-02-25 NOTE — Addendum Note (Signed)
Addended by: Donnetta Hail on: 02/25/2021 11:31 AM   Modules accepted: Orders

## 2021-02-25 NOTE — Progress Notes (Signed)
Routine Prenatal Care Visit  Subjective  Joanne Pacheco is a 20 y.o. G2P1000 at [redacted]w[redacted]d being seen today for ongoing prenatal care.  She is currently monitored for the following issues for this high-risk pregnancy and has Abnormal LFTs; Obesity; Nausea/vomiting in pregnancy; History of cesarean section; Encounter for supervision of other normal pregnancy, first trimester; Short interval between pregnancies affecting pregnancy in first trimester, antepartum; and Maternal obesity affecting pregnancy, antepartum on their problem list.  ----------------------------------------------------------------------------------- Patient reports no complaints.    .  .   Pincus Large Fluid denies.  ----------------------------------------------------------------------------------- The following portions of the patient's history were reviewed and updated as appropriate: allergies, current medications, past family history, past medical history, past social history, past surgical history and problem list. Problem list updated.  Objective  Blood pressure 128/70, weight 242 lb (109.8 kg), last menstrual period 12/03/2020. [name] 245 lb (111.1 kg) Total Weight Gain -3 lb (-1.361 kg) Urinalysis: Urine Protein    Urine Glucose    Fetal Status:           General:  Alert, oriented and cooperative. Patient is in no acute distress.  Skin: Skin is warm and dry. No rash noted.   Cardiovascular: Normal heart rate noted  Respiratory: Normal respiratory effort, no problems with respiration noted  Abdomen: Soft, gravid, appropriate for gestational age.       Pelvic:  Cervical exam deferred        Extremities: Normal range of motion.     Mental Status: Normal mood and affect. Normal behavior. Normal judgment and thought content.   Assessment   [name] by  09/09/2021, Date entered prior to episode creation presenting for routine prenatal visit  Plan   pregnancy 2  Problems (from 01/23/21 to  present)    Problem Noted Resolved   Encounter for supervision of other normal pregnancy, first trimester 01/23/2021 by Zipporah Plants, CNM No   Overview Addendum 02/14/2021 11:31 AM by Zipporah Plants, CNM     Nursing Staff Provider  Office Location  [facility] Dating   LMP = 10 wk [place]  Language  English Anatomy US    Flu Vaccine   declined Genetic Screen  NIPS:   desires  TDaP vaccine    Hgb A1C or  GTT Early : 65 Third trimester :   Rhogam   n/a   LAB RESULTS   Feeding Plan  breast Blood Type --/--/O POS (03/25 0855)   Contraception  Antibody NEG (03/25 0855)  Circumcision  Rubella 1.83 (03/26 2027)  Pediatrician    RPR NON REACTIVE (03/26 2027)   Support Person  [name] HBsAg NON REACTIVE (03/25 1046)   Prenatal Classes   HIV NON REACTIVE (03/26 0532)    Varicella  immune  BTL Consent  n/a GBS  (For PCN allergy, check sensitivities)        VBAC Consent  desires Pap  n/a <21 yo    Hgb Electro   normal adult hgb    CF  negative   COVID-19  declined vax  had in 9/21 SMA  negative         History of preeclampsia History of cesarean, desires TOLAC       Previous Version       Preterm labor symptoms and general obstetric precautions including but not limited to vaginal bleeding, contractions, leaking of fluid and fetal movement were reviewed in detail with the patient. Please refer to After Visit Summary for other counseling recommendations.  Return in about 4 weeks (around 03/25/2021) for return OB.  Mirna Mires, CNM  02/25/2021 11:03 AM

## 2021-02-28 ENCOUNTER — Emergency Department
Admission: EM | Admit: 2021-02-28 | Discharge: 2021-02-28 | Disposition: A | Discharge status: Home / Self Care | Payer: Medicaid Other | Attending: Emergency Medicine | Admitting: Emergency Medicine

## 2021-02-28 ENCOUNTER — Encounter: Payer: Self-pay | Admitting: Emergency Medicine

## 2021-02-28 ENCOUNTER — Other Ambulatory Visit: Payer: Self-pay

## 2021-02-28 ENCOUNTER — Emergency Department: Payer: Medicaid Other

## 2021-02-28 DIAGNOSIS — R079 Chest pain, unspecified: Secondary | ICD-10-CM | POA: Insufficient documentation

## 2021-02-28 DIAGNOSIS — O99611 Diseases of the digestive system complicating pregnancy, first trimester: Secondary | ICD-10-CM | POA: Insufficient documentation

## 2021-02-28 DIAGNOSIS — K2970 Gastritis, unspecified, without bleeding: Secondary | ICD-10-CM | POA: Diagnosis not present

## 2021-02-28 DIAGNOSIS — Z87891 Personal history of nicotine dependence: Secondary | ICD-10-CM | POA: Insufficient documentation

## 2021-02-28 DIAGNOSIS — Z3A12 12 weeks gestation of pregnancy: Secondary | ICD-10-CM | POA: Diagnosis not present

## 2021-02-28 LAB — BASIC METABOLIC PANEL
Anion gap: 8 (ref 5–15)
BUN: 8 mg/dL (ref 6–20)
CO2: 21 mmol/L — ABNORMAL LOW (ref 22–32)
Calcium: 9.1 mg/dL (ref 8.9–10.3)
Chloride: 103 mmol/L (ref 98–111)
Creatinine, Ser: 0.54 mg/dL (ref 0.44–1.00)
GFR, Estimated: >60 mL/min (ref >60)
Glucose, Bld: 89 mg/dL (ref 70–99)
Potassium: 3.4 mmol/L — ABNORMAL LOW (ref 3.5–5.1)
Sodium: 132 mmol/L — ABNORMAL LOW (ref 135–145)

## 2021-02-28 LAB — CBC
HCT: 30.6 % — ABNORMAL LOW (ref 36.0–46.0)
Hemoglobin: 10.6 g/dL — ABNORMAL LOW (ref 12.0–15.0)
MCH: 29.3 pg (ref 26.0–34.0)
MCHC: 34.6 g/dL (ref 30.0–36.0)
MCV: 84.5 fL (ref 80.0–100.0)
Platelets: 324 10*3/uL (ref 150–400)
RBC: 3.62 MIL/uL — ABNORMAL LOW (ref 3.87–5.11)
RDW: 14.9 % (ref 11.5–15.5)
WBC: 6.1 10*3/uL (ref 4.0–10.5)
nRBC: 0 % (ref 0.0–0.2)

## 2021-02-28 LAB — URINALYSIS, COMPLETE (UACMP) WITH MICROSCOPIC
Bilirubin Urine: NEGATIVE
Glucose, UA: NEGATIVE mg/dL
Hgb urine dipstick: NEGATIVE
Ketones, ur: 20 mg/dL — AB
Nitrite: NEGATIVE
Protein, ur: NEGATIVE mg/dL
Specific Gravity, Urine: 1.011 (ref 1.005–1.030)
pH: 6 (ref 5.0–8.0)

## 2021-02-28 LAB — HEPATIC FUNCTION PANEL
ALT: 18 U/L (ref 0–44)
AST: 17 U/L (ref 15–41)
Albumin: 3.7 g/dL (ref 3.5–5.0)
Alkaline Phosphatase: 41 U/L (ref 38–126)
Bilirubin, Direct: 0.1 mg/dL (ref 0.0–0.2)
Indirect Bilirubin: 0.8 mg/dL (ref 0.3–0.9)
Total Bilirubin: 0.9 mg/dL (ref 0.3–1.2)
Total Protein: 7.3 g/dL (ref 6.5–8.1)

## 2021-02-28 LAB — LIPASE, BLOOD: Lipase: 29 U/L (ref 11–51)

## 2021-02-28 LAB — POC URINE PREG, ED: Preg Test, Ur: POSITIVE — AB

## 2021-02-28 LAB — TROPONIN I (HIGH SENSITIVITY)
Troponin I (High Sensitivity): <2 ng/L (ref <18)
Troponin I (High Sensitivity): <2 ng/L (ref <18)

## 2021-02-28 MED ORDER — FAMOTIDINE IN NACL 20-0.9 MG/50ML-% IV SOLN
20.0000 mg | Freq: Once | INTRAVENOUS | Status: AC
  Administered 2021-02-28: 20 mg via INTRAVENOUS
  Filled 2021-02-28: qty 50

## 2021-02-28 MED ORDER — ALUM & MAG HYDROXIDE-SIMETH 200-200-20 MG/5ML PO SUSP
30.0000 mL | Freq: Once | ORAL | Status: AC
  Administered 2021-02-28: 30 mL via ORAL
  Filled 2021-02-28: qty 30

## 2021-02-28 MED ORDER — SODIUM CHLORIDE 0.9 % IV SOLN: Freq: Once | INTRAVENOUS | Status: AC

## 2021-02-28 NOTE — ED Notes (Signed)
EDP Joanne Pacheco to bedside. Pt reports dec in CP.

## 2021-02-28 NOTE — ED Triage Notes (Signed)
Pt to ED via [facility] with c/o constant chest pressure that she describes as unbearable. Per EMS pt is [redacted] weeks pregnant at this time. Per EMS NSR on the EKG, pain started this morning and has progressed throughout the shift.

## 2021-02-28 NOTE — ED Notes (Signed)
Pt given urine specimen cup. Pt states thinks she can provide sample soon. EDP Isaacs to bedside.

## 2021-02-28 NOTE — ED Notes (Signed)
See triage note. Pt laying calmly in bed; resp reg/unlabored. Skin dry. Reports vomited once today when pain initially started at R side of chest. Reports sometimes makes her [facility] but fluctuates. Reports history of anxiety.

## 2021-02-28 NOTE — Discharge Instructions (Addendum)
Please seek medical attention for any high fevers, chest pain, shortness of breath, change in behavior, persistent vomiting, bloody stool or any other new or concerning symptoms.  

## 2021-02-28 NOTE — ED Provider Notes (Signed)
[facility] Emergency Department Provider Note  ____________________________________________   Event Date/Time   First MD Initiated Contact with Patient 02/28/21 1420     (approximate)  I have reviewed the triage vital signs and the nursing notes.   HISTORY  Chief Complaint Chest Pain    HPI Joanne Pacheco is a 20 y.o. female here with chest pain.  The patient is currently [redacted] weeks pregnant.  She has a history of chronic nausea and vomiting, worse during pregnancy.  She states that earlier today, she vomited this morning.  She went to work.  While at work, she developed a sharp substernal chest pain, worse when she bent down or took a deep breath.  She states that when she lays flat it is more of a pressure-like and burning sensation.  Pain has been fairly constant.  She has a history of similar chest pain in the setting of vomiting due to her previous hyperemesis gravidarum.  Denies any blood in her emesis.  Denies any fevers or chills.  No significant current shortness of breath but does have shortness of breath when the pain is severe.  No lower extremity swelling.  No history of DVT or PE, particularly during her previous pregnancy.  No other complaints.        Past Medical History:  Diagnosis Date  . Hyperemesis 02/08/2020  . Obesity     Patient Active Problem List   Diagnosis Date Noted  . Nausea/vomiting in pregnancy 01/23/2021  . History of cesarean section 01/23/2021  . Encounter for supervision of other normal pregnancy, first trimester 01/23/2021  . Short interval between pregnancies affecting pregnancy in first trimester, antepartum 01/23/2021  . Maternal obesity affecting pregnancy, antepartum 01/23/2021  . Abnormal LFTs 02/09/2020  . Obesity     Past Surgical History:  Procedure Laterality Date  . CESAREAN SECTION     07/18/2020  . CHOLECYSTECTOMY     2021 (at [redacted] weeks gestation)    Prior to Admission medications   Medication Sig  Start Date End Date Taking? Authorizing Provider  Prenatal Vit-Fe Fumarate-FA (MULTIVITAMIN-PRENATAL) 27-0.8 MG TABS tablet Take 1 tablet by mouth daily at 12 noon. 01/22/21 05/02/21  Federico Flake, MD    Allergies Patient has no known allergies.  No family history on file.  Social History Social History   Tobacco Use  . Smoking status: Former Smoker    Types: Cigarettes    Quit date: 01/14/2021    Years since quitting: 0.1  . Smokeless tobacco: Never Used  Vaping Use  . Vaping Use: Never used  Substance Use Topics  . Alcohol use: Not Currently    Comment: last use 01/02/21- one glass wine  . Drug use: Never    Review of Systems  Review of Systems  Constitutional: Negative for chills and fever.  HENT: Negative for sore throat.   Respiratory: Positive for chest tightness. Negative for shortness of breath.   Cardiovascular: Positive for chest pain.  Gastrointestinal: Negative for abdominal pain.  Genitourinary: Negative for flank pain.  Musculoskeletal: Negative for neck pain.  Skin: Negative for rash and wound.  Allergic/Immunologic: Negative for immunocompromised state.  Neurological: Negative for weakness and numbness.  Hematological: Does not bruise/bleed easily.  All other systems reviewed and are negative.    ____________________________________________  PHYSICAL EXAM:      VITAL SIGNS: ED Triage Vitals  Enc Vitals Group     BP 02/28/21 1340 117/60     Pulse Rate 02/28/21 1340 63  Resp 02/28/21 1340 16     Temp 02/28/21 1345 98.9 F (37.2 C)     Temp Source 02/28/21 1345 Oral     SpO2 02/28/21 1340 99 %     Weight 02/28/21 1341 245 lb (111.1 kg)     Height 02/28/21 1341 5\' 6"  (1.676 m)     Head Circumference --      Peak Flow --      Pain Score 02/28/21 1340 6     Pain Loc --      Pain Edu? --      Excl. in GC? --      Physical Exam Vitals and nursing note reviewed.  Constitutional:      General: She is not in acute distress.     Appearance: She is well-developed.  HENT:     Head: Normocephalic and atraumatic.  Eyes:     Conjunctiva/sclera: Conjunctivae normal.  Cardiovascular:     Rate and Rhythm: Normal rate and regular rhythm.     Heart sounds: Normal heart sounds. No murmur heard. No friction rub.  Pulmonary:     Effort: Pulmonary effort is normal. No respiratory distress.     Breath sounds: Normal breath sounds. No wheezing or rales.  Abdominal:     General: There is no distension.     Palpations: Abdomen is soft.     Tenderness: There is abdominal tenderness in the epigastric area.     Comments: Mild epigastric TTP. No RUQ TTP. No rebound or guarding.   Musculoskeletal:     Cervical back: Neck supple.  Skin:    General: Skin is warm.     Capillary Refill: Capillary refill takes less than 2 seconds.  Neurological:     Mental Status: She is alert and oriented to person, place, and time.     Motor: No abnormal muscle tone.       ____________________________________________   LABS (all labs ordered are listed, but only abnormal results are displayed)  Labs Reviewed  BASIC METABOLIC PANEL - Abnormal; Notable for the following components:      Result Value   Sodium 132 (*)    Potassium 3.4 (*)    CO2 21 (*)    All other components within normal limits  CBC - Abnormal; Notable for the following components:   RBC 3.62 (*)    Hemoglobin 10.6 (*)    HCT 30.6 (*)    All other components within normal limits  URINALYSIS, COMPLETE (UACMP) WITH MICROSCOPIC - Abnormal; Notable for the following components:   Color, Urine YELLOW (*)    APPearance CLOUDY (*)    Ketones, ur 20 (*)    Leukocytes,Ua LARGE (*)    Bacteria, UA RARE (*)    All other components within normal limits  POC URINE PREG, ED - Abnormal; Notable for the following components:   Preg Test, Ur POSITIVE (*)    All other components within normal limits  URINE CULTURE  HEPATIC FUNCTION PANEL  LIPASE, BLOOD  TROPONIN I (HIGH  SENSITIVITY)  TROPONIN I (HIGH SENSITIVITY)    ____________________________________________  EKG: Normal sinus rhythm, ventricular rate 82.  QRS 82, QTc 434.  No acute ST elevations or depressions.  No EKG evidence of acute ischemia or infarct. ________________________________________  RADIOLOGY All imaging, including plain films, CT scans, and ultrasounds, independently reviewed by me, and interpretations confirmed via formal radiology reads.  ED MD interpretation:   CXR: Clear  Official radiology report(s): DG Chest 2 View  Result  Date: 02/28/2021 CLINICAL DATA:  Chest pain. EXAM: CHEST - 2 VIEW COMPARISON:  Chest x-ray 07/09/2020, CT chest 03/08/2020 FINDINGS: The heart size and mediastinal contours are within normal limits. No focal consolidation. No pulmonary edema. No pleural effusion. No pneumothorax. No acute osseous abnormality. IMPRESSION: No active cardiopulmonary disease. Electronically Signed   By: Tish Frederickson M.D.   On: 02/28/2021 15:14    ____________________________________________  PROCEDURES   Procedure(s) performed (including Critical Care):  Procedures  ____________________________________________  INITIAL IMPRESSION / MDM / ASSESSMENT AND PLAN / ED COURSE  As part of my medical decision making, I reviewed the following data within the electronic MEDICAL RECORD NUMBER Nursing notes reviewed and incorporated, Old chart reviewed, Notes from prior ED visits, and [place] Controlled Substance Database       *Joanne Pacheco was evaluated in Emergency Department on 02/28/2021 for the symptoms described in the history of present illness. She was evaluated in the context of the global COVID-19 pandemic, which necessitated consideration that the patient might be at risk for infection with the SARS-CoV-2 virus that causes COVID-19. Institutional protocols and algorithms that pertain to the evaluation of patients at risk for COVID-19 are in a state of rapid change based on  information released by regulatory bodies including the CDC and federal and state organizations. These policies and algorithms were followed during the patient's care in the ED.  Some ED evaluations and interventions may be delayed as a result of limited staffing during the pandemic.*     Medical Decision Making:  20 yo F currently [redacted] wk pregnant here with chest pain. CP began after vomiting, and pt has long documented h/o recurrent n/v, gastritis and subsequent CP. Suspect gastritis/GERD or esophagitis 2/2 hyperemesis. Pt is otherwise well appearing and in NAD. EKG is normal and trop neg despite constant sx >12 hr, doubt ACS. No LE edema, tachycardia, tachypnea, hypoxia, cough, or s/s DVT or PE. Labs show likely mild dehydration with Co2 21 but otherwise are unremarkable. CBC without leukocytosis. Mild anemia noted. No bleeding. LFTs, bili wnl. Will give fluids, antacids. Plan to f/u urine, likely d/c if sx improved and tolerating PO.  ____________________________________________  FINAL CLINICAL IMPRESSION(S) / ED DIAGNOSES  Final diagnoses:  Gastritis, presence of bleeding unspecified, unspecified chronicity, unspecified gastritis type     MEDICATIONS GIVEN DURING THIS VISIT:  Medications  famotidine (PEPCID) IVPB 20 mg premix (0 mg Intravenous Stopped 02/28/21 1648)  alum & mag hydroxide-simeth (MAALOX/MYLANTA) 200-200-20 MG/5ML suspension 30 mL (30 mLs Oral Given 02/28/21 1606)  0.9 %  sodium chloride infusion (0 mLs Intravenous Stopped 02/28/21 1709)     ED Discharge Orders    None       Note:  This document was prepared using Dragon voice recognition software and may include unintentional dictation errors.   Shaune Pollack, MD 02/28/21 [phone]

## 2021-02-28 NOTE — ED Triage Notes (Signed)
Pt to ED via [facility] from work for chest pain. Pt states that the pain started this morning. Pt states that she woke up with the pain. Pain got worse throughout the day. Pt states that the pain is located on the right side of her chest and does not radiate. Pt denies any other symptoms. Pt states that she thinks it may be related to anxiety and depression due to losing her son in October when he was 44 month old. Pt states that she is currently [redacted] weeks pregnant.

## 2021-02-28 NOTE — ED Notes (Signed)
Called lab to notify to add on urine culture. Lab states they'll do so now.

## 2021-02-28 NOTE — ED Provider Notes (Signed)
Patient felt improvement after GI cocktail. Urine with some signs concerning for possible infection however some contaminant. Patient denies any dysuria or bad odor to myself. At this time will send for culture. Second troponin negative. Discussed with patient importance of follow up with ob/gyn.   Phineas Semen, MD 02/28/21 [phone]

## 2021-03-02 LAB — URINE CULTURE: Culture: >=100000 — AB

## 2021-03-25 ENCOUNTER — Encounter: Payer: Self-pay | Admitting: Advanced Practice Midwife

## 2021-03-25 ENCOUNTER — Other Ambulatory Visit: Payer: Self-pay

## 2021-03-25 ENCOUNTER — Ambulatory Visit (INDEPENDENT_AMBULATORY_CARE_PROVIDER_SITE_OTHER): Payer: Medicaid Other | Admitting: Advanced Practice Midwife

## 2021-03-25 VITALS — BP 124/68 | Wt 242.0 lb

## 2021-03-25 DIAGNOSIS — Z369 Encounter for antenatal screening, unspecified: Secondary | ICD-10-CM

## 2021-03-25 DIAGNOSIS — Z3482 Encounter for supervision of other normal pregnancy, second trimester: Secondary | ICD-10-CM

## 2021-03-25 DIAGNOSIS — Z3A16 16 weeks gestation of pregnancy: Secondary | ICD-10-CM

## 2021-03-25 LAB — POCT URINALYSIS DIPSTICK OB
Glucose, UA: NEGATIVE
POC,PROTEIN,UA: NEGATIVE

## 2021-03-25 NOTE — Progress Notes (Signed)
Routine Prenatal Care Visit  Subjective  Joanne Pacheco is a 20 y.o. G2P1000 at [redacted]w[redacted]d being seen today for ongoing prenatal care.  She is currently monitored for the following issues for this low-risk pregnancy and has Abnormal LFTs; Obesity; Nausea/vomiting in pregnancy; History of cesarean section; Encounter for supervision of other normal pregnancy, first trimester; Short interval between pregnancies affecting pregnancy in first trimester, antepartum; and Maternal obesity affecting pregnancy, antepartum on their problem list.  ----------------------------------------------------------------------------------- Patient reports no complaints.   Contractions: Not present. Vag. Bleeding: None.  Movement: Present. Leaking Fluid denies.  ----------------------------------------------------------------------------------- The following portions of the patient's history were reviewed and updated as appropriate: allergies, current medications, past family history, past medical history, past social history, past surgical history and problem list. Problem list updated.  Objective  Blood pressure 124/68, weight 242 lb (109.8 kg), last menstrual period 12/03/2020. [name] 245 lb (111.1 kg) Total Weight Gain -3 lb (-1.361 kg) Urinalysis: Urine Protein Negative  Urine Glucose Negative  Fetal Status: Fetal Heart Rate (bpm): 152   Movement: Present     General:  Alert, oriented and cooperative. Patient is in no acute distress.  Skin: Skin is warm and dry. No rash noted.   Cardiovascular: Normal heart rate noted  Respiratory: Normal respiratory effort, no problems with respiration noted  Abdomen: Soft, gravid, appropriate for gestational age. Pain/Pressure: Absent     Pelvic:  Cervical exam deferred        Extremities: Normal range of motion.     Mental Status: Normal mood and affect. Normal behavior. Normal judgment and thought content.   Assessment   [name] by  09/09/2021,  Date entered prior to episode creation presenting for routine prenatal visit  Plan   pregnancy 2  Problems (from 01/23/21 to present)    Problem Noted Resolved   Encounter for supervision of other normal pregnancy, first trimester 01/23/2021 by Zipporah Plants, CNM No   Overview Addendum 02/14/2021 11:31 AM by Zipporah Plants, CNM     Nursing Staff Provider  Office Location  [facility] Dating   LMP = 10 wk [place]  Language  English Anatomy US    Flu Vaccine   declined Genetic Screen  NIPS:   desires  TDaP vaccine    Hgb A1C or  GTT Early : 65 Third trimester :   Rhogam   n/a   LAB RESULTS   Feeding Plan  breast Blood Type --/--/O POS (03/25 0855)   Contraception  Antibody NEG (03/25 0855)  Circumcision  Rubella 1.83 (03/26 2027)  Pediatrician    RPR NON REACTIVE (03/26 2027)   Support Person  [name] HBsAg NON REACTIVE (03/25 1046)   Prenatal Classes   HIV NON REACTIVE (03/26 0532)    Varicella  immune  BTL Consent  n/a GBS  (For PCN allergy, check sensitivities)        VBAC Consent  desires Pap  n/a <21 yo    Hgb Electro   normal adult hgb    CF  negative   COVID-19  declined vax  had in 9/21 SMA  negative         History of preeclampsia History of cesarean, desires TOLAC       Previous Version       Preterm labor symptoms and general obstetric precautions including but not limited to vaginal bleeding, contractions, leaking of fluid and fetal movement were reviewed in detail with the patient.   Return in about 4 weeks (around  04/22/2021) for anatomy scan and rob.  Tresea Mall, CNM 03/25/2021 10:45 AM

## 2021-05-05 ENCOUNTER — Other Ambulatory Visit: Payer: Self-pay

## 2021-05-05 ENCOUNTER — Ambulatory Visit
Admission: RE | Admit: 2021-05-05 | Discharge: 2021-05-05 | Disposition: A | Discharge status: Home / Self Care | Payer: Medicaid Other | Source: Ambulatory Visit | Attending: Obstetrics | Admitting: Obstetrics

## 2021-05-05 DIAGNOSIS — Z3481 Encounter for supervision of other normal pregnancy, first trimester: Secondary | ICD-10-CM | POA: Insufficient documentation

## 2021-05-06 ENCOUNTER — Encounter: Payer: Self-pay | Admitting: Obstetrics and Gynecology

## 2021-05-06 ENCOUNTER — Ambulatory Visit (INDEPENDENT_AMBULATORY_CARE_PROVIDER_SITE_OTHER): Payer: Medicaid Other | Admitting: Obstetrics and Gynecology

## 2021-05-06 VITALS — BP 120/70 | Ht 67.0 in | Wt 243.6 lb

## 2021-05-06 DIAGNOSIS — Z3482 Encounter for supervision of other normal pregnancy, second trimester: Secondary | ICD-10-CM

## 2021-05-06 DIAGNOSIS — O09891 Supervision of other high risk pregnancies, first trimester: Secondary | ICD-10-CM

## 2021-05-06 DIAGNOSIS — Z3A22 22 weeks gestation of pregnancy: Secondary | ICD-10-CM

## 2021-05-06 LAB — POCT URINALYSIS DIPSTICK OB
Glucose, UA: NEGATIVE
POC,PROTEIN,UA: NEGATIVE

## 2021-05-06 NOTE — Patient Instructions (Signed)

## 2021-05-06 NOTE — Progress Notes (Signed)
Routine Prenatal Care Visit  Subjective  Joanne Pacheco is a 20 y.o. G2P1000 at [redacted]w[redacted]d being seen today for ongoing prenatal care.  She is currently monitored for the following issues for this high-risk pregnancy and has Abnormal LFTs; Obesity; Nausea/vomiting in pregnancy; History of cesarean section; Encounter for supervision of normal pregnancy in second trimester; Short interval between pregnancies affecting pregnancy in first trimester, antepartum; and Maternal obesity affecting pregnancy, antepartum on their problem list.  ----------------------------------------------------------------------------------- Patient reports no complaints.   Contractions: Not present. Vag. Bleeding: None.  Movement: Present. Denies leaking of fluid.  ----------------------------------------------------------------------------------- The following portions of the patient's history were reviewed and updated as appropriate: allergies, current medications, past family history, past medical history, past social history, past surgical history and problem list. Problem list updated.   Objective  Blood pressure 120/70, height 5\' 7"  (1.702 m), weight 243 lb 9.6 oz (110.5 kg), last menstrual period 12/03/2020. [name] 245 lb (111.1 kg) Total Weight Gain -1 lb 6.4 oz (-0.635 kg) Urinalysis:      Fetal Status: Fetal Heart Rate (bpm): 135   Movement: Present     General:  Alert, oriented and cooperative. Patient is in no acute distress.  Skin: Skin is warm and dry. No rash noted.   Cardiovascular: Normal heart rate noted  Respiratory: Normal respiratory effort, no problems with respiration noted  Abdomen: Soft, gravid, appropriate for gestational age. Pain/Pressure: Absent     Pelvic:  Cervical exam deferred        Extremities: Normal range of motion.  Edema: None  Mental Status: Normal mood and affect. Normal behavior. Normal judgment and thought content.     Assessment   [name]  by  09/09/2021, Date entered prior to episode creation presenting for routine prenatal visit  Plan   pregnancy 2  Problems (from 01/23/21 to present)     Problem Noted Resolved   Encounter for supervision of normal pregnancy in second trimester 01/23/2021 by 03/25/2021, CNM No   Overview Addendum 05/06/2021 10:53 AM by 05/08/2021, MD     Nursing Staff Provider  Office Location  [facility] Dating   LMP = 10 wk Natale Milch  Language  English Anatomy US  Normal female  Flu Vaccine   declined Genetic Screen  NIPS: normal xx  TDaP vaccine    Hgb A1C or  GTT Early : 65 Third trimester :   Rhogam   n/a   LAB RESULTS   Feeding Plan  breast Blood Type --/--/O POS (03/25 0855)   Contraception  Antibody NEG (03/25 0855)  Circumcision  Rubella 1.83 (03/26 2027)  Pediatrician    RPR NON REACTIVE (03/26 2027)   Support Person  [name] HBsAg NON REACTIVE (03/25 1046)   Prenatal Classes   HIV NON REACTIVE (03/26 0532)    Varicella  immune  BTL Consent  n/a GBS  (For PCN allergy, check sensitivities)        VBAC Consent  desires Pap  n/a <21 yo    Hgb Electro   normal adult hgb    CF  negative   COVID-19  declined vax  had in 9/21 SMA  negative        History of preeclampsia History of cesarean, desires TOLAC History of infant loss at 1 month after delivery            Reviewed anatomy result  Gestational age appropriate obstetric precautions including but not limited to vaginal bleeding, contractions,  leaking of fluid and fetal movement were reviewed in detail with the patient.    Return in about 2 weeks (around 05/20/2021) for ROB in person.  Natale Milch MD [facility], [facility] 05/06/2021, 11:02 AM

## 2021-05-06 NOTE — Addendum Note (Signed)
Addended by: Clement Husbands A on: 05/06/2021 11:49 AM   Modules accepted: Orders

## 2021-05-20 ENCOUNTER — Encounter: Payer: Medicaid Other | Admitting: Obstetrics

## 2021-05-20 ENCOUNTER — Telehealth: Payer: Self-pay | Admitting: Advanced Practice Midwife

## 2021-05-20 NOTE — Telephone Encounter (Signed)
The pt missed her appt on Tuesday, 7/5.  I called to reschedule the appt, but the voice mailbox was full.  I sent a MyChart message to the pt.

## 2021-05-21 ENCOUNTER — Other Ambulatory Visit: Payer: Self-pay

## 2021-05-21 ENCOUNTER — Encounter: Payer: Self-pay | Admitting: Advanced Practice Midwife

## 2021-05-21 ENCOUNTER — Ambulatory Visit (INDEPENDENT_AMBULATORY_CARE_PROVIDER_SITE_OTHER): Payer: Medicaid Other | Admitting: Advanced Practice Midwife

## 2021-05-21 VITALS — Wt 246.0 lb

## 2021-05-21 DIAGNOSIS — Z369 Encounter for antenatal screening, unspecified: Secondary | ICD-10-CM

## 2021-05-21 DIAGNOSIS — Z3482 Encounter for supervision of other normal pregnancy, second trimester: Secondary | ICD-10-CM

## 2021-05-21 DIAGNOSIS — Z131 Encounter for screening for diabetes mellitus: Secondary | ICD-10-CM

## 2021-05-21 DIAGNOSIS — Z130 Encounter for screening for diseases of the blood and blood-forming organs and certain disorders involving the immune mechanism: Secondary | ICD-10-CM

## 2021-05-21 DIAGNOSIS — Z113 Encounter for screening for infections with a predominantly sexual mode of transmission: Secondary | ICD-10-CM

## 2021-05-21 NOTE — Progress Notes (Signed)
Routine Prenatal Care Visit- Virtual Visit  Subjective   Virtual Visit via Telephone Note  I connected with Joanne Pacheco on 05/21/21 at  3:35 PM EDT by telephone and verified that I am speaking with the correct person using two identifiers.   I discussed the limitations, risks, security and privacy concerns of performing an evaluation and management service by telephone and the availability of in person appointments. I also discussed with the patient that there may be a patient responsible charge related to this service. The patient expressed understanding and agreed to proceed.  The patient was at home I spoke with the patient from my office phone The names of people involved in this encounter were: [name] and myself Tresea Mall, CNM   Joanne Pacheco is a 20 y.o. G2P1000 at [redacted]w[redacted]d being seen today for ongoing prenatal care.  She is currently monitored for the following issues for this low-risk pregnancy and has Abnormal LFTs; Obesity; Nausea/vomiting in pregnancy; History of cesarean section; Encounter for supervision of normal pregnancy in second trimester; Short interval between pregnancies affecting pregnancy in first trimester, antepartum; and Maternal obesity affecting pregnancy, antepartum on their problem list.  ----------------------------------------------------------------------------------- Patient reports pink spot with wiping 1 time per day for the past week.   Contractions: Not present. Vag. Bleeding: Scant.  Movement: Present. Denies leaking of fluid.  ----------------------------------------------------------------------------------- The following portions of the patient's history were reviewed and updated as appropriate: allergies, current medications, past family history, past medical history, past social history, past surgical history and problem list. Problem list updated.   Objective  Weight 246 lb (111.6 kg), last menstrual period  12/03/2020. [name] 245 lb (111.1 kg) Total Weight Gain 1 lb (0.454 kg) Urinalysis:      Fetal Status:     Movement: Present     Physical Exam could not be performed. Because of the COVID-19 outbreak this visit was performed over the phone and not in person.   Assessment   [name] by  09/09/2021, Date entered prior to episode creation presenting for routine prenatal visit  Plan   pregnancy 2  Problems (from 01/23/21 to present)    Problem Noted Resolved   Encounter for supervision of normal pregnancy in second trimester 01/23/2021 by Zipporah Plants, CNM No   Overview Addendum 05/06/2021 10:53 AM by Natale Milch, MD     Nursing Staff Provider  Office Location  [facility] Dating   LMP = 10 wk [place]  Language  English Anatomy US  Normal female  Flu Vaccine   declined Genetic Screen  NIPS: normal xx  TDaP vaccine    Hgb A1C or  GTT Early : 65 Third trimester :   Rhogam   n/a   LAB RESULTS   Feeding Plan  breast Blood Type --/--/O POS (03/25 0855)   Contraception  Antibody NEG (03/25 0855)  Circumcision  Rubella 1.83 (03/26 2027)  Pediatrician    RPR NON REACTIVE (03/26 2027)   Support Person  [name] HBsAg NON REACTIVE (03/25 1046)   Prenatal Classes   HIV NON REACTIVE (03/26 0532)    Varicella  immune  BTL Consent  n/a GBS  (For PCN allergy, check sensitivities)        VBAC Consent  desires Pap  n/a <21 yo    Hgb Electro   normal adult hgb    CF  negative   COVID-19  declined vax  had in 9/21 SMA  negative  History of preeclampsia History of cesarean, desires TOLAC History of infant loss at 1 month after delivery            Gestational age appropriate obstetric precautions including but not limited to vaginal bleeding, contractions, leaking of fluid and fetal movement were reviewed in detail with the patient.     Follow Up Instructions:   I discussed the assessment and treatment plan with the patient. The patient was  provided an opportunity to ask questions and all were answered. The patient agreed with the plan and demonstrated an understanding of the instructions.   The patient was advised to call back or seek an in-person evaluation if the symptoms worsen or if the condition fails to improve as anticipated.  I provided 10 minutes of non-face-to-face time during this encounter.  Return in about 4 weeks (around 06/18/2021) for 28 wk labs and rob.   Parke Poisson, CNM [facility] 05/21/21, 3:43 PM

## 2021-06-18 ENCOUNTER — Other Ambulatory Visit: Payer: Self-pay

## 2021-06-18 ENCOUNTER — Other Ambulatory Visit: Payer: Medicaid Other

## 2021-06-18 ENCOUNTER — Ambulatory Visit (INDEPENDENT_AMBULATORY_CARE_PROVIDER_SITE_OTHER): Payer: Medicaid Other | Admitting: Obstetrics & Gynecology

## 2021-06-18 ENCOUNTER — Encounter: Payer: Self-pay | Admitting: Obstetrics & Gynecology

## 2021-06-18 VITALS — BP 120/80 | Wt 241.0 lb

## 2021-06-18 DIAGNOSIS — Z130 Encounter for screening for diseases of the blood and blood-forming organs and certain disorders involving the immune mechanism: Secondary | ICD-10-CM

## 2021-06-18 DIAGNOSIS — Z131 Encounter for screening for diabetes mellitus: Secondary | ICD-10-CM

## 2021-06-18 DIAGNOSIS — Z3482 Encounter for supervision of other normal pregnancy, second trimester: Secondary | ICD-10-CM

## 2021-06-18 DIAGNOSIS — Z113 Encounter for screening for infections with a predominantly sexual mode of transmission: Secondary | ICD-10-CM

## 2021-06-18 DIAGNOSIS — Z369 Encounter for antenatal screening, unspecified: Secondary | ICD-10-CM

## 2021-06-18 DIAGNOSIS — Z3483 Encounter for supervision of other normal pregnancy, third trimester: Secondary | ICD-10-CM

## 2021-06-18 DIAGNOSIS — Z98891 History of uterine scar from previous surgery: Secondary | ICD-10-CM

## 2021-06-18 DIAGNOSIS — Z3A28 28 weeks gestation of pregnancy: Secondary | ICD-10-CM

## 2021-06-18 NOTE — Progress Notes (Signed)
  Subjective  Fetal Movement? yes Contractions? no Leaking Fluid? no Vaginal Bleeding? no  Objective  BP 120/80   Wt 241 lb (109.3 kg)   LMP 12/03/2020 (Exact Date)   BMI 37.75 kg/m  General: NAD Pumonary: no increased work of breathing Abdomen: gravid, non-tender Extremities: no edema Psychiatric: mood appropriate, affect full  Assessment  20 y.o. G2P1000 at [redacted]w[redacted]d by  09/09/2021, Date entered prior to episode creation presenting for routine prenatal visit  Plan   Problem List Items Addressed This Visit      Other   History of cesarean section  Other Visit Diagnoses    Encounter for supervision of other normal pregnancy in third trimester    -  Primary   [redacted] weeks gestation of pregnancy        PNV, [facility] Glucola Plans VBAC    DIscussed pros and cons of CS vs VBAC  pregnancy 2  Problems (from 01/23/21 to present)    Problem     Encounter for supervision of normal pregnancy in second trimester     Overview Addendum 05/06/2021 10:53 AM by Natale Milch, MD     Nursing Staff Provider  Office Location  [facility] Dating   LMP = 10 wk [place]  Language  English Anatomy US  Normal female  Flu Vaccine   declined Genetic Screen  NIPS: normal xx  TDaP vaccine    Hgb A1C or  GTT Early : 64 Third trimester :   Rhogam   n/a   LAB RESULTS   Feeding Plan  breast Blood Type --/--/O POS (03/25 0855)   Contraception  Antibody NEG (03/25 0855)  Circumcision  Rubella 1.83 (03/26 2027)  Pediatrician    RPR NON REACTIVE (03/26 2027)   Support Person  [name] HBsAg NON REACTIVE (03/25 1046)   Prenatal Classes   HIV NON REACTIVE (03/26 0532)    Varicella  immune  BTL Consent  n/a GBS  (For PCN allergy, check sensitivities)        VBAC Consent  desires Pap  n/a <21 yo    Hgb Electro   normal adult hgb    CF  negative   COVID-19  declined vax  had in 9/21 SMA  negative       History of preeclampsia History of cesarean, desires TOLAC History of infant loss at 1 month after  delivery       Annamarie Major, MD, [facility], [facility] 06/18/2021  3:02 PM

## 2021-06-18 NOTE — Patient Instructions (Signed)
Thank you for choosing [facility]. As part of our ongoing efforts to improve patient experience, we would appreciate your feedback. Please fill out the short survey that you will receive by mail or MyChart. Your opinion is important to us! -Dr Devan Danzer  Third Trimester of Pregnancy  The third trimester of pregnancy is from week 28 through week 40. This is months 7 through 9. The third trimester is a time when the unborn baby (fetus) is growing rapidly. At the end of the ninth month, the fetus is about 20inches long and weighs 6-10 pounds. Body changes during your third trimester During the third trimester, your body will continue to go through many changes.The changes vary and generally return to normal after your baby is born. Physical changes Your weight will continue to increase. You can expect to gain 25-35 pounds (11-16 kg) by the end of the pregnancy if you begin pregnancy at a normal weight. If you are underweight, you can expect to gain 28-40 lb (about 13-18 kg), and if you are overweight, you can expect to gain 15-25 lb (about 7-11 kg). You may begin to get stretch marks on your hips, abdomen, and breasts. Your breasts will continue to grow and may hurt. A yellow fluid (colostrum) may leak from your breasts. This is the first milk you are producing for your baby. You may have changes in your hair. These can include thickening of your hair, rapid growth, and changes in texture. Some people also have hair loss during or after pregnancy, or hair that feels dry or thin. Your belly button may stick out. You may notice more swelling in your hands, face, or ankles. Health changes You may have heartburn. You may have constipation. You may develop hemorrhoids. You may develop swollen, bulging veins (varicose veins) in your legs. You may have increased body aches in the pelvis, back, or thighs. This is due to weight gain and increased hormones that are relaxing your joints. You may have  increased tingling or numbness in your hands, arms, and legs. The skin on your abdomen may also feel numb. You may feel short of breath because of your expanding uterus. Other changes You may urinate more often because the fetus is moving lower into your pelvis and pressing on your bladder. You may have more problems sleeping. This may be caused by the size of your abdomen, an increased need to urinate, and an increase in your body's metabolism. You may notice the fetus "dropping," or moving lower in your abdomen (lightening). You may have increased vaginal discharge. You may notice that you have pain around your pelvic bone as your uterus distends. Follow these instructions at home: Medicines Follow your health care provider's instructions regarding medicine use. Specific medicines may be either safe or unsafe to take during pregnancy. Do not take any medicines unless approved by your health care provider. Take a prenatal vitamin that contains at least 600 micrograms (mcg) of folic acid. Eating and drinking Eat a healthy diet that includes fresh fruits and vegetables, whole grains, good sources of protein such as meat, eggs, or tofu, and low-fat dairy products. Avoid raw meat and unpasteurized juice, milk, and cheese. These carry germs that can harm you and your baby. Eat 4 or 5 small meals rather than 3 large meals a day. You may need to take these actions to prevent or treat constipation: Drink enough fluid to keep your urine pale yellow. Eat foods that are high in fiber, such as beans, whole grains,   and fresh fruits and vegetables. Limit foods that are high in fat and processed sugars, such as fried or sweet foods. Activity Exercise only as directed by your health care provider. Most people can continue their usual exercise routine during pregnancy. Try to exercise for 30 minutes at least 5 days a week. Stop exercising if you experience contractions in the uterus. Stop exercising if you  develop pain or cramping in the lower abdomen or lower back. Avoid heavy lifting. Do not exercise if it is very hot or humid or if you are at a high altitude. If you choose to, you may continue to have sex unless your health care provider tells you not to. Relieving pain and discomfort Take frequent breaks and rest with your legs raised (elevated) if you have leg cramps or low back pain. Take warm sitz baths to soothe any pain or discomfort caused by hemorrhoids. Use hemorrhoid cream if your health care provider approves. Wear a supportive bra to prevent discomfort from breast tenderness. If you develop varicose veins: Wear support hose as told by your health care provider. Elevate your feet for 15 minutes, 3-4 times a day. Limit salt in your diet. Safety Talk to your health care provider before traveling far distances. Do not use hot tubs, steam rooms, or saunas. Wear your seat belt at all times when driving or riding in a car. Talk with your health care provider if someone is verbally or physically abusive to you. Preparing for birth To prepare for the arrival of your baby: Take prenatal classes to understand, practice, and ask questions about labor and delivery. Visit the hospital and tour the maternity area. Purchase a rear-facing car seat and make sure you know how to install it in your car. Prepare the baby's room or sleeping area. Make sure to remove all pillows and stuffed animals from the baby's crib to prevent suffocation. General instructions Avoid cat litter boxes and soil used by cats. These carry germs that can cause birth defects in the baby. If you have a cat, ask someone to clean the litter box for you. Do not douche or use tampons. Do not use scented sanitary pads. Do not use any products that contain nicotine or tobacco, such as cigarettes, e-cigarettes, and chewing tobacco. If you need help quitting, ask your health care provider. Do not use any herbal remedies, illegal  drugs, or medicines that were not prescribed to you. Chemicals in these products can harm your baby. Do not drink alcohol. You will have more frequent prenatal exams during the third trimester. During a routine prenatal visit, your health care provider will do a physical exam, perform tests, and discuss your overall health. Keep all follow-up visits. This is important. Where to find more information American Pregnancy Association: americanpregnancy.org American College of Obstetricians and Gynecologists: acog.org/en/Womens%20Health/Pregnancy Office on Women's Health: womenshealth.gov/pregnancy Contact a health care provider if you have: A fever. Mild pelvic cramps, pelvic pressure, or nagging pain in your abdominal area or lower back. Vomiting or diarrhea. Bad-smelling vaginal discharge or foul-smelling urine. Pain when you urinate. A headache that does not go away when you take medicine. Visual changes or see spots in front of your eyes. Get help right away if: Your water breaks. You have regular contractions less than 5 minutes apart. You have spotting or bleeding from your vagina. You have severe abdominal pain. You have difficulty breathing. You have chest pain. You have fainting spells. You have not felt your baby move for the time period   told by your health care provider. You have new or increased pain, swelling, or redness in an arm or leg. Summary The third trimester of pregnancy is from week 28 through week 40 (months 7 through 9). You may have more problems sleeping. This can be caused by the size of your abdomen, an increased need to urinate, and an increase in your body's metabolism. You will have more frequent prenatal exams during the third trimester. Keep all follow-up visits. This is important. This information is not intended to replace advice given to you by your health care provider. Make sure you discuss any questions you have with your healthcare provider. Document  Revised: 04/10/2020 Document Reviewed: 02/15/2020 Elsevier Patient Education  2022 Elsevier Inc.  

## 2021-06-19 LAB — 28 WEEK RH+PANEL
Basophils Absolute: 0 10*3/uL (ref 0.0–0.2)
Basos: 0 %
EOS (ABSOLUTE): 0.1 10*3/uL (ref 0.0–0.4)
Eos: 1 %
Gestational Diabetes Screen: 80 mg/dL (ref 65–139)
HIV Screen 4th Generation wRfx: NONREACTIVE
Hematocrit: 30.6 % — ABNORMAL LOW (ref 34.0–46.6)
Hemoglobin: 10.7 g/dL — ABNORMAL LOW (ref 11.1–15.9)
Immature Grans (Abs): 0 10*3/uL (ref 0.0–0.1)
Immature Granulocytes: 0 %
Lymphocytes Absolute: 1.6 10*3/uL (ref 0.7–3.1)
Lymphs: 25 %
MCH: 31.2 pg (ref 26.6–33.0)
MCHC: 35 g/dL (ref 31.5–35.7)
MCV: 89 fL (ref 79–97)
Monocytes Absolute: 0.3 10*3/uL (ref 0.1–0.9)
Monocytes: 5 %
Neutrophils Absolute: 4.5 10*3/uL (ref 1.4–7.0)
Neutrophils: 69 %
Platelets: 318 10*3/uL (ref 150–450)
RBC: 3.43 x10E6/uL — ABNORMAL LOW (ref 3.77–5.28)
RDW: 13 % (ref 11.7–15.4)
RPR Ser Ql: NONREACTIVE
WBC: 6.5 10*3/uL (ref 3.4–10.8)

## 2021-07-04 ENCOUNTER — Other Ambulatory Visit: Payer: Self-pay

## 2021-07-04 ENCOUNTER — Encounter: Payer: Self-pay | Admitting: Obstetrics & Gynecology

## 2021-07-04 ENCOUNTER — Ambulatory Visit (INDEPENDENT_AMBULATORY_CARE_PROVIDER_SITE_OTHER): Payer: Medicaid Other | Admitting: Obstetrics & Gynecology

## 2021-07-04 VITALS — BP 120/80 | Wt 243.0 lb

## 2021-07-04 DIAGNOSIS — Z3482 Encounter for supervision of other normal pregnancy, second trimester: Secondary | ICD-10-CM

## 2021-07-04 DIAGNOSIS — Z3483 Encounter for supervision of other normal pregnancy, third trimester: Secondary | ICD-10-CM

## 2021-07-04 DIAGNOSIS — Z98891 History of uterine scar from previous surgery: Secondary | ICD-10-CM

## 2021-07-04 DIAGNOSIS — Z3A30 30 weeks gestation of pregnancy: Secondary | ICD-10-CM

## 2021-07-04 NOTE — Progress Notes (Signed)
  Subjective  Fetal Movement? yes Contractions? no Leaking Fluid? no Vaginal Bleeding? no  Objective  BP 120/80   Wt 243 lb (110.2 kg)   LMP 12/03/2020 (Exact Date)   BMI 38.06 kg/m  General: NAD Pumonary: no increased work of breathing Abdomen: gravid, non-tender Extremities: no edema Psychiatric: mood appropriate, affect full  Assessment  20 y.o. G2P1000 at [redacted]w[redacted]d by  09/09/2021, Date entered prior to episode creation presenting for routine prenatal visit  Plan   Problem List Items Addressed This Visit      Other   History of cesarean section  Other Visit Diagnoses    Encounter for supervision of other normal pregnancy in third trimester    -  Primary   [redacted] weeks gestation of pregnancy        Desires to breast feed and Nexplanon for birth control PNV  pregnancy 2  Problems (from 01/23/21 to present)    Problem Noted Resolved   Encounter for supervision of normal pregnancy in second trimester 01/23/2021 by Zipporah Plants, CNM No   Overview Addendum 05/06/2021 10:53 AM by Natale Milch, MD     Nursing Staff Provider  Office Location  [facility] Dating   LMP = 10 wk [place]  Language  English Anatomy US  Normal female  Flu Vaccine   declined Genetic Screen  NIPS: normal xx  TDaP vaccine    Hgb A1C or  GTT Early : 64 Third trimester :   Rhogam   n/a   LAB RESULTS   Feeding Plan  breast Blood Type --/--/O POS (03/25 0855)   Contraception  Antibody NEG (03/25 0855)  Circumcision  Rubella 1.83 (03/26 2027)  Pediatrician    RPR NON REACTIVE (03/26 2027)   Support Person  [name] HBsAg NON REACTIVE (03/25 1046)   Prenatal Classes   HIV NON REACTIVE (03/26 0532)    Varicella  immune  BTL Consent  n/a GBS  (For PCN allergy, check sensitivities)        VBAC Consent  desires Pap  n/a <21 yo    Hgb Electro   normal adult hgb    CF  negative   COVID-19  declined vax  had in 9/21 SMA  negative         History of preeclampsia History of cesarean, desires  TOLAC History of infant loss at 1 month after delivery           Joanne Major, MD, [facility], [facility] 07/04/2021  2:26 PM

## 2021-07-04 NOTE — Patient Instructions (Signed)
Tdap (Tetanus, Diphtheria, Pertussis) Vaccine: What You Need to Know 1. Why get vaccinated? Tdap vaccine can prevent tetanus, diphtheria, and pertussis. Diphtheria and pertussis spread from person to person. Tetanus enters the body through cuts or wounds. TETANUS (T) causes painful stiffening of the muscles. Tetanus can lead to serious health problems, including being unable to open the mouth, having trouble swallowing and breathing, or death. DIPHTHERIA (D) can lead to difficulty breathing, heart failure, paralysis, or death. PERTUSSIS (aP), also known as "whooping cough," can cause uncontrollable, violent coughing that makes it hard to breathe, eat, or drink. Pertussis can be extremely serious especially in babies and young children, causing pneumonia, convulsions, brain damage, or death. In teens and adults, it can cause weight loss, loss of bladder control, passing out, and rib fractures from severe coughing. 2. Tdap vaccine Tdap is only for children 7 years and older, adolescents, and adults.  Adolescents should receive a single dose of Tdap, preferably at age 11 or 12 years. Pregnant people should get a dose of Tdap during every pregnancy, preferably during the early part of the third trimester, to help protect the newborn from pertussis. Infants are most at risk for severe, life-threatening complications frompertussis. Adults who have never received Tdap should get a dose of Tdap. Also, adults should receive a booster dose of either Tdap or Td (a different vaccine that protects against tetanus and diphtheria but not pertussis) every 10 years, or after 5 years in the case of a severe or dirty wound or burn. Tdap may be given at the same time as other vaccines. 3. Talk with your health care provider Tell your vaccine provider if the person getting the vaccine: Has had an allergic reaction after a previous dose of any vaccine that protects against tetanus, diphtheria, or pertussis, or has any  severe, life-threatening allergies Has had a coma, decreased level of consciousness, or prolonged seizures within 7 days after a previous dose of any pertussis vaccine (DTP, DTaP, or Tdap) Has seizures or another nervous system problem Has ever had Guillain-Barr Syndrome (also called "GBS") Has had severe pain or swelling after a previous dose of any vaccine that protects against tetanus or diphtheria In some cases, your health care provider may decide to postpone Tdapvaccination until a future visit. People with minor illnesses, such as a cold, may be vaccinated. People who are moderately or severely ill should usually wait until they recover beforegetting Tdap vaccine.  Your health care provider can give you more information. 4. Risks of a vaccine reaction Pain, redness, or swelling where the shot was given, mild fever, headache, feeling tired, and nausea, vomiting, diarrhea, or stomachache sometimes happen after Tdap vaccination. People sometimes faint after medical procedures, including vaccination. Tellyour provider if you feel dizzy or have vision changes or ringing in the ears.  As with any medicine, there is a very remote chance of a vaccine causing asevere allergic reaction, other serious injury, or death. 5. What if there is a serious problem? An allergic reaction could occur after the vaccinated person leaves the clinic. If you see signs of a severe allergic reaction (hives, swelling of the face and throat, difficulty breathing, a fast heartbeat, dizziness, or weakness), call [id number] get the person to the nearest hospital. For other signs that concern you, call your health care provider.  Adverse reactions should be reported to the Vaccine Adverse Event Reporting System (VAERS). Your health care provider will usually file this report, or you can do it yourself. Visit the   VAERS website at www.vaers.hhs.gov or call 1-800-822-7967. VAERS is only for reporting reactions, and VAERS staff  members do not give medical advice. 6. The National Vaccine Injury Compensation Program The National Vaccine Injury Compensation Program (VICP) is a federal program that was created to compensate people who may have been injured by certain vaccines. Claims regarding alleged injury or death due to vaccination have a time limit for filing, which may be as short as two years. Visit the VICP website at www.hrsa.gov/vaccinecompensation or call 1-800-338-2382to learn about the program and about filing a claim. 7. How can I learn more? Ask your health care provider. Call your local or state health department. Visit the website of the Food and Drug Administration (FDA) for vaccine package inserts and additional information at www.fda.gov/vaccines-blood-biologics/vaccines. Contact the Centers for Disease Control and Prevention (CDC): Call 1-800-232-4636 (1-800-CDC-INFO) or Visit CDC's website at www.cdc.gov/vaccines. Vaccine Information Statement Tdap (Tetanus, Diphtheria, Pertussis) Vaccine(06/21/2020) This information is not intended to replace advice given to you by your health care provider. Make sure you discuss any questions you have with your healthcare provider. Document Revised: 07/17/2020 Document Reviewed: 07/17/2020 Elsevier Patient Education  2022 Elsevier Inc.  

## 2021-07-16 ENCOUNTER — Encounter: Payer: Medicaid Other | Admitting: Obstetrics & Gynecology

## 2021-07-19 ENCOUNTER — Observation Stay
Admission: EM | Admit: 2021-07-19 | Discharge: 2021-07-19 | Disposition: A | Discharge status: Home / Self Care | Payer: Medicaid Other | Attending: Obstetrics and Gynecology | Admitting: Obstetrics and Gynecology

## 2021-07-19 ENCOUNTER — Encounter: Payer: Self-pay | Admitting: Advanced Practice Midwife

## 2021-07-19 ENCOUNTER — Other Ambulatory Visit: Payer: Self-pay

## 2021-07-19 DIAGNOSIS — R102 Pelvic and perineal pain: Secondary | ICD-10-CM

## 2021-07-19 DIAGNOSIS — O26893 Other specified pregnancy related conditions, third trimester: Secondary | ICD-10-CM

## 2021-07-19 DIAGNOSIS — O26899 Other specified pregnancy related conditions, unspecified trimester: Secondary | ICD-10-CM

## 2021-07-19 DIAGNOSIS — Z3A32 32 weeks gestation of pregnancy: Secondary | ICD-10-CM

## 2021-07-19 DIAGNOSIS — O0001 Abdominal pregnancy with intrauterine pregnancy: Principal | ICD-10-CM | POA: Insufficient documentation

## 2021-07-19 DIAGNOSIS — Z3480 Encounter for supervision of other normal pregnancy, unspecified trimester: Secondary | ICD-10-CM

## 2021-07-19 DIAGNOSIS — Z87891 Personal history of nicotine dependence: Secondary | ICD-10-CM | POA: Diagnosis not present

## 2021-07-19 DIAGNOSIS — R109 Unspecified abdominal pain: Secondary | ICD-10-CM

## 2021-07-19 DIAGNOSIS — Z3482 Encounter for supervision of other normal pregnancy, second trimester: Secondary | ICD-10-CM

## 2021-07-19 HISTORY — DX: Other specified health status: Z78.9

## 2021-07-19 LAB — URINALYSIS, COMPLETE (UACMP) WITH MICROSCOPIC
Bilirubin Urine: NEGATIVE
Glucose, UA: NEGATIVE mg/dL
Hgb urine dipstick: NEGATIVE
Ketones, ur: 15 mg/dL — AB
Nitrite: NEGATIVE
Specific Gravity, Urine: 1.025 (ref 1.005–1.030)
pH: 6.5 (ref 5.0–8.0)

## 2021-07-19 LAB — PROTEIN / CREATININE RATIO, URINE
Creatinine, Urine: 212 mg/dL
Protein Creatinine Ratio: 0.09 mg/mg{Cre} (ref 0.00–0.15)
Total Protein, Urine: 19 mg/dL

## 2021-07-19 NOTE — OB Triage Note (Signed)
Pt discharged home per order.  Pt stable and ambulatory and an After Visit Summary was printed and given to the patient. Discharge education completed with patient including follow up instructions, appointments, and medication list. Pt received labor and bleeding precautions. Patient able to verbalize understanding, all questions fully answered upon discharge. Patient instructed to return to ED, call 911, or call MD for any changes in condition. Pt discharged home via personal vehicle with support person.   

## 2021-07-19 NOTE — Discharge Summary (Signed)
Physician Final Progress Note  Patient ID: Joanne Pacheco MRN: [id number] DOB/AGE: 05-Sep-2001 20 y.o.  Admit date: 07/19/2021 Admitting provider: Tresea Pacheco, CNM Discharge date: 07/19/2021   Admission Diagnoses:  1) intrauterine pregnancy at [redacted]w[redacted]d  2) abdominal pain  Discharge Diagnoses:  Principal Problem:   Supervision of other normal pregnancy, antepartum Active Problems:   Abdominal pain during pregnancy in third trimester   [redacted] weeks gestation of pregnancy   Pain of round ligament affecting pregnancy, antepartum    History of Present Illness: The patient is a 20 y.o. female G2P1000 at [redacted]w[redacted]d who presents for abdominal pain that started earlier today. She feels the pain mostly right lateral mid abdomen and occasionally she feels it on left lateral mid abdomen. She has not tried anything to help decrease the pain. She denies abdominal trauma, nausea, vomiting, fever, urinary symptoms, vaginitis symptoms. She denies vaginal bleeding or leakage of fluid. She reports good fetal movement.   Past Medical History:  Diagnosis Date   Hyperemesis 02/08/2020   Medical history non-contributory    Obesity     Past Surgical History:  Procedure Laterality Date   CESAREAN SECTION     07/18/2020   CHOLECYSTECTOMY     2021 (at [redacted] weeks gestation)    No current facility-administered medications on file prior to encounter.   Current Outpatient Medications on File Prior to Encounter  Medication Sig Dispense Refill   Prenatal Vit-Fe Fumarate-FA (PRENATAL MULTIVITAMIN) TABS tablet Take 1 tablet by mouth daily at 12 noon.      No Known Allergies  Social History   Socioeconomic History   Marital status: Single    Spouse name: Not on file   Number of children: Not on file   Years of education: Not on file   Highest education level: Not on file  Occupational History   Not on file  Tobacco Use   Smoking status: Former    Types: Cigarettes    Quit date: 01/14/2021    Years since  quitting: 0.5   Smokeless tobacco: Never  Vaping Use   Vaping Use: Never used  Substance and Sexual Activity   Alcohol use: Not Currently    Comment: last use 01/02/21- one glass wine   Drug use: Never   Sexual activity: Yes    Birth control/protection: None  Other Topics Concern   Not on file  Social History Narrative   Not on file   Social Determinants of Health   Financial Resource Strain: Not on file  Food Insecurity: Not on file  Transportation Needs: Not on file  Physical Activity: Not on file  Stress: Not on file  Social Connections: Not on file  Intimate Partner Violence: Not At Risk   Fear of Current or Ex-Partner: No   Emotionally Abused: No   Physically Abused: No   Sexually Abused: No    History reviewed. No pertinent family history.   Review of Systems  Constitutional:  Negative for chills and fever.  HENT:  Negative for congestion, ear discharge, ear pain, hearing loss, sinus pain and sore throat.   Eyes:  Negative for blurred vision and double vision.  Respiratory:  Negative for cough, shortness of breath and wheezing.   Cardiovascular:  Negative for chest pain, palpitations and leg swelling.  Gastrointestinal:  Positive for abdominal pain. Negative for blood in stool, constipation, diarrhea, heartburn, melena, nausea and vomiting.  Genitourinary:  Negative for dysuria, flank pain, frequency, hematuria and urgency.  Musculoskeletal:  Negative for back pain, joint  pain and myalgias.  Skin:  Negative for itching and rash.  Neurological:  Negative for dizziness, tingling, tremors, sensory change, speech change, focal weakness, seizures, loss of consciousness, weakness and headaches.  Endo/Heme/Allergies:  Negative for environmental allergies. Does not bruise/bleed easily.  Psychiatric/Behavioral:  Negative for depression, hallucinations, memory loss, substance abuse and suicidal ideas. The patient is not nervous/anxious and does not have insomnia.     Physical  Exam: BP 120/68 (BP Location: Right Arm)   Pulse 99   Temp 98.4 F (36.9 C) (Oral)   Resp 18   Ht 5\' 7"  (1.702 m)   LMP 12/03/2020 (Exact Date)   BMI 38.06 kg/m   Constitutional: Well nourished, well developed female in no acute distress.  HEENT: normal Skin: Warm and dry.  Cardiovascular: Regular rate and rhythm.   Extremity:  no edema   Respiratory: Clear to auscultation bilateral. Normal respiratory effort Abdomen: FHT present Back: no CVAT Neuro: DTRs 2+, Cranial nerves grossly intact Psych: Alert and Oriented x3. No memory deficits. Normal mood and affect.  MS: normal gait, normal bilateral lower extremity ROM/strength/stability.  Pelvic exam: (female chaperone present) is not limited by body habitus EGBUS: within normal limits Vagina: within normal limits and with normal mucosa  Cervix: closed/thick/high  Toco: negative Fetal Well Being: 135 bpm, moderate variability, +accelerations, -decelerations   Consults: None  Significant Findings/ Diagnostic Studies: labs:  Results for Joanne, Pacheco (MRN [name]) as of 07/19/2021 18:49  Ref. Range 07/19/2021 17:17  Appearance Latest Ref Range: CLEAR  CLEAR  Bilirubin Urine Latest Ref Range: NEGATIVE  NEGATIVE  Color, Urine Latest Ref Range: YELLOW  YELLOW  Glucose, UA Latest Ref Range: NEGATIVE mg/dL NEGATIVE  Hgb urine dipstick Latest Ref Range: NEGATIVE  NEGATIVE  Ketones, ur Latest Ref Range: NEGATIVE mg/dL 15 (A)  Leukocytes,Ua Latest Ref Range: NEGATIVE  SMALL (A)  Nitrite Latest Ref Range: NEGATIVE  NEGATIVE  pH Latest Ref Range: 5.0 - 8.0  6.5  Protein Latest Ref Range: NEGATIVE mg/dL TRACE (A)  Specific Gravity, Urine Latest Ref Range: 1.005 - 1.030  1.025  Bacteria, UA Latest Ref Range: NONE SEEN  FEW (A)  Mucus Unknown PRESENT  RBC / HPF Latest Ref Range: 0 - 5 RBC/hpf 0-5  Squamous Epithelial / LPF Latest Ref Range: 0 - 5  0-5  WBC, UA Latest Ref Range: 0 - 5 WBC/hpf 11-20    Procedures: NST  Hospital  Course: The patient was admitted to Labor and Delivery Triage for observation.   Discharge Condition: good  Disposition: Discharge disposition: 01-Home or Self Care  Diet: Regular diet, stay well hydrated  Discharge Activity: Activity as tolerated, rest, heat, tylenol  Discharge Instructions     Discharge activity:  No Restrictions   Complete by: As directed    Heating pad, tylenol, tub soaks   Discharge diet:  No restrictions   Complete by: As directed    Stay well hydrated   No sexual activity restrictions   Complete by: As directed    Notify physician for a general feeling that "something is not right"   Complete by: As directed    Notify physician for increase or change in vaginal discharge   Complete by: As directed    Notify physician for intestinal cramps, with or without diarrhea, sometimes described as "gas pain"   Complete by: As directed    Notify physician for leaking of fluid   Complete by: As directed    Notify physician for low, dull backache, unrelieved  by heat or Tylenol   Complete by: As directed    Notify physician for menstrual like cramps   Complete by: As directed    Notify physician for pelvic pressure   Complete by: As directed    Notify physician for uterine contractions.  These may be painless and feel like the uterus is tightening or the baby is  "balling up"   Complete by: As directed    Notify physician for vaginal bleeding   Complete by: As directed    PRETERM LABOR:  Includes any of the follwing symptoms that occur between 20 - [redacted] weeks gestation.  If these symptoms are not stopped, preterm labor can result in preterm delivery, placing your baby at risk   Complete by: As directed       Allergies as of 07/19/2021   No Known Allergies      Medication List     TAKE these medications    prenatal multivitamin Tabs tablet Take 1 tablet by mouth daily at 12 noon.        Follow-up Information     [facility]. Go to.    Specialty: Obstetrics and Gynecology Why: scheduled prenatal appointment Contact information: [address] [place] [address] [phone]                Total time spent taking care of this patient: 22 minutes  Signed: Tresea Pacheco, CNM  07/19/2021, 6:23 PM

## 2021-07-19 NOTE — OB Triage Note (Signed)
Pt presents to L/D triage with reported intermittent abdominal pain, rated 10/10-talking and resting in bed. Pt describes pain as travelling from anterior left, right, and center pain that is mainly on right side. Abdomen tender to touch. Pain is worse with movement.  Pt also describes intermittent vaginal pressure. Abdomen palpates soft and non-distended. Pt reports no urinary issues at this time. Pt reports drinking minimal fluids today.  Pt reports no bleeding or LOF and states positive fetal movement noted. Monitors applied and assessing- FHT 152.  VSS.

## 2021-07-30 ENCOUNTER — Encounter: Payer: Medicaid Other | Admitting: Obstetrics

## 2021-08-01 ENCOUNTER — Ambulatory Visit (INDEPENDENT_AMBULATORY_CARE_PROVIDER_SITE_OTHER): Payer: Medicaid Other | Admitting: Obstetrics and Gynecology

## 2021-08-01 ENCOUNTER — Encounter: Payer: Self-pay | Admitting: Obstetrics and Gynecology

## 2021-08-01 ENCOUNTER — Other Ambulatory Visit: Payer: Self-pay

## 2021-08-01 VITALS — BP 120/72 | Wt 241.0 lb

## 2021-08-01 DIAGNOSIS — Z3A36 36 weeks gestation of pregnancy: Secondary | ICD-10-CM

## 2021-08-01 DIAGNOSIS — Z3480 Encounter for supervision of other normal pregnancy, unspecified trimester: Secondary | ICD-10-CM

## 2021-08-01 DIAGNOSIS — O99210 Obesity complicating pregnancy, unspecified trimester: Secondary | ICD-10-CM

## 2021-08-01 DIAGNOSIS — Z98891 History of uterine scar from previous surgery: Secondary | ICD-10-CM

## 2021-08-01 DIAGNOSIS — O09899 Supervision of other high risk pregnancies, unspecified trimester: Secondary | ICD-10-CM

## 2021-08-01 LAB — POCT URINALYSIS DIPSTICK OB
Glucose, UA: NEGATIVE
POC,PROTEIN,UA: NEGATIVE

## 2021-08-01 NOTE — Progress Notes (Signed)
ROB - no concerns. RM 5 

## 2021-08-01 NOTE — Progress Notes (Signed)
Routine Prenatal Care Visit  Subjective  Joanne Pacheco is a 20 y.o. G2P1000 at [redacted]w[redacted]d being seen today for ongoing prenatal care.  She is currently monitored for the following issues for this high-risk pregnancy and has Abnormal LFTs; Obesity; Nausea/vomiting in pregnancy; History of cesarean section; Supervision of other normal pregnancy, antepartum; Short interval between pregnancies affecting pregnancy, antepartum; Maternal obesity affecting pregnancy, antepartum; and Pain of round ligament affecting pregnancy, antepartum on their problem list.  ----------------------------------------------------------------------------------- Patient reports no complaints.   Contractions: Not present. Vag. Bleeding: None.  Movement: Present. Denies leaking of fluid.  ----------------------------------------------------------------------------------- The following portions of the patient's history were reviewed and updated as appropriate: allergies, current medications, past family history, past medical history, past social history, past surgical history and problem list. Problem list updated.   Objective  Blood pressure 120/72, weight 241 lb (109.3 kg), last menstrual period 12/03/2020. [name] 245 lb (111.1 kg) Total Weight Gain -4 lb (-1.814 kg) Body mass index is 37.75 kg/m.  Urinalysis:      Fetal Status: Fetal Heart Rate (bpm): 140 Fundal Height: 34 cm Movement: Present     General:  Alert, oriented and cooperative. Patient is in no acute distress.  Skin: Skin is warm and dry. No rash noted.   Cardiovascular: Normal heart rate noted  Respiratory: Normal respiratory effort, no problems with respiration noted  Abdomen: Soft, gravid, appropriate for gestational age. Pain/Pressure: Absent     Pelvic:  Cervical exam deferred        Extremities: Normal range of motion.     ental Status: Normal mood and affect. Normal behavior. Normal judgment and thought content.     Assessment    [name] by  09/09/2021, Date entered prior to episode creation presenting for routine prenatal visit  Plan   pregnancy 2  Problems (from 01/23/21 to present)     Problem Noted Resolved   [redacted] weeks gestation of pregnancy 07/19/2021 by Tresea Mall, CNM No   Pain of round ligament affecting pregnancy, antepartum 07/19/2021 by Tresea Mall, CNM No   Supervision of other normal pregnancy, antepartum 01/23/2021 by Zipporah Plants, CNM No   Overview Addendum 07/04/2021  2:27 PM by [name], MD     Nursing Staff Provider  Office Location  [facility] Dating   LMP = 10 wk [place]  Language  English Anatomy US  Normal female  Flu Vaccine   declined Genetic Screen  NIPS: normal xx  TDaP vaccine    Hgb A1C or  GTT Early : 65 Third trimester :   Rhogam   n/a   LAB RESULTS   Feeding Plan  breast Blood Type --/--/O POS (03/25 0855)   Contraception  Nexplanon Antibody NEG (03/25 0855)  Circumcision  n/a Rubella 1.83 (03/26 2027)  Pediatrician    RPR NON REACTIVE (03/26 2027)   Support Person  [name] HBsAg NON REACTIVE (03/25 1046)   Prenatal Classes   HIV NON REACTIVE (03/26 0532)    Varicella  immune  BTL Consent  n/a GBS  (For PCN allergy, check sensitivities)        VBAC Consent  desires Pap  n/a <21 yo    Hgb Electro   normal adult hgb    CF  negative   COVID-19  declined vax  had in 9/21 SMA  negative       History of preeclampsia History of cesarean, desires TOLAC History of infant loss at 1 month after delivery  Gestational age appropriate obstetric precautions including but not limited to vaginal bleeding, contractions, leaking of fluid and fetal movement were reviewed in detail with the patient.    Return in about 2 weeks (around 08/15/2021) for ROB 2 weeks, then weekly for 3 weeks.  Vena Austria, MD, [facility], [facility] 08/01/2021, 11:54 AM

## 2021-08-06 ENCOUNTER — Observation Stay
Admission: EM | Admit: 2021-08-06 | Discharge: 2021-08-07 | Disposition: A | Discharge status: Home / Self Care | Payer: Medicaid Other | Attending: Advanced Practice Midwife | Admitting: Advanced Practice Midwife

## 2021-08-06 DIAGNOSIS — O209 Hemorrhage in early pregnancy, unspecified: Principal | ICD-10-CM | POA: Insufficient documentation

## 2021-08-06 DIAGNOSIS — Z3480 Encounter for supervision of other normal pregnancy, unspecified trimester: Secondary | ICD-10-CM

## 2021-08-06 DIAGNOSIS — Z3490 Encounter for supervision of normal pregnancy, unspecified, unspecified trimester: Secondary | ICD-10-CM

## 2021-08-06 DIAGNOSIS — Z87891 Personal history of nicotine dependence: Secondary | ICD-10-CM | POA: Insufficient documentation

## 2021-08-06 DIAGNOSIS — Z3A35 35 weeks gestation of pregnancy: Secondary | ICD-10-CM | POA: Insufficient documentation

## 2021-08-06 DIAGNOSIS — O4693 Antepartum hemorrhage, unspecified, third trimester: Secondary | ICD-10-CM

## 2021-08-07 ENCOUNTER — Other Ambulatory Visit: Payer: Self-pay

## 2021-08-07 DIAGNOSIS — Z87891 Personal history of nicotine dependence: Secondary | ICD-10-CM | POA: Diagnosis not present

## 2021-08-07 DIAGNOSIS — Z3A35 35 weeks gestation of pregnancy: Secondary | ICD-10-CM | POA: Diagnosis not present

## 2021-08-07 DIAGNOSIS — O4693 Antepartum hemorrhage, unspecified, third trimester: Secondary | ICD-10-CM | POA: Diagnosis not present

## 2021-08-07 DIAGNOSIS — O209 Hemorrhage in early pregnancy, unspecified: Secondary | ICD-10-CM | POA: Diagnosis not present

## 2021-08-07 LAB — URINALYSIS, COMPLETE (UACMP) WITH MICROSCOPIC
Bacteria, UA: NONE SEEN
Bilirubin Urine: NEGATIVE
Glucose, UA: NEGATIVE mg/dL
Hgb urine dipstick: NEGATIVE
Ketones, ur: 5 mg/dL — AB
Nitrite: NEGATIVE
Protein, ur: NEGATIVE mg/dL
Specific Gravity, Urine: 1.025 (ref 1.005–1.030)
WBC, UA: >50 WBC/hpf — ABNORMAL HIGH (ref 0–5)
pH: 6 (ref 5.0–8.0)

## 2021-08-07 NOTE — OB Triage Note (Signed)
Patient arrived in triage with c/o red vaginal bleeding for the last 2-3 days that she notices when she wipes. Patient denies leaking of fluid or contractions. Reports fetal movement noted. Also reports pressure that comes and goes. EFM applied and assessing. Initial FHTs 130s.

## 2021-08-07 NOTE — Discharge Instructions (Signed)
Drink at least 8 glasses of water each day. Notify office if spotting continues. Keep regularly scheduled appointment. Return to hospital for vaginal bleeding that soaks a pad, leaking of fluid, decreased fetal movement, or painful contractions.

## 2021-08-07 NOTE — Discharge Summary (Signed)
Physician Final Progress Note  Patient ID: Joanne Pacheco MRN: [id number] DOB/AGE: 2001-05-21 20 y.o.  Admit date: 08/06/2021 Admitting provider: Tresea Mall, CNM Discharge date: 08/07/2021   Admission Diagnoses:  1) intrauterine pregnancy at [redacted]w[redacted]d  2) vaginal bleeding  Discharge Diagnoses:  Principal Problem:   Supervision of other normal pregnancy, antepartum Active Problems:   Labor and delivery, indication for care   [redacted] weeks gestation of pregnancy   Vaginal bleeding in pregnancy, third trimester    History of Present Illness: The patient is a 20 y.o. female G2P1000 at [redacted]w[redacted]d who presents for vaginal bleeding that she has seen for the past few days only when she wipes. Last intercourse was a few days ago. She has not worn a pad. She denies leakage of fluid other than normal vaginal discharge. She denies contractions. She does have some pelvic pressure. She reports good fetal movement. She denies any current bleeding.   Past Medical History:  Diagnosis Date   Hyperemesis 02/08/2020   Medical history non-contributory    Obesity     Past Surgical History:  Procedure Laterality Date   CESAREAN SECTION     07/18/2020   CHOLECYSTECTOMY     2021 (at [redacted] weeks gestation)    No current facility-administered medications on file prior to encounter.   Current Outpatient Medications on File Prior to Encounter  Medication Sig Dispense Refill   Prenatal Vit-Fe Fumarate-FA (PRENATAL MULTIVITAMIN) TABS tablet Take 1 tablet by mouth daily at 12 noon.      No Known Allergies  Social History   Socioeconomic History   Marital status: Single    Spouse name: Not on file   Number of children: Not on file   Years of education: Not on file   Highest education level: Not on file  Occupational History   Not on file  Tobacco Use   Smoking status: Former    Types: Cigarettes    Quit date: 01/14/2021    Years since quitting: 0.5   Smokeless tobacco: Never  Vaping Use   Vaping Use:  Never used  Substance and Sexual Activity   Alcohol use: Not Currently    Comment: last use 01/02/21- one glass wine   Drug use: Never   Sexual activity: Yes    Birth control/protection: None  Other Topics Concern   Not on file  Social History Narrative   Not on file   Social Determinants of Health   Financial Resource Strain: Not on file  Food Insecurity: Not on file  Transportation Needs: Not on file  Physical Activity: Not on file  Stress: Not on file  Social Connections: Not on file  Intimate Partner Violence: Not At Risk   Fear of Current or Ex-Partner: No   Emotionally Abused: No   Physically Abused: No   Sexually Abused: No    No family history on file.   Review of Systems  Constitutional:  Negative for chills and fever.  HENT:  Negative for congestion, ear discharge, ear pain, hearing loss, sinus pain and sore throat.   Eyes:  Negative for blurred vision and double vision.  Respiratory:  Negative for cough, shortness of breath and wheezing.   Cardiovascular:  Negative for chest pain, palpitations and leg swelling.  Gastrointestinal:  Negative for abdominal pain, blood in stool, constipation, diarrhea, heartburn, melena, nausea and vomiting.  Genitourinary:  Negative for dysuria, flank pain, frequency, hematuria and urgency.       Positive for vaginal bleeding with wiping and pelvic pressure  Musculoskeletal:  Negative for back pain, joint pain and myalgias.  Skin:  Negative for itching and rash.  Neurological:  Negative for dizziness, tingling, tremors, sensory change, speech change, focal weakness, seizures, loss of consciousness, weakness and headaches.  Endo/Heme/Allergies:  Negative for environmental allergies. Does not bruise/bleed easily.  Psychiatric/Behavioral:  Negative for depression, hallucinations, memory loss, substance abuse and suicidal ideas. The patient is not nervous/anxious and does not have insomnia.     Physical Exam: BP 113/67 (BP Location:  Left Arm)   Pulse 82   Temp 98.5 F (36.9 C) (Oral)   Resp 18   Ht 5\' 7"  (1.702 m)   Wt 109.3 kg   LMP 12/03/2020 (Exact Date)   BMI 37.75 kg/m   Constitutional: Well nourished, well developed female in no acute distress.  HEENT: normal Skin: Warm and dry.  Cardiovascular: Regular rate and rhythm.   Respiratory: Clear to auscultation bilateral. Normal respiratory effort Abdomen: FHT present Psych: Alert and Oriented x3. No memory deficits. Normal mood and affect.   Pelvic exam: cervical exam deferred, no bleeding noted by RN K. Cevallos in triage  Toco: negative for contractions Fetal well being: reactive NST, 130 bpm baseline, moderate variability, +accelerations, -decelerations   Consults: None  Significant Findings/ Diagnostic Studies: labs:   Results for PEONY, BARNER (MRN [name]) as of 08/07/2021 07:00  Ref. Range 08/07/2021 00:55  Appearance Latest Ref Range: CLEAR  HAZY (A)  Bilirubin Urine Latest Ref Range: NEGATIVE  NEGATIVE  Color, Urine Latest Ref Range: YELLOW  YELLOW (A)  Glucose, UA Latest Ref Range: NEGATIVE mg/dL NEGATIVE  Hgb urine dipstick Latest Ref Range: NEGATIVE  NEGATIVE  Ketones, ur Latest Ref Range: NEGATIVE mg/dL 5 (A)  Leukocytes,Ua Latest Ref Range: NEGATIVE  LARGE (A)  Nitrite Latest Ref Range: NEGATIVE  NEGATIVE  pH Latest Ref Range: 5.0 - 8.0  6.0  Protein Latest Ref Range: NEGATIVE mg/dL NEGATIVE  Specific Gravity, Urine Latest Ref Range: 1.005 - 1.030  1.025  Bacteria, UA Latest Ref Range: NONE SEEN  NONE SEEN  Mucus Unknown PRESENT  RBC / HPF Latest Ref Range: 0 - 5 RBC/hpf 11-20  Squamous Epithelial / LPF Latest Ref Range: 0 - 5  0-5  WBC, UA Latest Ref Range: 0 - 5 WBC/hpf >50 (H)  URINE CULTURE Unknown Rpt    Procedures: NST  Hospital Course: The patient was admitted to Labor and Delivery Triage for observation.   Discharge Condition: good  Disposition: Discharge disposition: 01-Home or Self Care  Diet: Regular  diet  Discharge Activity: Activity as tolerated  Discharge Instructions     Discharge activity:  No Restrictions   Complete by: As directed    Discharge diet:  No restrictions   Complete by: As directed    Increase hydration   No sexual activity restrictions   Complete by: As directed    Notify physician for a general feeling that "something is not right"   Complete by: As directed    Notify physician for increase or change in vaginal discharge   Complete by: As directed    Notify physician for intestinal cramps, with or without diarrhea, sometimes described as "gas pain"   Complete by: As directed    Notify physician for leaking of fluid   Complete by: As directed    Notify physician for low, dull backache, unrelieved by heat or Tylenol   Complete by: As directed    Notify physician for menstrual like cramps   Complete by: As directed  Notify physician for pelvic pressure   Complete by: As directed    Notify physician for uterine contractions.  These may be painless and feel like the uterus is tightening or the baby is  "balling up"   Complete by: As directed    Notify physician for vaginal bleeding   Complete by: As directed    PRETERM LABOR:  Includes any of the follwing symptoms that occur between 20 - [redacted] weeks gestation.  If these symptoms are not stopped, preterm labor can result in preterm delivery, placing your baby at risk   Complete by: As directed       Allergies as of 08/07/2021   No Known Allergies      Medication List     TAKE these medications    prenatal multivitamin Tabs tablet Take 1 tablet by mouth daily at 12 noon.        Follow-up Information     [facility]. Go to.   Specialty: Obstetrics and Gynecology Why: scheduled appointment Contact information: [address] [place] [address] [phone]                Total time spent taking care of this patient: 20 minutes  Signed: Tresea Mall, CNM  08/07/2021, 6:50 AM

## 2021-08-07 NOTE — Progress Notes (Signed)
Patient reports increase in vaginal discharge. No change in color, odor, or consistency.

## 2021-08-07 NOTE — OB Triage Note (Signed)
Discussed plan of care with patient. Patient has not noted any bleeding since arrival to hospital. Discussed urine results read back to CNM and plan for urine culture and antibiotic prescription. EFM reassuring at this time, no contractions noted. Plan for patient to be discharged home. Encouraged patient to po hydrate. Patient to keep regularly scheduled appointment in the office unless needs to be seen sooner. Patient to return to hospital for decreased fetal movement, vaginal bleeding that soaks a pad, leaking of fluid, or contractions. Patient verbalized understanding and agreed to plan.

## 2021-08-08 LAB — URINE CULTURE

## 2021-08-13 ENCOUNTER — Ambulatory Visit (INDEPENDENT_AMBULATORY_CARE_PROVIDER_SITE_OTHER): Payer: Medicaid Other | Admitting: Advanced Practice Midwife

## 2021-08-13 ENCOUNTER — Other Ambulatory Visit: Payer: Self-pay

## 2021-08-13 ENCOUNTER — Other Ambulatory Visit (HOSPITAL_COMMUNITY)
Admission: RE | Admit: 2021-08-13 | Discharge: 2021-08-13 | Disposition: A | Discharge status: Home / Self Care | Payer: Medicaid Other | Source: Ambulatory Visit | Attending: Advanced Practice Midwife | Admitting: Advanced Practice Midwife

## 2021-08-13 ENCOUNTER — Encounter: Payer: Self-pay | Admitting: Advanced Practice Midwife

## 2021-08-13 VITALS — BP 120/70 | Wt 244.0 lb

## 2021-08-13 DIAGNOSIS — Z113 Encounter for screening for infections with a predominantly sexual mode of transmission: Secondary | ICD-10-CM | POA: Diagnosis present

## 2021-08-13 DIAGNOSIS — Z3480 Encounter for supervision of other normal pregnancy, unspecified trimester: Secondary | ICD-10-CM

## 2021-08-13 DIAGNOSIS — Z3685 Encounter for antenatal screening for Streptococcus B: Secondary | ICD-10-CM

## 2021-08-13 DIAGNOSIS — Z369 Encounter for antenatal screening, unspecified: Secondary | ICD-10-CM | POA: Diagnosis present

## 2021-08-13 DIAGNOSIS — Z3A36 36 weeks gestation of pregnancy: Secondary | ICD-10-CM | POA: Insufficient documentation

## 2021-08-13 DIAGNOSIS — Z23 Encounter for immunization: Secondary | ICD-10-CM

## 2021-08-13 LAB — OB RESULTS CONSOLE GC/CHLAMYDIA: Gonorrhea: NEGATIVE

## 2021-08-13 NOTE — Progress Notes (Signed)
Routine Prenatal Care Visit  Subjective  Joanne Pacheco is a 20 y.o. G2P1000 at [redacted]w[redacted]d being seen today for ongoing prenatal care.  She is currently monitored for the following issues for this low-risk pregnancy and has Abnormal LFTs; Obesity; Nausea/vomiting in pregnancy; History of cesarean section; Supervision of other normal pregnancy, antepartum; Short interval between pregnancies affecting pregnancy, antepartum; Maternal obesity affecting pregnancy, antepartum; Pain of round ligament affecting pregnancy, antepartum; Labor and delivery, indication for care; and Vaginal bleeding in pregnancy, third trimester on their problem list.  ----------------------------------------------------------------------------------- Patient reports no complaints.   Contractions: Not present. Vag. Bleeding: None.  Movement: Present. Leaking Fluid denies.  ----------------------------------------------------------------------------------- The following portions of the patient's history were reviewed and updated as appropriate: allergies, current medications, past family history, past medical history, past social history, past surgical history and problem list. Problem list updated.  Objective  Blood pressure 120/70, weight 244 lb (110.7 kg), last menstrual period 12/03/2020 [name] 241 lb (109.3 kg) Total Weight Gain 3 lb (1.361 kg) Urinalysis: Urine Protein    Urine Glucose    Fetal Status: Fetal Heart Rate (bpm): 139 Fundal Height: 36 cm Movement: Present     General:  Alert, oriented and cooperative. Patient is in no acute distress.  Skin: Skin is warm and dry. No rash noted.   Cardiovascular: Normal heart rate noted  Respiratory: Normal respiratory effort, no problems with respiration noted  Abdomen: Soft, gravid, appropriate for gestational age. Pain/Pressure: Absent     Pelvic:  Cervical exam performed 0.5/60/-3, GBS/aptima collected  Extremities: Normal range of motion.     Mental Status:  Normal mood and affect. Normal behavior. Normal judgment and thought content.   Assessment   [name] by  09/09/2021, by Last Menstrual Period presenting for routine prenatal visit  Plan   [name] (from 08/07/21 to present)    Problem Noted Resolved   Supervision of other normal pregnancy, antepartum 01/23/2021 by Zipporah Plants, CNM No   Overview Addendum 07/04/2021  2:27 PM by [name], MD     Nursing Staff Provider  Office Location  [facility] Dating   LMP = 10 wk [place]  Language  English Anatomy US  Normal female  Flu Vaccine   declined Genetic Screen  NIPS: normal xx  TDaP vaccine    Hgb A1C or  GTT Early : 65 Third trimester :   Rhogam   n/a   LAB RESULTS   Feeding Plan  breast Blood Type --/--/O POS (03/25 0855)   Contraception  Nexplanon Antibody NEG (03/25 0855)  Circumcision  n/a Rubella 1.83 (03/26 2027)  Pediatrician    RPR NON REACTIVE (03/26 2027)   Support Person  [name] HBsAg NON REACTIVE (03/25 1046)   Prenatal Classes   HIV NON REACTIVE (03/26 0532)    Varicella  immune  BTL Consent  n/a GBS  (For PCN allergy, check sensitivities)        VBAC Consent  desires Pap  n/a <21 yo    Hgb Electro   normal adult hgb    CF  negative   COVID-19  declined vax  had in 9/21 SMA  negative       History of preeclampsia History of cesarean, desires TOLAC History of infant loss at 1 month after delivery           Preterm labor symptoms and general obstetric precautions including but not limited to vaginal bleeding, contractions, leaking of fluid and fetal movement were  reviewed in detail with the patient. Please refer to After Visit Summary for other counseling recommendations.   Return in about 1 week (around 08/20/2021) for scheduled appointments.   Tresea Mall, CNM 08/13/2021 11:28 AM

## 2021-08-15 LAB — STREP GP B NAA: Strep Gp B NAA: POSITIVE — AB

## 2021-08-15 LAB — CERVICOVAGINAL ANCILLARY ONLY
Chlamydia: NEGATIVE
Comment: NEGATIVE
Comment: NEGATIVE
Comment: NORMAL
Neisseria Gonorrhea: NEGATIVE
Trichomonas: POSITIVE — AB

## 2021-08-19 ENCOUNTER — Ambulatory Visit (INDEPENDENT_AMBULATORY_CARE_PROVIDER_SITE_OTHER): Payer: Medicaid Other | Admitting: Advanced Practice Midwife

## 2021-08-19 ENCOUNTER — Encounter: Payer: Self-pay | Admitting: Advanced Practice Midwife

## 2021-08-19 ENCOUNTER — Encounter: Payer: Medicaid Other | Admitting: Obstetrics

## 2021-08-19 ENCOUNTER — Other Ambulatory Visit: Payer: Self-pay

## 2021-08-19 VITALS — BP 118/74 | Wt 248.0 lb

## 2021-08-19 DIAGNOSIS — Z3483 Encounter for supervision of other normal pregnancy, third trimester: Secondary | ICD-10-CM

## 2021-08-19 DIAGNOSIS — Z3A37 37 weeks gestation of pregnancy: Secondary | ICD-10-CM

## 2021-08-19 NOTE — Progress Notes (Signed)
ROB - no concerns. RM 4 °

## 2021-08-19 NOTE — Progress Notes (Signed)
Routine Prenatal Care Visit  Subjective  Joanne Pacheco is a 20 y.o. G2P1000 at [redacted]w[redacted]d being seen today for ongoing prenatal care.  She is currently monitored for the following issues for this low-risk pregnancy and has Abnormal LFTs; Obesity; Nausea/vomiting in pregnancy; History of cesarean section; Supervision of other normal pregnancy, antepartum; Short interval between pregnancies affecting pregnancy, antepartum; Maternal obesity affecting pregnancy, antepartum; Pain of round ligament affecting pregnancy, antepartum; Labor and delivery, indication for care; and Vaginal bleeding in pregnancy, third trimester on their problem list.  ----------------------------------------------------------------------------------- Patient reports no complaints. Breastfeeding class website info given.  Contractions: Not present. Vag. Bleeding: None.  Movement: Present. Leaking Fluid denies.  ----------------------------------------------------------------------------------- The following portions of the patient's history were reviewed and updated as appropriate: allergies, current medications, past family history, past medical history, past social history, past surgical history and problem list. Problem list updated.  Objective  Blood pressure 118/74, weight 248 lb (112.5 kg), last menstrual period 12/03/2020 [name] 241 lb (109.3 kg) Total Weight Gain 7 lb (3.175 kg) Urinalysis: Urine Protein    Urine Glucose    Fetal Status: Fetal Heart Rate (bpm): 133 Fundal Height: 37 cm Movement: Present     General:  Alert, oriented and cooperative. Patient is in no acute distress.  Skin: Skin is warm and dry. No rash noted.   Cardiovascular: Normal heart rate noted  Respiratory: Normal respiratory effort, no problems with respiration noted  Abdomen: Soft, gravid, appropriate for gestational age. Pain/Pressure: Absent     Pelvic:  Cervical exam deferred        Extremities: Normal range of motion.  Edema:  None  Mental Status: Normal mood and affect. Normal behavior. Normal judgment and thought content.   Assessment   [name] by  09/09/2021, by Last Menstrual Period presenting for routine prenatal visit  Plan   [name] (from 08/07/21 to present)    Problem Noted Resolved   Supervision of other normal pregnancy, antepartum 01/23/2021 by Zipporah Plants, CNM No   Overview Addendum 07/04/2021  2:27 PM by [name], MD     Nursing Staff Provider  Office Location  [facility] Dating   LMP = 10 wk [place]  Language  English Anatomy US  Normal female  Flu Vaccine   declined Genetic Screen  NIPS: normal xx  TDaP vaccine    Hgb A1C or  GTT Early : 65 Third trimester :   Rhogam   n/a   LAB RESULTS   Feeding Plan  breast Blood Type --/--/O POS (03/25 0855)   Contraception  Nexplanon Antibody NEG (03/25 0855)  Circumcision  n/a Rubella 1.83 (03/26 2027)  Pediatrician    RPR NON REACTIVE (03/26 2027)   Support Person  [name] HBsAg NON REACTIVE (03/25 1046)   Prenatal Classes   HIV NON REACTIVE (03/26 0532)    Varicella  immune  BTL Consent  n/a GBS  (For PCN allergy, check sensitivities)        VBAC Consent  desires Pap  n/a <21 yo    Hgb Electro   normal adult hgb    CF  negative   COVID-19  declined vax  had in 9/21 SMA  negative       History of preeclampsia History of cesarean, desires TOLAC History of infant loss at 1 month after delivery           Term labor symptoms and general obstetric precautions including but not limited to vaginal bleeding, contractions,  leaking of fluid and fetal movement were reviewed in detail with the patient. Please refer to After Visit Summary for other counseling recommendations.   Return in about 1 week (around 08/26/2021) for rob.  Tresea Mall, CNM 08/19/2021 1:56 PM

## 2021-08-20 ENCOUNTER — Other Ambulatory Visit: Payer: Self-pay | Admitting: Advanced Practice Midwife

## 2021-08-20 DIAGNOSIS — A599 Trichomoniasis, unspecified: Secondary | ICD-10-CM

## 2021-08-20 MED ORDER — METRONIDAZOLE 500 MG PO TABS: 500.0000 mg | ORAL_TABLET | Freq: Two times a day (BID) | ORAL | 0 refills | Status: AC

## 2021-08-20 NOTE — Progress Notes (Signed)
Rx metronidazole sent to treat Trichomonal infection

## 2021-08-26 ENCOUNTER — Other Ambulatory Visit: Payer: Self-pay

## 2021-08-26 ENCOUNTER — Encounter: Payer: Self-pay | Admitting: Advanced Practice Midwife

## 2021-08-26 ENCOUNTER — Ambulatory Visit (INDEPENDENT_AMBULATORY_CARE_PROVIDER_SITE_OTHER): Payer: Medicaid Other | Admitting: Advanced Practice Midwife

## 2021-08-26 VITALS — BP 112/68 | Wt 251.0 lb

## 2021-08-26 DIAGNOSIS — Z3A38 38 weeks gestation of pregnancy: Secondary | ICD-10-CM

## 2021-08-26 DIAGNOSIS — Z3483 Encounter for supervision of other normal pregnancy, third trimester: Secondary | ICD-10-CM

## 2021-08-26 NOTE — Progress Notes (Signed)
Routine Prenatal Care Visit  Subjective  Joanne Pacheco is a 20 y.o. G2P1000 at [redacted]w[redacted]d being seen today for ongoing prenatal care.  She is currently monitored for the following issues for this low-risk pregnancy and has Abnormal LFTs; Obesity; Nausea/vomiting in pregnancy; History of cesarean section; Supervision of other normal pregnancy, antepartum; Short interval between pregnancies affecting pregnancy, antepartum; Maternal obesity affecting pregnancy, antepartum; Pain of round ligament affecting pregnancy, antepartum; Labor and delivery, indication for care; and Vaginal bleeding in pregnancy, third trimester on their problem list.  ----------------------------------------------------------------------------------- Patient reports no complaints.  She is ready for baby to be born.  Contractions: Not present. Vag. Bleeding: Scant.  Movement: Present. Leaking Fluid denies.  ----------------------------------------------------------------------------------- The following portions of the patient's history were reviewed and updated as appropriate: allergies, current medications, past family history, past medical history, past social history, past surgical history and problem list. Problem list updated.  Objective  Blood pressure 112/68, weight 251 lb (113.9 kg), last menstrual period 12/03/2020 [name] 241 lb (109.3 kg) Total Weight Gain 10 lb (4.536 kg) Urinalysis: Urine Protein    Urine Glucose    Fetal Status: Fetal Heart Rate (bpm): 142 Fundal Height: 38 cm Movement: Present     General:  Alert, oriented and cooperative. Patient is in no acute distress.  Skin: Skin is warm and dry. No rash noted.   Cardiovascular: Normal heart rate noted  Respiratory: Normal respiratory effort, no problems with respiration noted  Abdomen: Soft, gravid, appropriate for gestational age. Pain/Pressure: Absent     Pelvic:  Cervical exam performed Dilation: Fingertip Effacement (%): 70 Station: -2,  sweep attempted, scant blood on exam glove  Extremities: Normal range of motion.  Edema: None  Mental Status: Normal mood and affect. Normal behavior. Normal judgment and thought content.   Assessment   [name] by  09/09/2021, by Last Menstrual Period presenting for routine prenatal visit  Plan   [name] (from 08/07/21 to present)    Problem Noted Resolved   Supervision of other normal pregnancy, antepartum 01/23/2021 by Zipporah Plants, CNM No   Overview Addendum 07/04/2021  2:27 PM by [name], MD     Nursing Staff Provider  Office Location  [facility] Dating   LMP = 10 wk [place]  Language  English Anatomy US  Normal female  Flu Vaccine   declined Genetic Screen  NIPS: normal xx  TDaP vaccine    Hgb A1C or  GTT Early : 65 Third trimester :   Rhogam   n/a   LAB RESULTS   Feeding Plan  breast Blood Type --/--/O POS (03/25 0855)   Contraception  Nexplanon Antibody NEG (03/25 0855)  Circumcision  n/a Rubella 1.83 (03/26 2027)  Pediatrician    RPR NON REACTIVE (03/26 2027)   Support Person  [name] HBsAg NON REACTIVE (03/25 1046)   Prenatal Classes   HIV NON REACTIVE (03/26 0532)    Varicella  immune  BTL Consent  n/a GBS  (For PCN allergy, check sensitivities)        VBAC Consent  desires Pap  n/a <21 yo    Hgb Electro   normal adult hgb    CF  negative   COVID-19  declined vax  had in 9/21 SMA  negative       History of preeclampsia History of cesarean, desires TOLAC History of infant loss at 1 month after delivery           Term labor symptoms  and general obstetric precautions including but not limited to vaginal bleeding, contractions, leaking of fluid and fetal movement were reviewed in detail with the patient. Please refer to After Visit Summary for other counseling recommendations.   Return in about 1 week (around 09/02/2021) for rob.  Tresea Mall, CNM 08/26/2021 12:15 PM

## 2021-08-26 NOTE — Progress Notes (Signed)
ROB - requesting cervical exam. RM 5 

## 2021-08-30 ENCOUNTER — Encounter: Payer: Self-pay | Admitting: Obstetrics & Gynecology

## 2021-08-30 ENCOUNTER — Observation Stay
Admission: EM | Admit: 2021-08-30 | Discharge: 2021-08-30 | Disposition: A | Discharge status: Home / Self Care | Payer: Medicaid Other | Attending: Obstetrics & Gynecology | Admitting: Obstetrics & Gynecology

## 2021-08-30 ENCOUNTER — Other Ambulatory Visit: Payer: Self-pay

## 2021-08-30 DIAGNOSIS — R1032 Left lower quadrant pain: Secondary | ICD-10-CM

## 2021-08-30 DIAGNOSIS — Z3480 Encounter for supervision of other normal pregnancy, unspecified trimester: Secondary | ICD-10-CM

## 2021-08-30 DIAGNOSIS — Z87891 Personal history of nicotine dependence: Secondary | ICD-10-CM | POA: Insufficient documentation

## 2021-08-30 DIAGNOSIS — E669 Obesity, unspecified: Secondary | ICD-10-CM | POA: Diagnosis not present

## 2021-08-30 DIAGNOSIS — Z3A38 38 weeks gestation of pregnancy: Secondary | ICD-10-CM | POA: Diagnosis not present

## 2021-08-30 DIAGNOSIS — O99213 Obesity complicating pregnancy, third trimester: Secondary | ICD-10-CM | POA: Diagnosis not present

## 2021-08-30 DIAGNOSIS — O26893 Other specified pregnancy related conditions, third trimester: Secondary | ICD-10-CM | POA: Diagnosis present

## 2021-08-30 LAB — URINALYSIS, COMPLETE (UACMP) WITH MICROSCOPIC
Bilirubin Urine: NEGATIVE
Glucose, UA: NEGATIVE mg/dL
Hgb urine dipstick: NEGATIVE
Ketones, ur: NEGATIVE mg/dL
Nitrite: NEGATIVE
Protein, ur: NEGATIVE mg/dL
Specific Gravity, Urine: 1.012 (ref 1.005–1.030)
pH: 6 (ref 5.0–8.0)

## 2021-08-30 NOTE — Discharge Summary (Signed)
Please see Final Progress Note  Mirna Mires, CNM  08/30/2021 9:49 PM

## 2021-08-30 NOTE — OB Triage Note (Signed)
Patient came in for observation for lower abdominal pain and back pain for the last 2 days but got more intense around 1900. Patient reports pain 8/10. Patient denies uterine contractions, LOF, and vaginal bleeding. Patient reports +FM. Vital signs stable and patient afebrile. FHR baseline 135 with moderate variability with accelerations 15 x 15 and no decelerations. Patient in the room by herself. Will continue to monitor.

## 2021-08-30 NOTE — Final Progress Note (Signed)
Final Progress Note  Patient ID: Joanne Pacheco MRN: [id number] DOB/AGE: 07-Dec-2000 20 y.o.  Admit date: 08/30/2021 Admitting provider: Mirna Mires, CNM Discharge date: 08/30/2021   Admission Diagnoses: lower abdominal pain in pregnancy  Discharge Diagnoses:  Active Problems:   Left lower quadrant abdominal pain affecting pregnancy in third trimester  Reactive fetal heart tones Round ligament pain  History of Present Illness: The patient is a 20 y.o. female G2P1000 at [redacted]w[redacted]d who presents for complaint of lower abdominal and pelvic pressure.  She denies contractions, vaginal bleeding or leaking of fluid.. Was seen 4 days ago in the office and had a cervical sweep performed. Her OB Hx is significant for a previous C section. She desires TOLAC.  Past Medical History:  Diagnosis Date   Hyperemesis 02/08/2020   Medical history non-contributory    Obesity     Past Surgical History:  Procedure Laterality Date   CESAREAN SECTION     07/18/2020   CHOLECYSTECTOMY     2021 (at [redacted] weeks gestation)    No current facility-administered medications on file prior to encounter.   Current Outpatient Medications on File Prior to Encounter  Medication Sig Dispense Refill   Prenatal Vit-Fe Fumarate-FA (PRENATAL MULTIVITAMIN) TABS tablet Take 1 tablet by mouth daily at 12 noon.      No Known Allergies  Social History   Socioeconomic History   Marital status: Single    Spouse name: Not on file   Number of children: Not on file   Years of education: Not on file   Highest education level: Not on file  Occupational History   Not on file  Tobacco Use   Smoking status: Former    Types: Cigarettes    Quit date: 01/14/2021    Years since quitting: 0.6   Smokeless tobacco: Never  Vaping Use   Vaping Use: Never used  Substance and Sexual Activity   Alcohol use: Not Currently    Comment: last use 01/02/21- one glass wine   Drug use: Never   Sexual activity: Yes    Birth  control/protection: None  Other Topics Concern   Not on file  Social History Narrative   Not on file   Social Determinants of Health   Financial Resource Strain: Not on file  Food Insecurity: Not on file  Transportation Needs: Not on file  Physical Activity: Not on file  Stress: Not on file  Social Connections: Not on file  Intimate Partner Violence: Not At Risk   Fear of Current or Ex-Partner: No   Emotionally Abused: No   Physically Abused: No   Sexually Abused: No    History reviewed. No pertinent family history.   Review of Systems  Constitutional: Negative.   HENT: Negative.    Eyes: Negative.   Respiratory: Negative.    Cardiovascular: Negative.   Gastrointestinal: Negative.   Genitourinary: Negative.   Skin: Negative.   All other systems reviewed and are negative.   Physical Exam: LMP 12/03/2020 (Exact Date)   Physical Exam Constitutional:      Appearance: Normal appearance. She is obese.  Genitourinary:     Vulva normal.     Genitourinary Comments:  SVE is FT/50%/-3.  HENT:     Head: Normocephalic and atraumatic.  Cardiovascular:     Rate and Rhythm: Normal rate and regular rhythm.  Pulmonary:     Effort: Pulmonary effort is normal.     Breath sounds: Normal breath sounds.  Abdominal:     Palpations: Abdomen  is soft.  Musculoskeletal:        General: Normal range of motion.     Cervical back: Normal range of motion and neck supple.  Neurological:     General: No focal deficit present.     Mental Status: She is alert and oriented to person, place, and time.  Skin:    General: Skin is warm and dry.  Psychiatric:        Mood and Affect: Mood normal.        Behavior: Behavior normal.    Consults: None  Significant Findings/ Diagnostic Studies: labs:  Results for orders placed or performed during the hospital encounter of 08/30/21 (from the past 24 hour(s))  Urinalysis, Complete w Microscopic Urine, Clean Catch     Status: Abnormal   Collection  Time: 08/30/21  9:11 PM  Result Value Ref Range   Color, Urine YELLOW (A) YELLOW   APPearance HAZY (A) CLEAR   Specific Gravity, Urine 1.012 1.005 - 1.030   pH 6.0 5.0 - 8.0   Glucose, UA NEGATIVE NEGATIVE mg/dL   Hgb urine dipstick NEGATIVE NEGATIVE   Bilirubin Urine NEGATIVE NEGATIVE   Ketones, ur NEGATIVE NEGATIVE mg/dL   Protein, ur NEGATIVE NEGATIVE mg/dL   Nitrite NEGATIVE NEGATIVE   Leukocytes,Ua TRACE (A) NEGATIVE   RBC / HPF 0-5 0 - 5 RBC/hpf   WBC, UA 0-5 0 - 5 WBC/hpf   Bacteria, UA RARE (A) NONE SEEN   Squamous Epithelial / LPF 6-10 0 - 5   Mucus PRESENT      Procedures: EFM NST Baseline FHR: 135 beats/min Variability: moderate Accelerations: present Decelerations: present Tocometry: no contractions noted  Interpretation:  INDICATIONS: rule out uterine contractions RESULTS:  A NST procedure was performed with FHR monitoring and a normal baseline established, appropriate time of 20-40 minutes of evaluation, and accels >2 seen w 15x15 characteristics.  Results show a REACTIVE NST.    Hospital Course: The patient was admitted to Labor and Delivery Triage for observation. She was monitored, and the FHTS were reactive with moderate variability and numerous accels. No regular contractions were noted or reported by the patient. A cervical exam was performed, and she is not dilated. With reassurance, she is discharged home with plans for follow up in a few days at [facility].  Discharge Condition: good  Disposition: Discharge disposition: 01-Home or Self Care       Diet: Regular diet  Discharge Activity: Activity as tolerated  Discharge Instructions     Fetal Kick Count:  Lie on our left side for one hour after a meal, and count the number of times your baby kicks.  If it is less than 5 times, get up, move around and drink some juice.  Repeat the test 30 minutes later.  If it is still less than 5 kicks in an hour, notify your doctor.   Complete by: As  directed    LABOR:  When conractions begin, you should start to time them from the beginning of one contraction to the beginning  of the next.  When contractions are 5 - 10 minutes apart or less and have been regular for at least an hour, you should call your health care provider.   Complete by: As directed    Notify physician for bleeding from the vagina   Complete by: As directed    Notify physician for blurring of vision or spots before the eyes   Complete by: As directed    Notify  physician for chills or fever   Complete by: As directed    Notify physician for fainting spells, "black outs" or loss of consciousness   Complete by: As directed    Notify physician for increase in vaginal discharge   Complete by: As directed    Notify physician for leaking of fluid   Complete by: As directed    Notify physician for pain or burning when urinating   Complete by: As directed    Notify physician for pelvic pressure (sudden increase)   Complete by: As directed    Notify physician for severe or continued nausea or vomiting   Complete by: As directed    Notify physician for sudden gushing of fluid from the vagina (with or without continued leaking)   Complete by: As directed    Notify physician for sudden, constant, or occasional abdominal pain   Complete by: As directed    Notify physician if baby moving less than usual   Complete by: As directed       Allergies as of 08/30/2021   No Known Allergies      Medication List     TAKE these medications    prenatal multivitamin Tabs tablet Take 1 tablet by mouth daily at 12 noon.         Total time spent taking care of this patient: 30 minutes  Signed: Mirna Mires, CNM  08/30/2021, 9:49 PM

## 2021-09-02 ENCOUNTER — Ambulatory Visit (INDEPENDENT_AMBULATORY_CARE_PROVIDER_SITE_OTHER): Payer: Medicaid Other | Admitting: Obstetrics and Gynecology

## 2021-09-02 ENCOUNTER — Other Ambulatory Visit: Payer: Self-pay

## 2021-09-02 ENCOUNTER — Encounter: Payer: Self-pay | Admitting: Obstetrics and Gynecology

## 2021-09-02 VITALS — BP 118/70 | Ht 67.0 in | Wt 251.6 lb

## 2021-09-02 DIAGNOSIS — Z3480 Encounter for supervision of other normal pregnancy, unspecified trimester: Secondary | ICD-10-CM

## 2021-09-02 DIAGNOSIS — Z3A39 39 weeks gestation of pregnancy: Secondary | ICD-10-CM

## 2021-09-02 NOTE — Progress Notes (Signed)
Routine Prenatal Care Visit  Subjective  Joanne Pacheco is a 20 y.o. G2P1000 at [redacted]w[redacted]d being seen today for ongoing prenatal care.  She is currently monitored for the following issues for this high-risk pregnancy and has Abnormal LFTs; Obesity; Nausea/vomiting in pregnancy; History of cesarean section; Supervision of other normal pregnancy, antepartum; Short interval between pregnancies affecting pregnancy, antepartum; Maternal obesity affecting pregnancy, antepartum; Pain of round ligament affecting pregnancy, antepartum; Labor and delivery, indication for care; Vaginal bleeding in pregnancy, third trimester; and Left lower quadrant abdominal pain affecting pregnancy in third trimester on their problem list.  ----------------------------------------------------------------------------------- Patient reports  She has been feeling well . She has been considering delivery planning. She would like to be induced as soon as possible after her due date if she does not go into labor prior to that. She is increasingly uncomfortable. She would like to have a TOLAC.    Contractions: Irregular. Vag. Bleeding: None.  Movement: Present. Denies leaking of fluid.  ----------------------------------------------------------------------------------- The following portions of the patient's history were reviewed and updated as appropriate: allergies, current medications, past family history, past medical history, past social history, past surgical history and problem list. Problem list updated.   Objective  Blood pressure 118/70, height 5\' 7"  (1.702 m), weight 251 lb 9.6 oz (114.1 kg), last menstrual period 12/03/2020, unknown if currently breastfeeding. [name] 241 lb (109.3 kg) Total Weight Gain 10 lb 9.6 oz (4.808 kg) Urinalysis:      Fetal Status: Fetal Heart Rate (bpm): 125 Fundal Height: 39 cm Movement: Present     General:  Alert, oriented and cooperative. Patient is in no acute distress.  Skin:  Skin is warm and dry. No rash noted.   Cardiovascular: Normal heart rate noted  Respiratory: Normal respiratory effort, no problems with respiration noted  Abdomen: Soft, gravid, appropriate for gestational age. Pain/Pressure: Absent     Pelvic:  Cervical exam performed Dilation: 1 Effacement (%): 20 Station: -3  Extremities: Normal range of motion.  Edema: None  Mental Status: Normal mood and affect. Normal behavior. Normal judgment and thought content.     Assessment   [name] by  09/09/2021, by Last Menstrual Period presenting for routine prenatal visit  Plan   [name] (from 08/07/21 to present)     Problem Noted Resolved   Supervision of other normal pregnancy, antepartum 01/23/2021 by 03/25/2021, CNM No   Overview Addendum 09/02/2021 11:24 AM by 09/04/2021, MD     Nursing Staff Provider  Office Location  [facility] Dating   LMP = 10 wk Natale Milch  Language  English Anatomy US  Normal female  Flu Vaccine   declined Genetic Screen  NIPS: normal xx  TDaP vaccine   08/13/2021 Hgb A1C or  GTT Early : 65 Third trimester : nml  Rhogam   n/a   LAB RESULTS   Feeding Plan  breast Blood Type --/--/O POS (03/25 0855)   Contraception  Nexplanon Antibody NEG (03/25 0855)  Circumcision  n/a Rubella 1.83 (03/26 2027)  Pediatrician    RPR NON REACTIVE (03/26 2027)   Support Person  [name] HBsAg NON REACTIVE (03/25 1046)   Prenatal Classes   HIV NON REACTIVE (03/26 0532)    Varicella  immune  BTL Consent  n/a GBS  positive       VBAC Consent  desires Pap  n/a <21 yo    Hgb Electro   normal adult hgb    CF  negative   COVID-19  declined vax  had in 9/21 SMA  negative       History of preeclampsia History of cesarean, desires TOLAC History of infant loss at 1 month after delivery              20 y.o. G2P1000 at [redacted]w[redacted]d with Estimated Date of Delivery: 09/09/21 was seen today in office to discuss trial of labor after cesarean section (TOLAC) versus  elective repeat cesarean delivery (ERCD). The following risks were discussed with the patient.  Risk of uterine rupture at term is 0.78 percent with TOLAC and 0.22 percent with ERCD. 1 in 10 uterine ruptures will result in neonatal death or neurological injury. The benefits of a trial of labor after cesarean (TOLAC) resulting in a vaginal birth after cesarean (VBAC) include the following: shorter length of hospital stay and postpartum recovery (in most cases); fewer complications, such as postpartum fever, wound or uterine infection, thromboembolism (blood clots in the leg or lung), need for blood transfusion and fewer neonatal breathing problems. The risks of an attempted VBAC or TOLAC include the following: Risk of failed trial of labor after cesarean (TOLAC) without a vaginal birth after cesarean (VBAC) resulting in repeat cesarean delivery (RCD) in about 20 to 40 percent of women who attempt VBAC.  Her individualized success rate using the MFMU VBAC risk calculator is 62.3%.     Risk of rupture of uterus resulting in an emergency cesarean delivery. The risk of uterine rupture may be related in part to the type of uterine incision made during the first cesarean delivery. A previous transverse uterine incision has the lowest risk of rupture (0.2 to 1.5 percent risk). Vertical or T-shaped uterine incisions have a higher risk of uterine rupture (4 to 9 percent risk)The risk of fetal death is very low with both VBAC and elective repeat cesarean delivery (ERCD), but the likelihood of fetal death is higher with VBAC than with ERCD. Maternal death is very rare with either type of delivery. The risks of an elective repeat cesarean delivery (ERCD) were reviewed with the patient including but not limited to: 12/998 risk of uterine rupture which could have serious consequences, bleeding which may require transfusion; infection which may require antibiotics; injury to bowel, bladder or other surrounding organs  (bowel, bladder, ureters); injury to the fetus; need for additional procedures including hysterectomy in the event of a life-threatening hemorrhage; thromboembolic phenomenon; abnormal placentation; incisional problems; death and other postoperative or anesthesia complications.    In addition we discussed that our collective office practice is to allow patient's who desire to attempt TOLAC to go into labor naturally.  There is some limited data that rupture rate may increase past [redacted] weeks gestation, but it is reasonable for women who are strongly committed to [facility] to continue pregnancy into the 41st week.  Medical indications necessetating early delivery may arise during the course of any pregnancy.  Given the contraindication on the use of prostaglandins for use in cervical ripening,  recommendation would be to proceed with repeat cesarean for delivery for patient's with unfavorable cervix (low Bishops score) who reach 41 weeks or who otherwise have a medical indication for early delivery.   These risks and benefits are summarized on the consent form, which was reviewed with the patient during the visit.  All her questions answered and she signed a consent indicating a preference for TOLAC/ERCD. A copy of the consent was given to the patient.  IOL scheduled for 09/10/2021, IOL orders  placed.  Patient provided with ACOG IOL handout and a copy of the above information.  Membranes swept at maternal request.  Gestational age appropriate obstetric precautions including but not limited to vaginal bleeding, contractions, leaking of fluid and fetal movement were reviewed in detail with the patient.    Return in about 6 days (around 09/08/2021) for ROB in person.  Natale Milch MD [facility], [facility] 09/02/2021, 3:13 PM

## 2021-09-02 NOTE — Patient Instructions (Signed)
Covid Testing at 09/08/2021 between 8-10:30 am, Testing in the [facility]. Follow signs for Pre-admission testing. Please wear a mask.  Induction 09/10/2021 at 8 AM . Enter through the [facility] for an 8 AM induction. Please enter through the ER for a midnight or 5 AM induction.  Please eat a meal prior to your arrival.    Labor Induction Labor induction is when steps are taken to cause a pregnant woman to begin the labor process. Most women go into labor on their own between 37 weeks and 42 weeks of pregnancy. When this does not happen, or when there is a medical need for labor to begin, steps may be taken to induce, or bring on, labor. Labor induction causes a pregnant woman's uterus to contract. It also causes the cervix to soften (ripen), open (dilate), and thin out. Usually, labor is not induced before 39 weeks of pregnancy unless there is a medical reason to do so. When is labor induction considered? Labor induction may be right for you if: Your pregnancy lasts longer than 41 to 42 weeks. Your placenta is separating from your uterus (placental abruption). You have a rupture of membranes and your labor does not begin. You have health problems, like diabetes or high blood pressure (preeclampsia) during your pregnancy. Your baby has stopped growing or does not have enough amniotic fluid. Before labor induction begins, your health care provider will consider the following factors: Your medical condition and the baby's condition. How many weeks you have been pregnant. How mature the baby's lungs are. The condition of your cervix. The position of the baby. The size of your birth canal. Tell a health care provider about: Any allergies you have. All medicines you are taking, including vitamins, herbs, eye drops, creams, and over-the-counter medicines. Any problems you or your family members have had with anesthetic medicines. Any surgeries you have had. Any blood disorders you  have. Any medical conditions you have. What are the risks? Generally, this is a safe procedure. However, problems may occur, including: Failed induction. Changes in fetal heart rate, such as being too high, too low, or irregular (erratic). Infection in the mother or the baby. Increased risk of having a cesarean delivery. Breaking off (abruption) of the placenta from the uterus. This is rare. Rupture of the uterus. This is very rare. Your baby could fail to get enough blood flow or oxygen. This can be life-threatening. When induction is needed for medical reasons, the benefits generally outweigh the risks. What happens during the procedure? During the procedure, your health care provider will use one of these methods to induce labor: Stripping the membranes. In this method, the amniotic sac tissue is gently separated from the cervix. This causes the following to happen: Your cervix stretches, which in turn causes the release of prostaglandins. Prostaglandins induce labor and cause the uterus to contract. This procedure is often done in an office visit. You will be sent home to wait for contractions to begin. Prostaglandin medicine. This medicine starts contractions and causes the cervix to dilate and ripen. This can be taken by mouth (orally) or by being inserted into the vagina (suppository). Inserting a small, thin tube (catheter) with a balloon into the vagina and then expanding the balloon with water to dilate the cervix. Breaking the water. In this method, a small instrument is used to make a small hole in the amniotic sac. This eventually causes the amniotic sac to break. Contractions should begin within a few hours. Medicine  to trigger or strengthen contractions. This medicine is given through an IV that is inserted into a vein in your arm. This procedure may vary among health care providers and hospitals. Where to find more information [employer]: www.marchofdimes.org [employer]: www.acog.org Summary Labor induction causes a pregnant woman's uterus to contract. It also causes the cervix to soften (ripen), open (dilate), and thin out. Labor is usually not induced before 39 weeks of pregnancy unless there is a medical reason to do so. When induction is needed for medical reasons, the benefits generally outweigh the risks. Talk with your health care provider about which methods of labor induction are right for you. This information is not intended to replace advice given to you by your health care provider. Make sure you discuss any questions you have with your health care provider. Document Revised: 08/15/2020 Document Reviewed: 08/15/2020 Elsevier Patient Education  2022 Elsevier Inc. Pain Relief During Labor and Delivery Many things can cause pain during labor and delivery, including: Pressure due to the baby moving through the pelvis. Stretching of tissues due to the baby moving through the birth canal. Muscle tension due to anxiety or nervousness. The uterus tightening (contracting)and relaxing to help move the baby. How do I get pain relief during labor and delivery? Discuss your pain relief options with your health care provider during your prenatal visits. Explore the options offered by your hospital or birth center. There are many ways to deal with the pain of labor and delivery. You can try relaxation techniques or doing relaxing activities, taking a warm shower or bath (hydrotherapy), or other methods. There are also many medicines available to help control pain. Relaxation techniques and activities Practice relaxation techniques or do relaxing activities, such as: Focused breathing. Meditation. Visualization. Aroma therapy. Listening to your favorite music. Hypnosis. Hydrotherapy Take a warm shower or bath. This may: Provide comfort and relaxation. Lessen your feeling of pain. Reduce the amount of pain  medicine needed. Shorten the length of labor. Other methods Try doing other things, such as: Getting a massage or having counterpressure on your back. Applying warm packs or ice packs. Changing positions often, moving around, or using a birthing ball. Medicines You may be given: Pain medicine through an IV or an injection into a muscle. Pain medicine inserted into your spinal column. Injections of sterile water just under the skin on your lower back. Nitrous oxide inhalation therapy, also called laughing gas. What kinds of medicine are available for pain relief? There are two kinds of medicines that can be used to relieve pain during labor and delivery: Analgesics. These medicines decrease pain without causing you to lose feeling or the ability to move your muscles. Anesthetics. These medicines block feeling in the body and can decrease your ability to move freely. Both kinds of medicine can cause minor side effects, such as nausea, trouble concentrating, and sleepiness. They can also affect the baby's heart rate before birth and his or her breathing after birth. For this reason, health care providers are careful about when and how much medicine is given. Which medicines are used to provide pain relief? Common medicines The most common medicines used to help manage pain during labor and delivery include: Opioids. Opioids are medicines that decrease how much pain you feel (perception of pain). These medicines can be given through an IV or may be used with anesthetics to block pain. Epidural analgesia. Epidural analgesia is given through a very thin tube that  is inserted into the lower back. Medicine is delivered continuously to the area near your spinal column nerves (epidural space). After having this treatment, you may be able to move your legs, but you will not be able to walk. Depending on the amount and type of medicine given, you may lose all feeling in the lower half of your body, or you  may have some sensation, including the urge to push. This treatment can be used to give pain relief for a vaginal birth. Sometimes, a numbing medicine is injected into the spinal fluid when an epidural catheter is placed. This provides for immediate relief but only lasts for 1-2 hours. Once it wears off, the epidural will provide pain relief. This is called a combined spinal-epidural (CSE) block. Intrathecal analgesia (spinal analgesia). Intrathecal analgesia is similar to epidural analgesia, but the medicine is injected into the spinal fluid instead of the epidural space. It is usually only given once. It starts to relieve pain quickly, but the pain relief lasts only 1-2 hours. Pudendal block. This block is done by injecting numbing medicine through the wall of the vagina and into a nerve in the pelvis. Other medicines Other medicines used to help manage pain during labor and delivery include: Local anesthetics. These are used to numb a small area of the body. They may be used along with another kind of medicine or used to numb the nerves of the vagina, cervix, and perineum during the second stage of labor. Spinal block (spinal anesthesia). Spinal anesthesia is similar to spinal analgesia, but the medicine that is used contains longer-acting numbing medicines and pain medicines. This type of anesthesia can be used for a cesarean delivery and allows you to stay awake for the birth of your baby. General anesthetics cause you to lose consciousness so you do not feel pain. They are usually only used for an emergency cesarean delivery. These medicines are given through an IV or a mask or both. These medicines are used as part of a procedure or for an emergency delivery. Summary Women have many options to help them manage the pain associated with labor and delivery. You can try doing relaxing activities, taking a warm shower or bath, or other methods. There are also many medicines available to help control  pain during labor and delivery. Talk with your health care provider about what options are available to you. This information is not intended to replace advice given to you by your health care provider. Make sure you discuss any questions you have with your health care provider. Document Revised: 09/20/2019 Document Reviewed: 09/20/2019 Elsevier Patient Education  2022 Elsevier Inc.     [name] with Estimated Date of Delivery: 09/09/21 was seen today in office to discuss trial of labor after cesarean section (TOLAC) versus elective repeat cesarean delivery (ERCD). The following risks were discussed with the patient.  Risk of uterine rupture at term is 0.78 percent with TOLAC and 0.22 percent with ERCD. 1 in 10 uterine ruptures will result in neonatal death or neurological injury. The benefits of a trial of labor after cesarean (TOLAC) resulting in a vaginal birth after cesarean (VBAC) include the following: shorter length of hospital stay and postpartum recovery (in most cases); fewer complications, such as postpartum fever, wound or uterine infection, thromboembolism (blood clots in the leg or lung), need for blood transfusion and fewer neonatal breathing problems. The risks of an attempted VBAC or TOLAC include the following: Risk of failed trial of labor  after cesarean (TOLAC) without a vaginal birth after cesarean (VBAC) resulting in repeat cesarean delivery (RCD) in about 20 to 40 percent of women who attempt VBAC.  Her individualized success rate using the MFMU VBAC risk calculator is 62.3%.     Risk of rupture of uterus resulting in an emergency cesarean delivery. The risk of uterine rupture may be related in part to the type of uterine incision made during the first cesarean delivery. A previous transverse uterine incision has the lowest risk of rupture (0.2 to 1.5 percent risk). Vertical or T-shaped uterine incisions have a higher risk of uterine rupture (4 to 9 percent  risk)The risk of fetal death is very low with both VBAC and elective repeat cesarean delivery (ERCD), but the likelihood of fetal death is higher with VBAC than with ERCD. Maternal death is very rare with either type of delivery. The risks of an elective repeat cesarean delivery (ERCD) were reviewed with the patient including but not limited to: 12/998 risk of uterine rupture which could have serious consequences, bleeding which may require transfusion; infection which may require antibiotics; injury to bowel, bladder or other surrounding organs (bowel, bladder, ureters); injury to the fetus; need for additional procedures including hysterectomy in the event of a life-threatening hemorrhage; thromboembolic phenomenon; abnormal placentation; incisional problems; death and other postoperative or anesthesia complications.    In addition we discussed that our collective office practice is to allow patient's who desire to attempt TOLAC to go into labor naturally.  There is some limited data that rupture rate may increase past [redacted] weeks gestation, but it is reasonable for women who are strongly committed to [facility] to continue pregnancy into the 41st week.  Medical indications necessetating early delivery may arise during the course of any pregnancy.  Given the contraindication on the use of prostaglandins for use in cervical ripening,  recommendation would be to proceed with repeat cesarean for delivery for patient's with unfavorable cervix (low Bishops score) who reach 41 weeks or who otherwise have a medical indication for early delivery.   These risks and benefits are summarized on the consent form, which was reviewed with the patient during the visit.  All her questions answered and she signed a consent indicating a preference for TOLAC/ERCD. A copy of the consent was given to the patient.

## 2021-09-04 ENCOUNTER — Other Ambulatory Visit: Payer: Self-pay

## 2021-09-04 ENCOUNTER — Inpatient Hospital Stay
Admission: EM | Admit: 2021-09-04 | Discharge: 2021-09-06 | DRG: 807 | Disposition: A | Discharge status: Home / Self Care | Payer: Medicaid Other | Attending: Obstetrics and Gynecology | Admitting: Obstetrics and Gynecology

## 2021-09-04 ENCOUNTER — Encounter: Payer: Self-pay | Admitting: Obstetrics and Gynecology

## 2021-09-04 DIAGNOSIS — Z3A39 39 weeks gestation of pregnancy: Secondary | ICD-10-CM

## 2021-09-04 DIAGNOSIS — O26893 Other specified pregnancy related conditions, third trimester: Secondary | ICD-10-CM | POA: Diagnosis present

## 2021-09-04 DIAGNOSIS — Z98891 History of uterine scar from previous surgery: Secondary | ICD-10-CM

## 2021-09-04 DIAGNOSIS — O4292 Full-term premature rupture of membranes, unspecified as to length of time between rupture and onset of labor: Principal | ICD-10-CM | POA: Diagnosis present

## 2021-09-04 DIAGNOSIS — O34219 Maternal care for unspecified type scar from previous cesarean delivery: Secondary | ICD-10-CM | POA: Diagnosis present

## 2021-09-04 DIAGNOSIS — O99824 Streptococcus B carrier state complicating childbirth: Secondary | ICD-10-CM | POA: Diagnosis present

## 2021-09-04 DIAGNOSIS — Z3480 Encounter for supervision of other normal pregnancy, unspecified trimester: Secondary | ICD-10-CM

## 2021-09-04 DIAGNOSIS — B951 Streptococcus, group B, as the cause of diseases classified elsewhere: Secondary | ICD-10-CM | POA: Diagnosis not present

## 2021-09-04 DIAGNOSIS — N898 Other specified noninflammatory disorders of vagina: Secondary | ICD-10-CM | POA: Diagnosis present

## 2021-09-04 DIAGNOSIS — Z87891 Personal history of nicotine dependence: Secondary | ICD-10-CM

## 2021-09-04 DIAGNOSIS — O98813 Other maternal infectious and parasitic diseases complicating pregnancy, third trimester: Secondary | ICD-10-CM | POA: Diagnosis not present

## 2021-09-04 DIAGNOSIS — Z20822 Contact with and (suspected) exposure to covid-19: Secondary | ICD-10-CM | POA: Diagnosis present

## 2021-09-04 DIAGNOSIS — O4202 Full-term premature rupture of membranes, onset of labor within 24 hours of rupture: Secondary | ICD-10-CM | POA: Diagnosis not present

## 2021-09-04 DIAGNOSIS — Z8616 Personal history of COVID-19: Secondary | ICD-10-CM

## 2021-09-04 DIAGNOSIS — O09899 Supervision of other high risk pregnancies, unspecified trimester: Secondary | ICD-10-CM

## 2021-09-04 LAB — TYPE AND SCREEN
ABO/RH(D): O POS
Antibody Screen: NEGATIVE

## 2021-09-04 LAB — CBC
HCT: 33.5 % — ABNORMAL LOW (ref 36.0–46.0)
Hemoglobin: 11.7 g/dL — ABNORMAL LOW (ref 12.0–15.0)
MCH: 30.9 pg (ref 26.0–34.0)
MCHC: 34.9 g/dL (ref 30.0–36.0)
MCV: 88.4 fL (ref 80.0–100.0)
Platelets: 249 10*3/uL (ref 150–400)
RBC: 3.79 MIL/uL — ABNORMAL LOW (ref 3.87–5.11)
RDW: 14.5 % (ref 11.5–15.5)
WBC: 7.3 10*3/uL (ref 4.0–10.5)
nRBC: 0 % (ref 0.0–0.2)

## 2021-09-04 LAB — RESP PANEL BY RT-PCR (FLU A&B, COVID) ARPGX2
Influenza A by PCR: NEGATIVE
Influenza B by PCR: NEGATIVE
SARS Coronavirus 2 by RT PCR: NEGATIVE

## 2021-09-04 LAB — RUPTURE OF MEMBRANE (ROM)PLUS: Rom Plus: POSITIVE

## 2021-09-04 MED ORDER — TERBUTALINE SULFATE 1 MG/ML IJ SOLN: 0.2500 mg | Freq: Once | INTRAMUSCULAR | Status: DC | PRN

## 2021-09-04 MED ORDER — LIDOCAINE HCL (PF) 1 % IJ SOLN
INTRAMUSCULAR | Status: AC
  Filled 2021-09-04: qty 30

## 2021-09-04 MED ORDER — LACTATED RINGERS IV SOLN
500.0000 mL | INTRAVENOUS | Status: DC | PRN
Start: 2021-09-04 — End: 2021-09-05

## 2021-09-04 MED ORDER — LACTATED RINGERS IV SOLN: INTRAVENOUS | Status: DC

## 2021-09-04 MED ORDER — OXYTOCIN-SODIUM CHLORIDE 30-0.9 UT/500ML-% IV SOLN: 1.0000 m[IU]/min | INTRAVENOUS | Status: DC

## 2021-09-04 MED ORDER — AMMONIA AROMATIC IN INHA
RESPIRATORY_TRACT | Status: AC
  Filled 2021-09-04: qty 10

## 2021-09-04 MED ORDER — OXYTOCIN BOLUS FROM INFUSION
333.0000 mL | Freq: Once | INTRAVENOUS | Status: AC
  Administered 2021-09-05: 333 mL via INTRAVENOUS

## 2021-09-04 MED ORDER — MISOPROSTOL 200 MCG PO TABS
ORAL_TABLET | ORAL | Status: AC
  Filled 2021-09-04: qty 4

## 2021-09-04 MED ORDER — SOD CITRATE-CITRIC ACID 500-334 MG/5ML PO SOLN: 30.0000 mL | ORAL | Status: DC | PRN

## 2021-09-04 MED ORDER — ACETAMINOPHEN 325 MG PO TABS: 650.0000 mg | ORAL_TABLET | ORAL | Status: DC | PRN

## 2021-09-04 MED ORDER — OXYTOCIN 10 UNIT/ML IJ SOLN
INTRAMUSCULAR | Status: AC
  Filled 2021-09-04: qty 2

## 2021-09-04 MED ORDER — PENICILLIN G POTASSIUM 5000000 UNITS IJ SOLR
5.0000 10*6.[IU] | Freq: Once | INTRAMUSCULAR | Status: AC
  Administered 2021-09-04: 5 10*6.[IU] via INTRAVENOUS
  Filled 2021-09-04: qty 5

## 2021-09-04 MED ORDER — PENICILLIN G POT IN DEXTROSE 60000 UNIT/ML IV SOLN
3.0000 10*6.[IU] | INTRAVENOUS | Status: DC
  Administered 2021-09-04 – 2021-09-05 (×4): 3 10*6.[IU] via INTRAVENOUS
  Filled 2021-09-04 (×4): qty 50

## 2021-09-04 MED ORDER — ONDANSETRON HCL 4 MG/2ML IJ SOLN: 4.0000 mg | Freq: Four times a day (QID) | INTRAMUSCULAR | Status: DC | PRN

## 2021-09-04 MED ORDER — OXYTOCIN-SODIUM CHLORIDE 30-0.9 UT/500ML-% IV SOLN
INTRAVENOUS | Status: AC
  Administered 2021-09-04: 2 m[IU]/min via INTRAVENOUS
  Filled 2021-09-04: qty 500

## 2021-09-04 MED ORDER — OXYTOCIN-SODIUM CHLORIDE 30-0.9 UT/500ML-% IV SOLN
2.5000 [IU]/h | INTRAVENOUS | Status: DC
  Administered 2021-09-05: 2.5 [IU]/h via INTRAVENOUS
  Filled 2021-09-04: qty 500

## 2021-09-04 MED ORDER — LIDOCAINE HCL (PF) 1 % IJ SOLN: 30.0000 mL | INTRAMUSCULAR | Status: DC | PRN

## 2021-09-04 MED ORDER — BUTORPHANOL TARTRATE 1 MG/ML IJ SOLN
2.0000 mg | INTRAMUSCULAR | Status: DC | PRN
  Administered 2021-09-05: 2 mg via INTRAVENOUS
  Filled 2021-09-04: qty 2

## 2021-09-04 NOTE — H&P (Signed)
Joanne Pacheco is an 20 y.o. female.   Chief Complaint: Leakage of fluid HPI: She presented to labor and delivery today complaining of leakage of fluid. She reported a gush of clear fluid this AM. She reported normal fetal movement. She denies vaginal bleeding and fevers.  She has a history of prior cesarean section for fetal intolerance and desires a cesarean section.   [name] (from 08/07/21 to present)     Problem Noted Resolved   Supervision of other normal pregnancy, antepartum 01/23/2021 by Zipporah Plants, CNM No   Overview Addendum 09/02/2021 11:24 AM by Natale Milch, MD     Nursing Staff Provider  Office Location  [facility] Dating   LMP = 10 wk [place]  Language  English Anatomy US  Normal female  Flu Vaccine   declined Genetic Screen  NIPS: normal xx  TDaP vaccine   08/13/2021 Hgb A1C or  GTT Early : 65 Third trimester : nml  Rhogam   n/a   LAB RESULTS   Feeding Plan  breast Blood Type --/--/O POS (03/25 0855)   Contraception  Nexplanon Antibody NEG (03/25 0855)  Circumcision  n/a Rubella 1.83 (03/26 2027)  Pediatrician    RPR NON REACTIVE (03/26 2027)   Support Person  [name] HBsAg NON REACTIVE (03/25 1046)   Prenatal Classes   HIV NON REACTIVE (03/26 0532)    Varicella  immune  BTL Consent  n/a GBS  positive       VBAC Consent  desires Pap  n/a <21 yo    Hgb Electro   normal adult hgb    CF  negative   COVID-19  declined vax  had in 9/21 SMA  negative       History of preeclampsia History of cesarean, desires TOLAC History of infant loss at 1 month after delivery            Past Medical History:  Diagnosis Date   Hyperemesis 02/08/2020   Medical history non-contributory    Obesity     Past Surgical History:  Procedure Laterality Date   CESAREAN SECTION     07/18/2020   CHOLECYSTECTOMY     2021 (at [redacted] weeks gestation)    History reviewed. No pertinent family history. Social History:  reports that she quit smoking about 7 months ago. Her  smoking use included cigarettes. She has never used smokeless tobacco. She reports that she does not currently use alcohol. She reports that she does not use drugs.  Allergies: No Known Allergies  Medications Prior to Admission  Medication Sig Dispense Refill   Prenatal Vit-Fe Fumarate-FA (PRENATAL MULTIVITAMIN) TABS tablet Take 1 tablet by mouth daily at 12 noon.      Results for orders placed or performed during the hospital encounter of 09/04/21 (from the past 48 hour(s))  ROM Plus ([facility] only)     Status: None   Collection Time: 09/04/21 12:34 PM  Result Value Ref Range   Rom Plus POSITIVE     Comment: Performed at [facility], [address], [place], [place] [address]  Resp Panel by RT-PCR (Flu A&B, Covid) Nasopharyngeal Swab     Status: None   Collection Time: 09/04/21  1:35 PM   Specimen: Nasopharyngeal Swab; Nasopharyngeal(NP) swabs in vial transport medium  Result Value Ref Range   SARS Coronavirus 2 by RT PCR NEGATIVE NEGATIVE    Comment: (NOTE) SARS-CoV-2 target nucleic acids are NOT DETECTED.  The SARS-CoV-2 RNA is generally detectable in upper respiratory  specimens during the acute phase of infection. The lowest concentration of SARS-CoV-2 viral copies this assay can detect is 138 copies/mL. A negative result does not preclude SARS-Cov-2 infection and should not be used as the sole basis for treatment or other patient management decisions. A negative result may occur with  improper specimen collection/handling, submission of specimen other than nasopharyngeal swab, presence of viral mutation(s) within the areas targeted by this assay, and inadequate number of viral copies(<138 copies/mL). A negative result must be combined with clinical observations, patient history, and epidemiological information. The expected result is Negative.  Fact Sheet for Patients:  BloggerCourse.com  Fact Sheet for Healthcare Providers:   SeriousBroker.it  This test is no t yet approved or cleared by the [place] FDA and  has been authorized for detection and/or diagnosis of SARS-CoV-2 by FDA under an Emergency Use Authorization (EUA). This EUA will remain  in effect (meaning this test can be used) for the duration of the COVID-19 declaration under Section 564(b)(1) of the Act, 21 U.S.C.section 360bbb-3(b)(1), unless the authorization is terminated  or revoked sooner.       Influenza A by PCR NEGATIVE NEGATIVE   Influenza B by PCR NEGATIVE NEGATIVE    Comment: (NOTE) The Xpert Xpress SARS-CoV-2/FLU/RSV plus assay is intended as an aid in the diagnosis of influenza from Nasopharyngeal swab specimens and should not be used as a sole basis for treatment. Nasal washings and aspirates are unacceptable for Xpert Xpress SARS-CoV-2/FLU/RSV testing.  Fact Sheet for Patients: BloggerCourse.com  Fact Sheet for Healthcare Providers: SeriousBroker.it  This test is not yet approved or cleared by the [place] FDA and has been authorized for detection and/or diagnosis of SARS-CoV-2 by FDA under an Emergency Use Authorization (EUA). This EUA will remain in effect (meaning this test can be used) for the duration of the COVID-19 declaration under Section 564(b)(1) of the Act, 21 U.S.C. section 360bbb-3(b)(1), unless the authorization is terminated or revoked.  Performed at [facility], [address] [place], [place] [address]   CBC     Status: Abnormal   Collection Time: 09/04/21  2:44 PM  Result Value Ref Range   WBC 7.3 4.0 - 10.5 K/uL   RBC 3.79 (L) 3.87 - 5.11 MIL/uL   Hemoglobin 11.7 (L) 12.0 - 15.0 g/dL   HCT 82.9 (L) 93.7 - 16.9 %   MCV 88.4 80.0 - 100.0 fL   MCH 30.9 26.0 - 34.0 pg   MCHC 34.9 30.0 - 36.0 g/dL   RDW 67.8 93.8 - 10.1 %   Platelets 249 150 - 400 K/uL   nRBC 0.0 0.0 - 0.2 %    Comment: Performed at  [facility], [address] [place], [place] [address]  Type and screen     Status: None   Collection Time: 09/04/21  2:44 PM  Result Value Ref Range   ABO/RH(D) O POS    Antibody Screen NEG    Sample Expiration      09/07/2021,2359 Performed at [facility], [address] [place], [place] [address]    No results found.  Review of Systems  Constitutional:  Negative for chills and fever.  HENT:  Negative for congestion, hearing loss and sinus pain.   Respiratory:  Negative for cough, shortness of breath and wheezing.   Cardiovascular:  Negative for chest pain, palpitations and leg swelling.  Gastrointestinal:  Negative for abdominal pain, constipation, diarrhea, nausea and vomiting.  Genitourinary:  Negative for dysuria, flank pain,  frequency, hematuria and urgency.  Musculoskeletal:  Negative for back pain.  Skin:  Negative for rash.  Neurological:  Negative for dizziness and headaches.  Psychiatric/Behavioral:  Negative for suicidal ideas. The patient is not nervous/anxious.    Blood pressure 126/78, pulse 98, temperature 98.3 F (36.8 C), temperature source Oral, resp. rate 16, height 5\' 7"  (1.702 m), weight 113.9 kg, last menstrual period 12/03/2020, unknown if currently breastfeeding. Physical Exam Vitals and nursing note reviewed.  Constitutional:      Appearance: Normal appearance. She is well-developed.  HENT:     Head: Normocephalic and atraumatic.  Cardiovascular:     Rate and Rhythm: Normal rate and regular rhythm.  Pulmonary:     Effort: Pulmonary effort is normal.     Breath sounds: Normal breath sounds.  Abdominal:     General: Bowel sounds are normal.     Palpations: Abdomen is soft.  Musculoskeletal:        General: Normal range of motion.  Skin:    General: Skin is warm and dry.  Neurological:     Mental Status: She is alert and oriented to person, place, and time.  Psychiatric:        Behavior: Behavior normal.        Thought  Content: Thought content normal.        Judgment: Judgment normal.     Rom plus was positive.   Assessment/Plan  19 y.o. G2P1000 [redacted]w[redacted]d here for PROM. Will begin induction with pitocin.  Normal fetal monitoring per unit policy Reviewed option for pain management with patient. Patient does desire an epidural.  Normal admission labs GBS: positive, antibiotics ordered Patient has been consented for TOLAC and desires to proceed  [redacted]w[redacted]d MD, Adelene Idler OB/GYN, [facility] 09/04/2021 10:43 PM

## 2021-09-04 NOTE — OB Triage Note (Signed)
Pt is a G2P0 at [redacted]w[redacted]d that presents from ED with c/o having a gush of fluid after emptying her bladder this morning, and she continues to stay wet. Pt states occasional ctx. Pt states positive FM and denies VB. EFM applied and initial fht 140.

## 2021-09-04 NOTE — Progress Notes (Signed)
Foley bulb placed without issue. SVE: 1.5/70/-3  NST: 130 bpm baseline, moderate variability,  accelerations present, no decelerations. Tocometer : every 2-3 minutes  Adelene Idler MD, Merlinda Frederick OB/GYN, [facility] 09/04/2021 10:48 PM

## 2021-09-04 NOTE — Progress Notes (Signed)
CNM notified of patient arrival to unit and new orders given.

## 2021-09-05 ENCOUNTER — Inpatient Hospital Stay: Payer: Medicaid Other | Admitting: Anesthesiology

## 2021-09-05 ENCOUNTER — Encounter: Payer: Self-pay | Admitting: Obstetrics and Gynecology

## 2021-09-05 DIAGNOSIS — O34219 Maternal care for unspecified type scar from previous cesarean delivery: Secondary | ICD-10-CM

## 2021-09-05 DIAGNOSIS — O98813 Other maternal infectious and parasitic diseases complicating pregnancy, third trimester: Secondary | ICD-10-CM

## 2021-09-05 DIAGNOSIS — Z3A39 39 weeks gestation of pregnancy: Secondary | ICD-10-CM

## 2021-09-05 DIAGNOSIS — O4202 Full-term premature rupture of membranes, onset of labor within 24 hours of rupture: Secondary | ICD-10-CM

## 2021-09-05 DIAGNOSIS — B951 Streptococcus, group B, as the cause of diseases classified elsewhere: Secondary | ICD-10-CM

## 2021-09-05 LAB — RPR: RPR Ser Ql: NONREACTIVE

## 2021-09-05 MED ORDER — PHENYLEPHRINE 40 MCG/ML (10ML) SYRINGE FOR IV PUSH (FOR BLOOD PRESSURE SUPPORT): 80.0000 ug | PREFILLED_SYRINGE | INTRAVENOUS | Status: DC | PRN

## 2021-09-05 MED ORDER — PRENATAL MULTIVITAMIN CH
1.0000 | ORAL_TABLET | Freq: Every day | ORAL | Status: DC
  Administered 2021-09-05 – 2021-09-06 (×2): 1 via ORAL
  Filled 2021-09-05 (×2): qty 1

## 2021-09-05 MED ORDER — BENZOCAINE-MENTHOL 20-0.5 % EX AERO
1.0000 "application " | INHALATION_SPRAY | CUTANEOUS | Status: DC | PRN
  Administered 2021-09-05: 1 via TOPICAL
  Filled 2021-09-05: qty 56

## 2021-09-05 MED ORDER — DIPHENHYDRAMINE HCL 25 MG PO CAPS: 25.0000 mg | ORAL_CAPSULE | Freq: Four times a day (QID) | ORAL | Status: DC | PRN

## 2021-09-05 MED ORDER — EPHEDRINE 5 MG/ML INJ: 10.0000 mg | INTRAVENOUS | Status: DC | PRN

## 2021-09-05 MED ORDER — TETANUS-DIPHTH-ACELL PERTUSSIS 5-2.5-18.5 LF-MCG/0.5 IM SUSY
0.5000 mL | PREFILLED_SYRINGE | Freq: Once | INTRAMUSCULAR | Status: DC
  Filled 2021-09-05: qty 0.5

## 2021-09-05 MED ORDER — DIBUCAINE (PERIANAL) 1 % EX OINT
1.0000 "application " | TOPICAL_OINTMENT | CUTANEOUS | Status: DC | PRN
  Administered 2021-09-05: 1 via RECTAL
  Filled 2021-09-05: qty 28

## 2021-09-05 MED ORDER — COCONUT OIL OIL
1.0000 "application " | TOPICAL_OIL | Status: DC | PRN
  Filled 2021-09-05: qty 120

## 2021-09-05 MED ORDER — PHENYLEPHRINE 40 MCG/ML (10ML) SYRINGE FOR IV PUSH (FOR BLOOD PRESSURE SUPPORT)
80.0000 ug | PREFILLED_SYRINGE | INTRAVENOUS | Status: DC | PRN
Start: 2021-09-05 — End: 2021-09-05

## 2021-09-05 MED ORDER — BUPIVACAINE HCL (PF) 0.25 % IJ SOLN
INTRAMUSCULAR | Status: DC | PRN
  Administered 2021-09-05 (×2): 5 mL via EPIDURAL

## 2021-09-05 MED ORDER — FENTANYL-BUPIVACAINE-NACL 0.5-0.125-0.9 MG/250ML-% EP SOLN
EPIDURAL | Status: AC
  Filled 2021-09-05: qty 250

## 2021-09-05 MED ORDER — LIDOCAINE HCL (PF) 1 % IJ SOLN
INTRAMUSCULAR | Status: DC | PRN
  Administered 2021-09-05: 3 mL via SUBCUTANEOUS

## 2021-09-05 MED ORDER — FENTANYL-BUPIVACAINE-NACL 0.5-0.125-0.9 MG/250ML-% EP SOLN: 12.0000 mL/h | EPIDURAL | Status: DC | PRN

## 2021-09-05 MED ORDER — OXYCODONE HCL 5 MG PO TABS: 5.0000 mg | ORAL_TABLET | Freq: Four times a day (QID) | ORAL | Status: DC | PRN

## 2021-09-05 MED ORDER — FENTANYL-BUPIVACAINE-NACL 0.5-0.125-0.9 MG/250ML-% EP SOLN
EPIDURAL | Status: DC | PRN
  Administered 2021-09-05: 12 mL/h via EPIDURAL

## 2021-09-05 MED ORDER — IBUPROFEN 600 MG PO TABS
600.0000 mg | ORAL_TABLET | Freq: Four times a day (QID) | ORAL | Status: DC
  Administered 2021-09-05 – 2021-09-06 (×5): 600 mg via ORAL
  Filled 2021-09-05 (×5): qty 1

## 2021-09-05 MED ORDER — LACTATED RINGERS IV SOLN
500.0000 mL | Freq: Once | INTRAVENOUS | Status: AC
  Administered 2021-09-05: 500 mL via INTRAVENOUS

## 2021-09-05 MED ORDER — WITCH HAZEL-GLYCERIN EX PADS
1.0000 "application " | MEDICATED_PAD | CUTANEOUS | Status: DC | PRN
  Administered 2021-09-05: 1 via TOPICAL
  Filled 2021-09-05: qty 100

## 2021-09-05 MED ORDER — SIMETHICONE 80 MG PO CHEW
80.0000 mg | CHEWABLE_TABLET | ORAL | Status: DC | PRN
  Administered 2021-09-05: 80 mg via ORAL
  Filled 2021-09-05: qty 1

## 2021-09-05 MED ORDER — SENNOSIDES-DOCUSATE SODIUM 8.6-50 MG PO TABS
2.0000 | ORAL_TABLET | Freq: Every day | ORAL | Status: DC
  Administered 2021-09-06: 2 via ORAL
  Filled 2021-09-05: qty 2

## 2021-09-05 MED ORDER — ONDANSETRON HCL 4 MG/2ML IJ SOLN: 4.0000 mg | INTRAMUSCULAR | Status: DC | PRN

## 2021-09-05 MED ORDER — MISOPROSTOL 200 MCG PO TABS
800.0000 ug | ORAL_TABLET | Freq: Once | ORAL | Status: AC
  Administered 2021-09-05: 800 ug via RECTAL

## 2021-09-05 MED ORDER — LIDOCAINE-EPINEPHRINE (PF) 1.5 %-1:200000 IJ SOLN
INTRAMUSCULAR | Status: DC | PRN
  Administered 2021-09-05: 3 mL via EPIDURAL

## 2021-09-05 MED ORDER — DIPHENHYDRAMINE HCL 50 MG/ML IJ SOLN
12.5000 mg | INTRAMUSCULAR | Status: DC | PRN
Start: 2021-09-05 — End: 2021-09-05

## 2021-09-05 MED ORDER — ACETAMINOPHEN 325 MG PO TABS
650.0000 mg | ORAL_TABLET | ORAL | Status: DC | PRN
  Administered 2021-09-05: 650 mg via ORAL
  Filled 2021-09-05: qty 2

## 2021-09-05 MED ORDER — ONDANSETRON HCL 4 MG PO TABS
4.0000 mg | ORAL_TABLET | ORAL | Status: DC | PRN
  Filled 2021-09-05: qty 1

## 2021-09-05 NOTE — Discharge Summary (Signed)
OB Discharge Summary     Patient Name: Joanne Pacheco DOB: June 05, 2001 MRN: [id number]  Date of admission: 09/04/2021 Delivering Provider: Tresea Mall, CNM  Date of Delivery: 09/05/2021  Date of discharge: 09/06/2021  Admitting diagnosis: Vaginal discharge in pregnancy in third trimester [O26.893, N89.8] History of cesarean section [Z98.891] Intrauterine pregnancy: [redacted]w[redacted]d     Secondary diagnosis: TOLAC     Discharge diagnosis: VBAC                                                                                                Post partum procedures: none  Augmentation: AROM, Pitocin, and IP Foley  Complications: None  Hospital course:  Onset of Labor With Vaginal Delivery      20 y.o. yo G2P2001 at [redacted]w[redacted]d was admitted in Latent Labor on 09/04/2021. Patient had an uncomplicated labor course as follows:  Membrane Rupture Time/Date: 9:00 AM ,09/04/2021   Delivery Method:VBAC, Spontaneous  Episiotomy: None  Lacerations:  Vaginal  See delivery note for details  Patient had an uncomplicated postpartum course.  She is tolerating regular diet, her pain is controlled with PO medications. She is ambulating and voiding without difficulty.    Patient is discharged home in stable condition on 09/06/21.  Newborn Data: Birth date:09/05/2021  Birth time:10:17 AM  Gender:Female  Green 'li Living status:Living  Apgars:9 ,9  Weight:2960 g  6 pounds 8 ounces  Physical exam  Vitals:   09/05/21 2003 09/05/21 2327 09/06/21 0337 09/06/21 0848  BP: 124/81 (!) 109/56 100/80 114/77  Pulse: 88 79 81 73  Resp: 18 18 18 18   Temp:  98 F (36.7 C) 98.1 F (36.7 C) 98.4 F (36.9 C)  TempSrc:  Oral Oral Oral  SpO2: 98% 99% 97% 100%  Weight:      Height:       General: alert, cooperative, and no distress Lochia: appropriate Uterine Fundus: firm Incision: N/A DVT Evaluation: No evidence of DVT seen on physical exam. No cords or calf tenderness. No significant calf/ankle  edema.  Labs: Lab Results  Component Value Date   WBC 16.8 (H) 09/06/2021   HGB 9.5 (L) 09/06/2021   HCT 28.6 (L) 09/06/2021   MCV 89.4 09/06/2021   PLT 234 09/06/2021    Discharge instruction: per After Visit Summary.  Medications:  Allergies as of 09/06/2021   No Known Allergies      Medication List     TAKE these medications    acetaminophen 500 MG tablet Commonly known as: TYLENOL Take 2 tablets (1,000 mg total) by mouth every 8 (eight) hours as needed for moderate pain or mild pain.   ibuprofen 600 MG tablet Commonly known as: ADVIL Take 1 tablet (600 mg total) by mouth every 6 (six) hours as needed for mild pain or moderate pain.   prenatal multivitamin Tabs tablet Take 1 tablet by mouth daily at 12 noon.               Discharge Care Instructions  (From admission, onward)           Start     Ordered   09/06/21  0000  Discharge wound care:       Comments: Perform wound care instructions   09/06/21 1304            Diet: routine diet  Activity: Advance as tolerated. Pelvic rest for 6 weeks.   Outpatient follow up:  Follow-up Information     Tresea Mall, CNM. Go in 2 week(s).   Specialty: Obstetrics Why: postpartum visit and schedule visit for 6 weeks postpartum visit/Nexplanon insertion Contact information: [address] [place] [address] [phone]                   Postpartum contraception:  Nexplanon Rhogam Given postpartum: Rh positive Rubella vaccine given postpartum: immune Varicella vaccine given postpartum: immune TDaP given antepartum or postpartum: given antepartum    Newborn Delivery   Birth date/time: 09/05/2021 10:17:00 Delivery type: VBAC, Spontaneous       Baby Feeding: Breast in the hospital/plans to feed formula at home  Disposition:home with mother  SIGNED:  Thomasene Mohair, MD 09/06/2021 1:04 PM

## 2021-09-05 NOTE — Lactation Note (Signed)
This note was copied from a baby's chart. Lactation Consultation Note  Patient Name: Joanne Pacheco LOVFI'E Date: 09/05/2021 Reason for consult: L&D Initial assessment;1st time breastfeeding;Term Age:21 hours  Maternal Data Does the patient have breastfeeding experience prior to this delivery?: No  Feeding Mother's Current Feeding Choice: Breast Milk and Formula Called to assist with first feeding in LDR 3, mom states nursing hurts, states she wants to formula feed, mom shown how to support breast and get deeper latch, still states that it hurts, advised mom to continue nursing for present and think about her decision of whether to breastfeed or formula feed and I would assist her with whatever decision she makes     LATCH Score Latch: Grasps breast easily, tongue down, lips flanged, rhythmical sucking.  Audible Swallowing: A few with stimulation  Type of Nipple: Everted at rest and after stimulation  Comfort (Breast/Nipple): Filling, red/small blisters or bruises, mild/mod discomfort  Hold (Positioning): Assistance needed to correctly position infant at breast and maintain latch.  LATCH Score: 7   Lactation Tools Discussed/Used    Interventions Interventions: Breast feeding basics reviewed;Assisted with latch;Skin to skin;Breast compression;Adjust position;Education  Discharge    Consult Status Consult Status: Follow-up from L&D Date: 09/05/21 Follow-up type: In-patient    Dyann Kief 09/05/2021, 11:51 AM

## 2021-09-05 NOTE — Progress Notes (Signed)
  Labor Progress Note   19 y.o. G2P1000 @ [redacted]w[redacted]d , admitted for  Pregnancy, Labor Management. TOLAC  Subjective:  Comfortable with epidural  Objective:  BP (!) 141/85 (BP Location: Right Arm)   Pulse 92   Temp 97.9 F (36.6 C) (Oral)   Resp 16   Ht 5\' 7"  (1.702 m)   Wt 113.9 kg   LMP 12/03/2020 (Exact Date)   SpO2 99%   BMI 39.31 kg/m  Abd: gravid, ND, FHT present, mild tenderness on exam Extr: trace to 1+ bilateral pedal edema SVE: CERVIX: 7.5 cm dilated, 90 effaced, -1 station  EFM: FHR: 130 bpm, variability: moderate,  accelerations:  Present,  decelerations:  Absent Toco: Frequency: Every 2-4 minutes Labs: I have reviewed the patient's lab results.   Assessment & Plan:  G2P1000 @ [redacted]w[redacted]d, admitted for  Pregnancy and Labor/Delivery Management  1. Pain management: epidural. 2. FWB: FHT category I.  3. ID: GBS positive s/p loading dose 3 additional doses penicillin 4. Labor management: continue pitocin titration  All discussed with patient, see orders   [redacted]w[redacted]d, CNM [facility] 09/05/2021  8:21 AM

## 2021-09-05 NOTE — Progress Notes (Signed)
IUPC placed without issue 4/90/-2 Patient comfortable with epidural.  Category 1 tracing.  Continue with pitocin for IOL  Adelene Idler MD, Merlinda Frederick OB/GYN, [facility] 09/05/2021 4:34 AM

## 2021-09-05 NOTE — Anesthesia Preprocedure Evaluation (Signed)
Anesthesia Evaluation  Patient identified by MRN, date of birth, ID band Patient awake    Reviewed: Allergy & Precautions, NPO status , Patient's Chart, lab work & pertinent test results  Airway Mallampati: III  TM Distance: >3 FB Neck ROM: full    Dental no notable dental hx.    Pulmonary neg pulmonary ROS, former smoker,    Pulmonary exam normal        Cardiovascular Exercise Tolerance: Good negative cardio ROS Normal cardiovascular exam     Neuro/Psych    GI/Hepatic negative GI ROS,   Endo/Other    Renal/GU   negative genitourinary   Musculoskeletal   Abdominal (+) + obese,   Peds  Hematology negative hematology ROS (+)   Anesthesia Other Findings TOLAC  Past Medical History: 02/08/2020: Hyperemesis No date: Medical history non-contributory No date: Obesity  Past Surgical History: No date: CESAREAN SECTION     Comment:  07/18/2020 No date: CHOLECYSTECTOMY     Comment:  2021 (at [redacted] weeks gestation)  BMI    Body Mass Index: 39.31 kg/m      Reproductive/Obstetrics (+) Pregnancy                             Anesthesia Physical Anesthesia Plan  ASA: 2  Anesthesia Plan: Epidural   Post-op Pain Management:    Induction:   PONV Risk Score and Plan:   Airway Management Planned:   Additional Equipment:   Intra-op Plan:   Post-operative Plan:   Informed Consent: I have reviewed the patients History and Physical, chart, labs and discussed the procedure including the risks, benefits and alternatives for the proposed anesthesia with the patient or authorized representative who has indicated his/her understanding and acceptance.       Plan Discussed with: Anesthesiologist  Anesthesia Plan Comments:         Anesthesia Quick Evaluation

## 2021-09-05 NOTE — Anesthesia Procedure Notes (Signed)
Epidural Patient location during procedure: OB Start time: 09/05/2021 3:45 AM End time: 09/05/2021 4:08 AM  Staffing Anesthesiologist: Foye Deer, MD Performed: anesthesiologist   Preanesthetic Checklist Completed: patient identified, IV checked, site marked, risks and benefits discussed, surgical consent, monitors and equipment checked, pre-op evaluation and timeout performed  Epidural Patient position: sitting Prep: ChloraPrep Patient monitoring: heart rate, continuous pulse ox and blood pressure Approach: midline Location: L3-L4 Injection technique: LOR saline  Needle:  Needle type: Tuohy  Needle gauge: 18 G Needle length: 9 cm Needle insertion depth: 6.5 cm Catheter type: closed end Catheter size: 20 Guage Catheter at skin depth: 11 cm Test dose: negative and 1.5% lidocaine with Epi 1:200 K  Assessment Events: blood not aspirated  Additional Notes Reason for block:procedure for pain

## 2021-09-06 LAB — CBC
HCT: 28.6 % — ABNORMAL LOW (ref 36.0–46.0)
Hemoglobin: 9.5 g/dL — ABNORMAL LOW (ref 12.0–15.0)
MCH: 29.7 pg (ref 26.0–34.0)
MCHC: 33.2 g/dL (ref 30.0–36.0)
MCV: 89.4 fL (ref 80.0–100.0)
Platelets: 234 10*3/uL (ref 150–400)
RBC: 3.2 MIL/uL — ABNORMAL LOW (ref 3.87–5.11)
RDW: 14.5 % (ref 11.5–15.5)
WBC: 16.8 10*3/uL — ABNORMAL HIGH (ref 4.0–10.5)
nRBC: 0 % (ref 0.0–0.2)

## 2021-09-06 MED ORDER — IBUPROFEN 600 MG PO TABS: 600.0000 mg | ORAL_TABLET | Freq: Four times a day (QID) | ORAL | 0 refills | Status: DC | PRN

## 2021-09-06 MED ORDER — ACETAMINOPHEN 500 MG PO TABS: 1000.0000 mg | ORAL_TABLET | Freq: Three times a day (TID) | ORAL | Status: DC | PRN

## 2021-09-06 NOTE — Anesthesia Postprocedure Evaluation (Signed)
Anesthesia Post Note  Patient: Joanne Pacheco  Procedure(s) Performed: AN AD HOC LABOR EPIDURAL  Patient location during evaluation: Mother Baby Anesthesia Type: Epidural Level of consciousness: awake and alert Pain management: pain level controlled Vital Signs Assessment: post-procedure vital signs reviewed and stable Respiratory status: spontaneous breathing, nonlabored ventilation and respiratory function stable Cardiovascular status: blood pressure returned to baseline and stable Postop Assessment: no apparent nausea or vomiting, no headache, able to ambulate and patient able to bend at knees Anesthetic complications: no   No notable events documented.   Last Vitals:  Vitals:   09/06/21 0337 09/06/21 0848  BP: 100/80 114/77  Pulse: 81 73  Resp: 18 18  Temp: 36.7 C 36.9 C  SpO2: 97% 100%    Last Pain:  Vitals:   09/06/21 0848  TempSrc: Oral  PainSc:                  Foye Deer

## 2021-09-06 NOTE — Progress Notes (Signed)
Pt discharged with infant. Discharge instructions, prescriptions, and follow up appointments given to and reviewed with patient. Pt verbalized understanding. To be escorted out by auxillary.  °

## 2021-09-08 ENCOUNTER — Encounter: Payer: Medicaid Other | Admitting: Obstetrics & Gynecology

## 2021-10-20 ENCOUNTER — Other Ambulatory Visit: Payer: Self-pay

## 2021-10-20 ENCOUNTER — Ambulatory Visit (INDEPENDENT_AMBULATORY_CARE_PROVIDER_SITE_OTHER): Payer: Medicaid Other | Admitting: Advanced Practice Midwife

## 2021-10-20 ENCOUNTER — Encounter: Payer: Self-pay | Admitting: Advanced Practice Midwife

## 2021-10-20 ENCOUNTER — Other Ambulatory Visit (HOSPITAL_COMMUNITY)
Admission: RE | Admit: 2021-10-20 | Discharge: 2021-10-20 | Disposition: A | Discharge status: Home / Self Care | Payer: Medicaid Other | Source: Ambulatory Visit | Attending: Advanced Practice Midwife | Admitting: Advanced Practice Midwife

## 2021-10-20 DIAGNOSIS — Z30017 Encounter for initial prescription of implantable subdermal contraceptive: Secondary | ICD-10-CM

## 2021-10-20 DIAGNOSIS — Z124 Encounter for screening for malignant neoplasm of cervix: Secondary | ICD-10-CM | POA: Diagnosis present

## 2021-10-20 NOTE — Progress Notes (Signed)
Postpartum Visit  Chief Complaint:  Chief Complaint  Patient presents with   Postpartum Care    History of Present Illness: Patient is a 20 y.o. G2P2001 presents for postpartum visit.  Review the Delivery Report for details.   Date of delivery: 09/05/2021 Type of delivery: Vaginal delivery - Vacuum or forceps assisted  no Episiotomy No.  Laceration: superficial right upper vaginal  Pregnancy or labor problems:  no Any problems since the delivery:  no  Newborn Details:  SINGLETON :  1. BabyGender female. Birth weight: 6 pounds 8 ounces Maternal Details:  Breast or formula feeding: she initiated breastfeeding and is now formula feeding Intercourse: No  Contraception after delivery: Yes  Nexplanon today Any bowel or bladder issues: No  Post partum depression/anxiety noted:  no Edinburgh Post-Partum Depression Score: 6 Date of last PAP: First PAP today  Review of Systems: Review of Systems  Constitutional:  Negative for chills and fever.  HENT:  Negative for congestion, ear discharge, ear pain, hearing loss, sinus pain and sore throat.   Eyes:  Negative for blurred vision and double vision.  Respiratory:  Negative for cough, shortness of breath and wheezing.   Cardiovascular:  Negative for chest pain, palpitations and leg swelling.  Gastrointestinal:  Negative for abdominal pain, blood in stool, constipation, diarrhea, heartburn, melena, nausea and vomiting.  Genitourinary:  Negative for dysuria, flank pain, frequency, hematuria and urgency.  Musculoskeletal:  Negative for back pain, joint pain and myalgias.  Skin:  Negative for itching and rash.  Neurological:  Negative for dizziness, tingling, tremors, sensory change, speech change, focal weakness, seizures, loss of consciousness, weakness and headaches.  Endo/Heme/Allergies:  Negative for environmental allergies. Does not bruise/bleed easily.  Psychiatric/Behavioral:  Negative for depression, hallucinations, memory  loss, substance abuse and suicidal ideas. The patient is not nervous/anxious and does not have insomnia.     Past Medical History:  Past Medical History:  Diagnosis Date   Hyperemesis 02/08/2020   Medical history non-contributory    Obesity     Past Surgical History:  Past Surgical History:  Procedure Laterality Date   CESAREAN SECTION     07/18/2020   CHOLECYSTECTOMY     2021 (at [redacted] weeks gestation)    Family History:  History reviewed. No pertinent family history.  Social History:  Social History   Socioeconomic History   Marital status: Single    Spouse name: Crista Curb   Number of children: Not on file   Years of education: Not on file   Highest education level: Not on file  Occupational History   Not on file  Tobacco Use   Smoking status: Former    Types: Cigarettes    Quit date: 01/14/2021    Years since quitting: 0.7   Smokeless tobacco: Never  Vaping Use   Vaping Use: Never used  Substance and Sexual Activity   Alcohol use: Not Currently    Comment: last use 01/02/21- one glass wine   Drug use: Never   Sexual activity: Yes    Birth control/protection: Implant  Other Topics Concern   Not on file  Social History Narrative   Not on file   Social Determinants of Health   Financial Resource Strain: Not on file  Food Insecurity: Not on file  Transportation Needs: Not on file  Physical Activity: Not on file  Stress: Not on file  Social Connections: Not on file  Intimate Partner Violence: Not At Risk   Fear of Current or  Ex-Partner: No   Emotionally Abused: No   Physically Abused: No   Sexually Abused: No    Allergies:  No Known Allergies  Medications: Prior to Admission medications   Medication Sig Start Date End Date Taking? Authorizing Provider  etonogestrel (NEXPLANON) 68 MG IMPL implant 1 each by Subdermal route once. 10/20/21 10/20/24 Yes Rod Can, CNM    Physical Exam Blood pressure 120/80, height 5\' 7"  (1.702 m), weight 251 lb  (113.9 kg), not currently breastfeeding.    General: NAD HEENT: normocephalic, anicteric Pulmonary: No increased work of breathing Abdomen: NABS, soft, non-tender, non-distended.  Umbilicus without lesions.  No hepatomegaly, splenomegaly or masses palpable. No evidence of hernia. Genitourinary: cervical visualization limited by body habitus  External: Normal external female genitalia.  Normal urethral meatus, normal  Bartholin's and Skene's glands.    Vagina: Normal vaginal mucosa, no evidence of prolapse, scant blood in vault  Cervix: Able to see edge of cervix only, no CMT  Uterus: Non-enlarged, mobile, normal contour.    Adnexa: ovaries non-enlarged, no adnexal masses  Rectal: deferred Extremities: no edema, erythema, or tenderness Neurologic: Grossly intact Psychiatric: mood appropriate, affect full   Depression screen [facility] 2/9 10/20/2021 01/22/2021  Decreased Interest 0 1  Down, Depressed, Hopeless 1 1  PHQ - 2 Score 1 2  Altered sleeping 1 1  Tired, decreased energy 2 1  Change in appetite 1 1  Feeling bad or failure about yourself  1 2  Trouble concentrating 0 2  Moving slowly or fidgety/restless 0 1  Suicidal thoughts 0 0  PHQ-9 Score 6 10  Difficult doing work/chores - Somewhat difficult     Assessment: 20 y.o. G2P2001 presenting for 6 week postpartum visit  Plan: Problem List Items Addressed This Visit   None Visit Diagnoses     6 weeks postpartum follow-up    -  Primary   Relevant Orders   Cytology - PAP   Nexplanon insertion       Screening for cervical cancer       Relevant Orders   Cytology - PAP        1) Contraception - Education given regarding options for contraception, as well as compatibility with breast feeding if applicable.  Patient plans on Nexplanon for contraception.  2)  Pap - ASCCP guidelines and rationale discussed.  Patient opts for every 3 years screening interval  3) Patient underwent screening for postpartum depression with no  signs of depression  4) Return in about 1 year (around 10/20/2022) for annual established gyn.   Rod Can, CNM [facility] 10/20/2021, 2:46 PM      GYNECOLOGY PROCEDURE NOTE  Patient is a 20 y.o. G2P2001 presenting for Nexplanon insertion as her desired means of contraception.  She provided informed consent, signed copy in the chart, time out was performed. Recent pregnancy.  She understands that Nexplanon is a progesterone only therapy, and that patients often have irregular and unpredictable vaginal bleeding or amenorrhea. She understands that other side effects are possible related to systemic progesterone, including but not limited to, headaches, breast tenderness, nausea, and irritability. While effective at preventing pregnancy long acting reversible contraceptives do not prevent transmission of sexually transmitted diseases and use of barrier methods for this purpose was discussed. The placement procedure for Nexplanon was reviewed with the patient in detail including risks of nerve injury, infection, bleeding and injury to other muscles or tendons. She understands that the Nexplanon implant is good for 3 years and  needs to be removed at the end of that time.  She understands that Nexplanon is an extremely effective option for contraception, with failure rate of <1%. This information is reviewed today and all questions were answered. Informed consent was obtained, both verbally and written.   The patient is healthy and has no contraindications to Nexplanon use.   Procedure Appropriate time out taken.  Patient placed in dorsal supine with left arm above head, elbow flexed at 90 degrees, arm resting on examination table.  The bicipital groove was palpated and site 8-10cm proximal to the medial epicondyle was indentified . The insertion site was prepped with a two betadine swabs and then injected with 2.5 ml of 2% lidocaine without epinephrine.  Nexplanon  removed form sterile blister packaging,  Device confirmed in needle, before inserting full length of needle, tenting up the skin as the needle was advanced.  The drug eluding rod was then deployed by pulling back the slider per the manufactures recommendation.  The implant was palpable by the clinician as well as the patient.  The insertion site covered dressed with a steri strip and band aid before applying  a kerlex bandage pressure dressing..Minimal blood loss was noted during the procedure.  The patient tolerated the procedure well.   She was instructed to wear the bandage for 24 hours, call with any signs of infection.  She was given the Implanon card and instructed to have the rod removed in 3 years.  Charge [id number] for nexplanon device, CPT [id number] for procedure J2001 for lidocaine administration Modifer 25, plus Modifer 79 is done during a global billing visit     Christean Leaf, [facility] 10/20/21, 2:57 PM

## 2021-10-22 LAB — CYTOLOGY - PAP: Diagnosis: NEGATIVE

## 2022-02-18 ENCOUNTER — Ambulatory Visit: Payer: Medicaid Other | Admitting: Advanced Practice Midwife

## 2022-03-09 ENCOUNTER — Ambulatory Visit: Payer: Medicaid Other | Admitting: Advanced Practice Midwife

## 2022-03-16 ENCOUNTER — Ambulatory Visit: Payer: Medicaid Other | Admitting: Advanced Practice Midwife

## 2022-03-20 ENCOUNTER — Ambulatory Visit: Payer: Medicaid Other | Admitting: Advanced Practice Midwife

## 2023-01-28 ENCOUNTER — Encounter: Payer: Self-pay | Admitting: Advanced Practice Midwife

## 2023-01-28 ENCOUNTER — Ambulatory Visit (INDEPENDENT_AMBULATORY_CARE_PROVIDER_SITE_OTHER): Payer: Medicaid Other | Admitting: Advanced Practice Midwife

## 2023-01-28 VITALS — BP 128/79 | HR 79 | Ht 67.0 in | Wt 320.0 lb

## 2023-01-28 DIAGNOSIS — Z3046 Encounter for surveillance of implantable subdermal contraceptive: Secondary | ICD-10-CM

## 2023-01-28 NOTE — Progress Notes (Signed)
[facility]  GYNECOLOGY PROCEDURE NOTE  Nexplanon removal discussed in detail.  Risks of infection, bleeding, nerve injury all reviewed.  Patient understands risks and desires to proceed.  Verbal consent obtained.  Patient is certain she wants the Nexplanon removed.  All questions answered. She wants to have a hormone free interval. She has had irregular and frequent bleeding, mood changes and weight gain.   Review of Systems  Constitutional:  Negative for chills and fever.  HENT:  Negative for congestion, ear discharge, ear pain, hearing loss, sinus pain and sore throat.   Eyes:  Negative for blurred vision and double vision.  Respiratory:  Negative for cough, shortness of breath and wheezing.   Cardiovascular:  Negative for chest pain, palpitations and leg swelling.  Gastrointestinal:  Negative for abdominal pain, blood in stool, constipation, diarrhea, heartburn, melena, nausea and vomiting.  Genitourinary:  Negative for dysuria, flank pain, frequency, hematuria and urgency.  Musculoskeletal:  Negative for back pain, joint pain and myalgias.  Skin:  Negative for itching and rash.  Neurological:  Negative for dizziness, tingling, tremors, sensory change, speech change, focal weakness, seizures, loss of consciousness, weakness and headaches.  Endo/Heme/Allergies:  Negative for environmental allergies. Does not bruise/bleed easily.       Positive for weight gain and irregular frequent menstrual bleeding  Psychiatric/Behavioral:  Negative for depression, hallucinations, memory loss, substance abuse and suicidal ideas. The patient is not nervous/anxious and does not have insomnia.        Positive for mood changes   Vital Signs: BP 128/79   Pulse 79   Ht '5\' 7"'$  (1.702 m)   Wt (!) 320 lb (145.2 kg)   LMP  (LMP Unknown) Comment: nexplanon  Breastfeeding No   BMI 50.12 kg/m  Constitutional: Well nourished, well developed female in no acute distress.  HEENT: normal Skin: Warm and dry.   Cardiovascular: Regular rate and rhythm.   Respiratory: Clear to auscultation bilateral. Normal respiratory effort Psych: Alert and Oriented x3. No memory deficits. Normal mood and affect.    Procedure: Patient placed in dorsal supine with left arm above head, elbow flexed at 90 degrees, arm resting on examination table.  Nexplanon identified without problems.  Betadine scrub x3.  2 ml of 1% lidocaine injected under Nexplanon device without problems.  Sterile gloves applied.  Small 0.5cm incision made at distal tip of Nexplanon device with 11 blade scalpel.  Nexplanon brought to incision and grasped with a small kelly clamp.  Nexplanon removed intact without problems.  Pressure applied to incision.  Hemostasis obtained.  Steri-strips applied, followed by bandage and compression dressing.  Patient tolerated procedure well.  No complications.   Assessment: 22 y.o. year old female now s/p uncomplicated Nexplanon removal.  Plan: 1.  Patient given post procedure precautions and asked to call for fever, chills, redness or drainage from her incision, bleeding from incision.  She understands she will likely have a small bruise near site of removal and can remove bandage tomorrow and steri-strips in approximately 1 week.  2) Contraception: abstinence  3) Return to clinic as needed   Christean Leaf, [facility] 01/28/23, 3:04 PM   J2001 for lidocaine block, 11982 for nexplanon removal

## 2023-02-01 ENCOUNTER — Encounter: Payer: Self-pay | Admitting: Advanced Practice Midwife

## 2023-08-05 ENCOUNTER — Ambulatory Visit: Payer: Medicaid Other

## 2023-11-25 ENCOUNTER — Ambulatory Visit: Payer: Medicaid Other | Admitting: Family Medicine

## 2023-11-25 VITALS — BP 125/86 | HR 70 | Ht 67.0 in | Wt 320.4 lb

## 2023-11-25 DIAGNOSIS — Z0000 Encounter for general adult medical examination without abnormal findings: Secondary | ICD-10-CM | POA: Diagnosis not present

## 2023-11-25 DIAGNOSIS — Z3202 Encounter for pregnancy test, result negative: Secondary | ICD-10-CM

## 2023-11-25 DIAGNOSIS — Z113 Encounter for screening for infections with a predominantly sexual mode of transmission: Secondary | ICD-10-CM

## 2023-11-25 DIAGNOSIS — Z309 Encounter for contraceptive management, unspecified: Secondary | ICD-10-CM | POA: Diagnosis not present

## 2023-11-25 DIAGNOSIS — Z3009 Encounter for other general counseling and advice on contraception: Secondary | ICD-10-CM

## 2023-11-25 LAB — WET PREP FOR TRICH, YEAST, CLUE
Trichomonas Exam: NEGATIVE
Yeast Exam: NEGATIVE

## 2023-11-25 LAB — PREGNANCY, URINE: Preg Test, Ur: NEGATIVE

## 2023-11-25 LAB — HM HIV SCREENING LAB: HM HIV Screening: NEGATIVE

## 2023-11-25 MED ORDER — NORETHINDRONE 0.35 MG PO TABS: 1.0000 | ORAL_TABLET | Freq: Every day | ORAL | 12 refills | Status: AC

## 2023-11-25 MED ORDER — ULIPRISTAL ACETATE 30 MG PO TABS: 1.0000 | ORAL_TABLET | Freq: Once | ORAL | 0 refills | Status: AC

## 2023-11-25 NOTE — Progress Notes (Signed)
 [facility] [address], [address] [place] [address] Main phone: [phone]  Family Planning Visit - Initial Visit  Subjective:  Deztinee Lohmeyer is a 23 y.o.  G2P2001   being seen today for an initial annual visit and to discuss reproductive life planning.  The patient is currently using No Method - No Contraceptive Precautions for pregnancy prevention. Patient reports   does not want a pregnancy in the next year.    Patient reports they are looking for a method that provides Cycle control  Patient has the following medical conditions has Obesity and History of cesarean section on their problem list.  Chief Complaint  Patient presents with   Annual Exam    PE/BC pills/Preg test    Patient reports to clinic for PE and to start OCPs. States she has gained 130# in the last year after being on Nexplanon . Reports hair loss- denies fatigue or difficulty sleeping. Reports poor mood on Nexplanon . LMP was about 3 months ago- pt reports irregular periods on Nexplanon . PT negative today. Has had multiple encounters of unprotected sex in the last 2 weeks. Reviewed that this limits her birth control options for today.  Diabetes screening This patient is 23 y.o. with a BMI of Body mass index is 50.18 kg/m..  Is this patient eligible for diabetes screening based on BMI> 31 and age >35?  no Was Hgb A1c ordered? not applicable  STI screening Patient reports 1 of partners in last year.  Does this patient desire STI screening?  Yes  Hepatitis C screening Has patient been screened once for HCV in the past?  Yes   Lab Results  Component Value Date   HCVAB NON REACTIVE 02/08/2020    Does the patient meet criteria for HCV testing? No If yes-- Screen for HCV through [facility] Criteria:  Since the last HCV result, does the patient have any of the following? - Current drug use - Have a partner with drug use - Has been  incarcerated  Hepatitis B screening Does the patient meet criteria for HBV testing? No Criteria:  -Household, sexual or needle sharing contact with HBV -History of drug use -HIV positive -Those with known Hep C  Health Maintenance Due  Topic Date Due   HPV VACCINES (1 - 3-dose series) Never done   CHLAMYDIA SCREENING  08/13/2022   INFLUENZA VACCINE  Never done   COVID-19 Vaccine (1 - 2024-25 season) Never done    Review of Systems  Constitutional:  Negative for weight loss.  Eyes:  Negative for blurred vision.  Respiratory:  Negative for cough and shortness of breath.   Cardiovascular:  Negative for claudication.  Gastrointestinal:  Negative for nausea.  Genitourinary:  Negative for dysuria and frequency.  Skin:  Negative for rash.  Neurological:  Negative for headaches.  Endo/Heme/Allergies:  Does not bruise/bleed easily.    The following portions of the patient's history were reviewed and updated as appropriate: allergies, current medications, past family history, past medical history, past social history, past surgical history and problem list. Problem list updated.  See flowsheet for other program required questions.  Objective:   Vitals:   11/25/23 1324  BP: 125/86  Pulse: 70  Weight: (!) 320 lb 6.4 oz (145.3 kg)  Height: 5' 7 (1.702 m)    Physical Exam Constitutional:      Appearance: Normal appearance.  HENT:     Head: Normocephalic and atraumatic.  Pulmonary:  Effort: Pulmonary effort is normal.  Abdominal:     Palpations: Abdomen is soft.  Musculoskeletal:        General: Normal range of motion.  Skin:    General: Skin is warm and dry.  Neurological:     General: No focal deficit present.     Mental Status: She is alert.  Psychiatric:        Mood and Affect: Mood normal.        Behavior: Behavior normal.     Assessment and Plan:  Ebbie Sorenson is a 23 y.o. female presenting to the [facility] Department for an initial annual  wellness/contraceptive visit   1. Family planning (Primary)  Contraception counseling: Reviewed options based on patient desire and reproductive life plan. Patient is interested in Oral Contraceptive. This was provided to the patient today.   Risks, benefits, and typical effectiveness rates were reviewed.  Questions were answered.  Written information was also given to the patient to review.    The patient will follow up in  1 years for surveillance.  The patient was told to call with any further questions, or with any concerns about this method of contraception.  Emphasized use of condoms 100% of the time for STI prevention.  Educated on ECP and assessed for need of ECP. Patient reported Unprotected sex within past 72 hours.  Reviewed options and patient desired Ella  (Ulipristal)   - Pregnancy, urine - norethindrone  (ORTHO MICRONOR ) 0.35 MG tablet; Take 1 tablet (0.35 mg total) by mouth daily.  Dispense: 28 tablet; Refill: 12 - ulipristal acetate  (ELLA ) 30 MG tablet; Take 1 tablet (30 mg total) by mouth once for 1 dose.  Dispense: 1 tablet; Refill: 0  2. Screening for venereal disease  - Chlamydia/Gonorrhea [facility] - HIV [place] [facility] - Syphilis Serology, [facility] - WET PREP FOR TRICH, YEAST, CLUE  3. Well woman exam (no gynecological exam) CBE not indicated until 25 per ACOG guidelines -pap smear UTD, next due in 10/2024 -pt reports some depression when on Nexplanon , per chart review son passed away in [id number] at 46 month old-- accepts Amanda's card today -reports hair loss and rapid weight gain in the last year of 130#- strongly encouraged to see PCP for regular lab work for thyroid   No follow-ups on file.  No future appointments.  Verneta Bers, [place]

## 2023-11-25 NOTE — Progress Notes (Signed)
Pt is here for family planning visit.  Family planning packet reviewed and given to pt.  Wet prep results reviewed, no treatment required per standing orders. Condoms given.  Gaspar Garbe, RN

## 2023-12-07 ENCOUNTER — Telehealth: Payer: Self-pay

## 2023-12-07 NOTE — Telephone Encounter (Signed)
 LM for pt to return my call.  Berdie Ogren, RN

## 2023-12-09 ENCOUNTER — Telehealth: Payer: Self-pay

## 2023-12-09 ENCOUNTER — Ambulatory Visit: Payer: Medicaid Other

## 2023-12-09 DIAGNOSIS — A749 Chlamydial infection, unspecified: Secondary | ICD-10-CM

## 2023-12-09 MED ORDER — AZITHROMYCIN 500 MG PO TABS
1000.0000 mg | ORAL_TABLET | Freq: Once | ORAL | Status: AC
  Administered 2023-12-09: 1000 mg via ORAL

## 2023-12-09 NOTE — Telephone Encounter (Signed)
PW verified.  Pt notified of positive Chlamydia results.  Treatment appointment made for 1/23 @ 1:00 pm.  Berdie Ogren, RN

## 2023-12-09 NOTE — Progress Notes (Signed)
Pt is here for treatment of Chlamydia. The patient was dispensed Azithromycin 1000 mg  today. I provided counseling today regarding the medication. We discussed the medication, the side effects and when to call clinic. Patient given the opportunity to ask questions. Questions answered.  Condoms given.  Berdie Ogren, RN

## 2023-12-14 NOTE — Addendum Note (Signed)
Addended by: Berdie Ogren on: 12/14/2023 11:28 AM   Modules accepted: Orders

## 2024-01-10 ENCOUNTER — Ambulatory Visit: Payer: Medicaid Other
